# Patient Record
Sex: Female | Born: 1960 | Race: Black or African American | Hispanic: No | Marital: Single | Smoking: Former smoker
Health system: Southern US, Community
[De-identification: ages and names within clinical notes are randomized; demographics above are authoritative.]

## PROBLEM LIST (undated history)

## (undated) DIAGNOSIS — J449 Chronic obstructive pulmonary disease, unspecified: Secondary | ICD-10-CM

## (undated) DIAGNOSIS — E119 Type 2 diabetes mellitus without complications: Secondary | ICD-10-CM

## (undated) DIAGNOSIS — K2100 Gastro-esophageal reflux disease with esophagitis, without bleeding: Secondary | ICD-10-CM

## (undated) DIAGNOSIS — E7800 Pure hypercholesterolemia, unspecified: Secondary | ICD-10-CM

## (undated) DIAGNOSIS — C3490 Malignant neoplasm of unspecified part of unspecified bronchus or lung: Secondary | ICD-10-CM

## (undated) DIAGNOSIS — I10 Essential (primary) hypertension: Secondary | ICD-10-CM

## (undated) DIAGNOSIS — I509 Heart failure, unspecified: Secondary | ICD-10-CM

## (undated) DIAGNOSIS — J45909 Unspecified asthma, uncomplicated: Secondary | ICD-10-CM

---

## 2003-04-02 ENCOUNTER — Emergency Department (HOSPITAL_COMMUNITY): Admission: EM | Admit: 2003-04-02 | Discharge: 2003-04-02 | Payer: Self-pay | Admitting: Emergency Medicine

## 2004-12-19 ENCOUNTER — Ambulatory Visit: Payer: Self-pay | Admitting: Obstetrics and Gynecology

## 2006-08-28 ENCOUNTER — Ambulatory Visit: Payer: Self-pay | Admitting: Family Medicine

## 2007-09-10 ENCOUNTER — Ambulatory Visit: Payer: Self-pay | Admitting: Family Medicine

## 2007-09-20 ENCOUNTER — Ambulatory Visit: Payer: Self-pay | Admitting: Internal Medicine

## 2007-09-28 ENCOUNTER — Other Ambulatory Visit: Payer: Self-pay

## 2007-09-28 ENCOUNTER — Inpatient Hospital Stay: Payer: Self-pay | Admitting: Internal Medicine

## 2007-10-21 ENCOUNTER — Ambulatory Visit: Payer: Self-pay | Admitting: Internal Medicine

## 2011-04-08 ENCOUNTER — Ambulatory Visit: Payer: Self-pay | Admitting: Unknown Physician Specialty

## 2011-04-10 LAB — PATHOLOGY REPORT

## 2012-10-30 ENCOUNTER — Ambulatory Visit: Payer: Self-pay | Admitting: Family Medicine

## 2012-12-12 ENCOUNTER — Emergency Department: Payer: Self-pay | Admitting: Emergency Medicine

## 2012-12-12 LAB — BASIC METABOLIC PANEL
Anion Gap: 4 — ABNORMAL LOW (ref 7–16)
BUN: 8 mg/dL (ref 7–18)
Chloride: 103 mmol/L (ref 98–107)
Co2: 31 mmol/L (ref 21–32)
Creatinine: 0.92 mg/dL (ref 0.60–1.30)
EGFR (Non-African Amer.): >60
Glucose: 164 mg/dL — ABNORMAL HIGH (ref 65–99)
Sodium: 138 mmol/L (ref 136–145)

## 2012-12-12 LAB — CBC
HCT: 43.5 % (ref 35.0–47.0)
HGB: 14.1 g/dL (ref 12.0–16.0)
MCHC: 32.3 g/dL (ref 32.0–36.0)
Platelet: 274 10*3/uL (ref 150–440)
RBC: 5.11 10*6/uL (ref 3.80–5.20)
RDW: 15.1 % — ABNORMAL HIGH (ref 11.5–14.5)
WBC: 9.5 10*3/uL (ref 3.6–11.0)

## 2012-12-12 LAB — TROPONIN I: Troponin-I: <0.02 ng/mL

## 2013-06-10 ENCOUNTER — Inpatient Hospital Stay: Payer: Self-pay | Admitting: Internal Medicine

## 2013-06-10 LAB — COMPREHENSIVE METABOLIC PANEL
Albumin: 3.4 g/dL (ref 3.4–5.0)
Alkaline Phosphatase: 106 U/L (ref 50–136)
BUN: 16 mg/dL (ref 7–18)
Bilirubin,Total: 0.4 mg/dL (ref 0.2–1.0)
Chloride: 101 mmol/L (ref 98–107)
Co2: 32 mmol/L (ref 21–32)
Creatinine: 0.83 mg/dL (ref 0.60–1.30)
EGFR (African American): >60
Glucose: 123 mg/dL — ABNORMAL HIGH (ref 65–99)
Osmolality: 275 (ref 275–301)
Potassium: 3.9 mmol/L (ref 3.5–5.1)
SGOT(AST): 41 U/L — ABNORMAL HIGH (ref 15–37)
SGPT (ALT): 62 U/L (ref 12–78)
Total Protein: 7.8 g/dL (ref 6.4–8.2)

## 2013-06-10 LAB — PROTIME-INR
INR: 3.6
Prothrombin Time: 34.5 secs — ABNORMAL HIGH (ref 11.5–14.7)

## 2013-06-10 LAB — CBC
HCT: 46.4 % (ref 35.0–47.0)
HGB: 14.9 g/dL (ref 12.0–16.0)
MCH: 26.8 pg (ref 26.0–34.0)
Platelet: 318 10*3/uL (ref 150–440)
RBC: 5.55 10*6/uL — ABNORMAL HIGH (ref 3.80–5.20)
RDW: 15.3 % — ABNORMAL HIGH (ref 11.5–14.5)
WBC: 9.9 10*3/uL (ref 3.6–11.0)

## 2013-06-10 LAB — TROPONIN I
Troponin-I: 0.03 ng/mL
Troponin-I: <0.02 ng/mL

## 2013-06-10 LAB — CK TOTAL AND CKMB (NOT AT ARMC)
CK, Total: 168 U/L (ref 21–215)
CK, Total: 142 U/L (ref 21–215)
CK, Total: 132 U/L (ref 21–215)
CK-MB: 6.6 ng/mL — ABNORMAL HIGH (ref 0.5–3.6)

## 2013-06-11 LAB — CBC WITH DIFFERENTIAL/PLATELET
Basophil %: 0.4 %
Eosinophil #: 0 10*3/uL (ref 0.0–0.7)
Eosinophil %: 0.1 %
HCT: 44.8 % (ref 35.0–47.0)
HGB: 14.2 g/dL (ref 12.0–16.0)
Lymphocyte #: 2.4 10*3/uL (ref 1.0–3.6)
MCH: 26.5 pg (ref 26.0–34.0)
MCV: 84 fL (ref 80–100)
Monocyte #: 0.8 x10 3/mm (ref 0.2–0.9)
Monocyte %: 6.7 %
Neutrophil %: 71.5 %
Platelet: 291 10*3/uL (ref 150–440)
RBC: 5.35 10*6/uL — ABNORMAL HIGH (ref 3.80–5.20)
RDW: 15.3 % — ABNORMAL HIGH (ref 11.5–14.5)
WBC: 11.3 10*3/uL — ABNORMAL HIGH (ref 3.6–11.0)

## 2013-06-11 LAB — LIPID PANEL
Ldl Cholesterol, Calc: 99 mg/dL (ref 0–100)
Triglycerides: 93 mg/dL (ref 0–200)
VLDL Cholesterol, Calc: 19 mg/dL (ref 5–40)

## 2013-06-11 LAB — MAGNESIUM: Magnesium: 1.7 mg/dL — ABNORMAL LOW

## 2013-06-11 LAB — PROTIME-INR
INR: 3.5
Prothrombin Time: 33.6 secs — ABNORMAL HIGH (ref 11.5–14.7)

## 2013-06-11 LAB — BASIC METABOLIC PANEL
BUN: 19 mg/dL — ABNORMAL HIGH (ref 7–18)
Calcium, Total: 8.9 mg/dL (ref 8.5–10.1)
Chloride: 97 mmol/L — ABNORMAL LOW (ref 98–107)
EGFR (African American): >60
EGFR (Non-African Amer.): >60
Osmolality: 276 (ref 275–301)

## 2013-06-11 LAB — HEMOGLOBIN A1C: Hemoglobin A1C: 8 % — ABNORMAL HIGH (ref 4.2–6.3)

## 2013-06-12 LAB — PROTIME-INR
INR: 2.1
Prothrombin Time: 23.2 secs — ABNORMAL HIGH (ref 11.5–14.7)

## 2013-06-13 LAB — PROTIME-INR: Prothrombin Time: 18.6 secs — ABNORMAL HIGH (ref 11.5–14.7)

## 2014-10-23 ENCOUNTER — Emergency Department
Admit: 2014-10-23 | Disposition: A | Discharge status: Other Acute Inpatient Hospital | Payer: Self-pay | Admitting: Emergency Medicine

## 2014-10-23 LAB — BASIC METABOLIC PANEL
ANION GAP: 14 (ref 7–16)
BUN: 15 mg/dL
CALCIUM: 9.8 mg/dL
CHLORIDE: 94 mmol/L — AB
CO2: 26 mmol/L
Creatinine: 0.84 mg/dL
EGFR (African American): >60
GLUCOSE: 232 mg/dL — AB
Potassium: 4.9 mmol/L
Sodium: 134 mmol/L — ABNORMAL LOW

## 2014-10-23 LAB — APTT: Activated PTT: 58.7 secs — ABNORMAL HIGH (ref 23.6–35.9)

## 2014-10-23 LAB — CBC
HCT: 45.9 % (ref 35.0–47.0)
HGB: 14.4 g/dL (ref 12.0–16.0)
MCH: 28 pg (ref 26.0–34.0)
MCHC: 31.3 g/dL — ABNORMAL LOW (ref 32.0–36.0)
MCV: 90 fL (ref 80–100)
PLATELETS: 306 10*3/uL (ref 150–440)
RBC: 5.13 10*6/uL (ref 3.80–5.20)
RDW: 14.2 % (ref 11.5–14.5)
WBC: 15.4 10*3/uL — ABNORMAL HIGH (ref 3.6–11.0)

## 2014-10-23 LAB — URINALYSIS, COMPLETE
BACTERIA: NONE SEEN
Bilirubin,UR: NEGATIVE
Blood: NEGATIVE
Glucose,UR: 150 mg/dL (ref 0–75)
Ketone: NEGATIVE
Leukocyte Esterase: NEGATIVE
NITRITE: NEGATIVE
Ph: 5 (ref 4.5–8.0)
Specific Gravity: 1.03 (ref 1.003–1.030)

## 2014-10-23 LAB — PROTIME-INR
INR: 3
PROTHROMBIN TIME: 31 s — AB

## 2014-10-23 LAB — LIPASE, BLOOD: Lipase: 49 U/L

## 2014-10-23 LAB — TROPONIN I: Troponin-I: 0.03 ng/mL

## 2014-10-23 LAB — HEPATIC FUNCTION PANEL A (ARMC)
Albumin: 3.9 g/dL
Alkaline Phosphatase: 116 U/L
Bilirubin, Direct: 0.1 mg/dL
SGOT(AST): 42 U/L — ABNORMAL HIGH
SGPT (ALT): 35 U/L
Total Protein: 9.2 g/dL — ABNORMAL HIGH

## 2014-10-25 LAB — URINE CULTURE

## 2014-10-28 LAB — CULTURE, BLOOD (SINGLE)

## 2014-11-11 NOTE — Discharge Summary (Signed)
PATIENT NAME:  Laura Booth, Laura Booth MR#:  [id number] DATE OF BIRTH:  02-02-1961  DATE OF ADMISSION:  06/10/2013 DATE OF DISCHARGE:  06/13/2013  PRIMARY CARE PHYSICIAN: Dr. Lovie Macadamia.   DISCHARGE DIAGNOSES: 1.  Chronic obstructive pulmonary disease exacerbation.  2.  Acute on chronic congestive heart failure, systolic dysfunction with ejection fraction 30% to 35%.  3.  Accelerated hypertension.   CONDITION: Stable.   CODE STATUS: Full code.   HOME MEDICATIONS: Please refer to the [facility] discharge instruction medication reconciliation list.   DIET: Low-sodium, low-fat diet.   ACTIVITY: As tolerated.   FOLLOW-UP CARE: Follow with PCP within 1 to 2 weeks. Also, the patient needs to follow up with Dr. Nehemiah Massed within 1 to 2 weeks for congestive heart failure.   REASON FOR ADMISSION: Shortness of breath.   HOSPITAL COURSE: The patient is a 54 year old African American female with a history of chronic obstructive pulmonary disease, hypertension, congestive heart failure, who presented to the ED for shortness of breath. In addition, the patient's blood pressure was high at 220/130, oxygen saturation 91% in room air. For detailed history and physical examination, please refer to the admission note dictated by Dr. Vianne Bulls. On admission date, laboratory data as follows: WBC 9.9, hemoglobin 14.9, platelets 318. Electrolytes normal, BUN 16, creatinine 0.82. Chest x-ray showed mild acute infiltrate with no failure signs. BNP 3302.   ASSESSMENT: 1.  Chronic obstructive pulmonary disease exacerbation. The patient has been treated with Rocephin and Zithromax, Solu-Medrol, oxygen, Spiriva. The patient's symptoms have much improved. She has no complaints. Physical examination only showed bilateral mild wheezing. The patient's vital signs are stable.  2.  Mild acute systolic congestive heart failure. The patient's ejection fraction is 25% to 35%. Patient has been treated with Lasix, lisinopril and Lopressor. The  patient has no leg edema. Her symptoms have much improved.  3.  History of the left ventricular thrombosis. The patient is on Coumadin. INR is therapeutic.  4.  Accelerated hypertension secondary to respiratory distress. Patient's blood pressure has been under control after admission with lisinopril, Lasix and Lopressor.  5.  The patient has no complaints.   PHYSICAL EXAMINATION: Unremarkable.   The patient is clinically stable and will be discharged to home today. I discussed the patient's discharge plan with the patient and nurse.   TIME SPENT: About 36 minutes.    ____________________________ Demetrios Loll, MD qc:cg D: 06/13/2013 13:48:12 ET T: 06/14/2013 01:18:38 ET JOB#: [id number]  cc: Demetrios Loll, MD, <Dictator> Demetrios Loll MD ELECTRONICALLY SIGNED 06/14/2013 15:22

## 2014-11-11 NOTE — H&P (Signed)
PATIENT NAME:  Laura Booth, Laura Booth MR#:  [id number] DATE OF BIRTH:  [birth year]  DATE OF ADMISSION:  06/10/2013  PRIMARY DOCTOR: Dr. Lovie Macadamia.   EMERGENCY ROOM PHYSICIAN: Dr. Winfred Leeds.   CHIEF COMPLAINT: Shortness of breath.   HISTORY OF PRESENT ILLNESS: The patient is a 54 year old female patient who came in because of shortness of breath. The patient is treated for bronchitis with doxycycline and prednisone. The patient went to urgent care on Saturday and was given doxycycline and Medrol Dosepak. The patient taking doxycycline 100 mg p.o. b.i.d. since Saturday.   Her symptoms of shortness of breath have gotten worse this morning associated with cough, so she came to Emergency Room. In the ER, the patient's blood pressure was initially 220/130. O2 saturations were 91% on room air. The patient received a series of nebulizer treatments in the ER, and we are asked to admit the patient because of COPD exacerbation and possible CHF exacerbation.   The patient was seen at the bedside, complains of shortness of breath since Saturday, getting worse despite the antibiotics, and also has dry cough, unable to get the phlegm out and the patient denies any exertional dyspnea. Denies any chest pain. No nausea. No vomiting. No abdominal pain. The patient's O2 sats dropped to 84% on room air when she went to use the bathroom, then quickly went up to 94% on 2 liters.   The patient also received a dose of Lasix 40 mg IV and feels better now.   PAST MEDICAL HISTORY: Significant for a history of hypertension, COPD, seasonal allergies, history of left ventricular thrombus, GERD, and the patient had diagnosis of left ventricular thrombus in 2009; since then, she is on Coumadin for that. Follows up with Dr. Nehemiah Massed. No history of diabetes. No thyroid problems.   ALLERGIES: NO ALLERGIES EXCEPT SEASONAL ALLERGIES.   SOCIAL HISTORY: Smoking for over 20 years, and she is in the process of quitting, but she now smokes  about 5 cigarettes a day. Occasional alcohol, no drugs. Works at [employer]   FAMILY HISTORY: The mother had died of diabetic complications at age 44. The father died of pneumonia at age 64. He also had heart attack.   MEDICATIONS:  1.  Advair Diskus 250/50 one puff b.i.d. 2.  Albuterol inhalation 4 times daily.  3.  Allegra 180 mg p.o. daily.  4.  Deltasone taper pack.  5.  Doxycycline 100 mg p.o. b.i.d. started on Saturday, that is, November 15.  6.  Lisinopril 10 mg p.o. b.i.d. 7.  Flonase 50 mcg 2 sprays once a day.  8.  Omeprazole 20 mg p.o. daily.  9.  Singulair 10 mg p.o. daily.  10.  Spiriva 18 mcg inhalation daily.  11.  Coumadin 5 mg on Wednesday and Sunday and 7.5 mg the rest of the days.   PAST SURGICAL HISTORY: None.   REVIEW OF SYSTEMS: The patient says she gained some weight recently after starting steroids. No fever. No chills. No night sweats.  EYES: Wears glasses. No eye pain. No eye strain.  ENT: No hearing loss. No epistaxis. No difficulty swallowing. Does have some hoarse voice.  CARDIOVASCULAR: No chest pain. No palpitations.  RESPIRATORY:  Does have some cough and shortness of breath. Denies any hemoptysis.  GENITOURINARY: No dysuria.  GASTROINTESTINAL: No nausea. No vomiting. No abdominal pain. No GERD. No constipation. No diarrhea.  ENDOCRINE: No polyuria or nocturia.  HEMATOLOGIC: No anemia.  INTEGUMENTARY: No skin rashes.  MUSCULOSKELETAL: No joint pains.  NEUROLOGIC: No numbness  or weakness.  PSYCHIATRIC: No anxiety or insomnia.   PHYSICAL EXAMINATION:  VITAL SIGNS: Temperature 98.6, heart rate 113, blood pressure 220/130 initially and repeat blood pressure during my visit 166/86 and heart rate 129. Sats 94% on 2 liters, dropped to 84 on exertion on room air.  GENERAL: An obese female not in distress, answering questions appropriately.  HEENT: Head normocephalic, atraumatic.  EYES: Pupils equally reacting to light. No conjunctival pallor. No scleral  icterus. Extraocular movements intact.  NOSE: No turbinate hypertrophy. No lesions.  EARS: No tympanic membrane, congestion. No drainage.  MOUTH: No lesions. No exudates.  NECK: Supple. No JVD. No carotid bruit. Normal range of motion.  RESPIRATORY: Good respiratory effort. The patient has bilateral expiratory wheezing present in all lung fields. Not using accessory muscles for respiration.  CARDIOVASCULAR: S1, S2 regular, tachycardic. The patient has no murmurs, and carotid pulse, femoral pulse, pedal pulse intact. No peripheral edema.  GASTROINTESTINAL: Abdomen is soft, obese.  ABDOMEN: Bowel sounds present, nontender, nondistended. No organomegaly.  MUSCULOSKELETAL: Able to move all extremities equally.  SKIN: Warm and moist.  NEUROLOGIC: Cranial nerves II through XII intact. Power 5/5 in upper and lower extremities. Sensation intact. DTRs 2+ bilaterally.  PSYCHIATRIC: Mood and affect are within normal limits.   LABORATORY DATA: WBC 9.9, hemoglobin 14.9, hematocrit 46.4, platelets 318. Electrolytes: Sodium 136, potassium 3.9, chloride 101, bicarb 32, BUN 16, creatinine 0.82, glucose 123. LFTs within normal limits.   Chest x-ray shows mild acute infiltrate and no failure signs.   The patient's troponin is slightly up at 0.03. BNP 3302.   EKG shows multiple PVCs with sinus rhythm, 98 beats per minute. The patient has sinus tachycardia initially and has some PVCs. Rate 98 beats per minute. No ST-T changes.   ASSESSMENT AND PLAN:  1.  The patient is a 54 year old female patient with shortness of breath and hypoxia on room air, symptoms of chronic obstructive pulmonary disease exacerbation admitted to hospitalist service. Continue Rocephin and Zithromax. The patient failed outpatient treatment. Continue Rocephin and Zithromax, IV Solu-Medrol, continue oxygen. As she is already on Spiriva, continue that and stressed about quitting smoking. She said she is in the process. Counseled about 5  minutes, offered assistance.   2.  Mild congestive heart failure exacerbation. The patient had history of left ventricular thrombus before and she is on Coumadin for that. Obtain an echocardiogram. At this time, continue aspirin and also ACE inhibitors along with Coumadin and I have started on small dose Lasix and she is tachycardic and hypertensive on admission. Symptoms are likely related to respiratory distress. We are going to continue small-dose beta blockers and replace magnesium.   3.  Premature ventricular contractions due respiratory distress. Continue to monitor on telemetry.   4.  Accelerated hypertension, likely secondary to respiratory distress from chronic obstructive pulmonary disease flare. The patient will have lisinopril along with Lasix and metoprolol that I have added and will monitor the blood pressure response on telemetry.   5.  Seasonal allergies. Continue Singulair and Flonase and Allegra.   6.  Gastrointestinal prophylaxis and the patient is already on Coumadin for deep vein thrombosis prophylaxis. Will consult cardiology for mildly elevated troponins and also possible congestive heart failure exacerbation.  ____________________________ Epifanio Lesches, MD sk:np D: 06/10/2013 13:35:00 ET T: 06/10/2013 15:00:21 ET JOB#: [id number]  cc: Epifanio Lesches, MD, <Dictator> Epifanio Lesches MD ELECTRONICALLY SIGNED 06/30/2013 22:36

## 2015-11-02 ENCOUNTER — Other Ambulatory Visit: Payer: Self-pay | Admitting: Family Medicine

## 2015-11-02 DIAGNOSIS — Z1231 Encounter for screening mammogram for malignant neoplasm of breast: Secondary | ICD-10-CM

## 2015-11-15 ENCOUNTER — Ambulatory Visit: Payer: Self-pay

## 2016-01-25 ENCOUNTER — Ambulatory Visit
Admission: RE | Admit: 2016-01-25 | Discharge: 2016-01-25 | Disposition: A | Discharge status: Home / Self Care | Payer: Managed Care, Other (non HMO) | Source: Ambulatory Visit | Attending: Family Medicine | Admitting: Family Medicine

## 2016-01-25 DIAGNOSIS — Z1231 Encounter for screening mammogram for malignant neoplasm of breast: Secondary | ICD-10-CM | POA: Diagnosis present

## 2017-09-19 ENCOUNTER — Other Ambulatory Visit: Payer: Self-pay

## 2017-09-19 ENCOUNTER — Inpatient Hospital Stay
Admission: EM | Admit: 2017-09-19 | Discharge: 2017-09-22 | DRG: 871 | Disposition: A | Discharge status: Home / Self Care | Payer: Commercial Managed Care - PPO | Attending: Internal Medicine | Admitting: Internal Medicine

## 2017-09-19 ENCOUNTER — Encounter: Payer: Self-pay | Admitting: Emergency Medicine

## 2017-09-19 ENCOUNTER — Emergency Department: Payer: Commercial Managed Care - PPO

## 2017-09-19 DIAGNOSIS — Z7901 Long term (current) use of anticoagulants: Secondary | ICD-10-CM | POA: Diagnosis not present

## 2017-09-19 DIAGNOSIS — R0602 Shortness of breath: Secondary | ICD-10-CM

## 2017-09-19 DIAGNOSIS — J181 Lobar pneumonia, unspecified organism: Secondary | ICD-10-CM | POA: Diagnosis present

## 2017-09-19 DIAGNOSIS — F17200 Nicotine dependence, unspecified, uncomplicated: Secondary | ICD-10-CM | POA: Diagnosis present

## 2017-09-19 DIAGNOSIS — E119 Type 2 diabetes mellitus without complications: Secondary | ICD-10-CM | POA: Diagnosis present

## 2017-09-19 DIAGNOSIS — Z7984 Long term (current) use of oral hypoglycemic drugs: Secondary | ICD-10-CM | POA: Diagnosis not present

## 2017-09-19 DIAGNOSIS — J9601 Acute respiratory failure with hypoxia: Secondary | ICD-10-CM | POA: Diagnosis present

## 2017-09-19 DIAGNOSIS — A419 Sepsis, unspecified organism: Secondary | ICD-10-CM | POA: Diagnosis not present

## 2017-09-19 DIAGNOSIS — Z79899 Other long term (current) drug therapy: Secondary | ICD-10-CM

## 2017-09-19 DIAGNOSIS — I509 Heart failure, unspecified: Secondary | ICD-10-CM | POA: Diagnosis present

## 2017-09-19 DIAGNOSIS — J189 Pneumonia, unspecified organism: Secondary | ICD-10-CM

## 2017-09-19 DIAGNOSIS — E7800 Pure hypercholesterolemia, unspecified: Secondary | ICD-10-CM | POA: Diagnosis present

## 2017-09-19 DIAGNOSIS — I110 Hypertensive heart disease with heart failure: Secondary | ICD-10-CM | POA: Diagnosis present

## 2017-09-19 DIAGNOSIS — J441 Chronic obstructive pulmonary disease with (acute) exacerbation: Secondary | ICD-10-CM | POA: Diagnosis present

## 2017-09-19 DIAGNOSIS — Z888 Allergy status to other drugs, medicaments and biological substances status: Secondary | ICD-10-CM | POA: Diagnosis not present

## 2017-09-19 DIAGNOSIS — J440 Chronic obstructive pulmonary disease with acute lower respiratory infection: Secondary | ICD-10-CM | POA: Diagnosis present

## 2017-09-19 DIAGNOSIS — R0902 Hypoxemia: Secondary | ICD-10-CM

## 2017-09-19 DIAGNOSIS — K210 Gastro-esophageal reflux disease with esophagitis: Secondary | ICD-10-CM | POA: Diagnosis present

## 2017-09-19 HISTORY — DX: Gastro-esophageal reflux disease with esophagitis: K21.0

## 2017-09-19 HISTORY — DX: Chronic obstructive pulmonary disease, unspecified: J44.9

## 2017-09-19 HISTORY — DX: Heart failure, unspecified: I50.9

## 2017-09-19 HISTORY — DX: Pure hypercholesterolemia, unspecified: E78.00

## 2017-09-19 HISTORY — DX: Gastro-esophageal reflux disease with esophagitis, without bleeding: K21.00

## 2017-09-19 HISTORY — DX: Essential (primary) hypertension: I10

## 2017-09-19 HISTORY — DX: Type 2 diabetes mellitus without complications: E11.9

## 2017-09-19 LAB — BASIC METABOLIC PANEL
Anion gap: 13 (ref 5–15)
BUN: 23 mg/dL — ABNORMAL HIGH (ref 6–20)
CO2: 23 mmol/L (ref 22–32)
Calcium: 9.3 mg/dL (ref 8.9–10.3)
Chloride: 96 mmol/L — ABNORMAL LOW (ref 101–111)
Creatinine, Ser: 1.11 mg/dL — ABNORMAL HIGH (ref 0.44–1.00)
GFR calc Af Amer: >60 mL/min (ref >60)
GFR calc non Af Amer: 54 mL/min — ABNORMAL LOW (ref >60)
GLUCOSE: 124 mg/dL — AB (ref 65–99)
POTASSIUM: 4.7 mmol/L (ref 3.5–5.1)
Sodium: 132 mmol/L — ABNORMAL LOW (ref 135–145)

## 2017-09-19 LAB — CBC
HCT: 38.4 % (ref 35.0–47.0)
HEMOGLOBIN: 12.4 g/dL (ref 12.0–16.0)
MCH: 28.1 pg (ref 26.0–34.0)
MCHC: 32.2 g/dL (ref 32.0–36.0)
MCV: 87.2 fL (ref 80.0–100.0)
Platelets: 379 10*3/uL (ref 150–440)
RBC: 4.41 MIL/uL (ref 3.80–5.20)
RDW: 14.2 % (ref 11.5–14.5)
WBC: 12.8 10*3/uL — ABNORMAL HIGH (ref 3.6–11.0)

## 2017-09-19 LAB — TROPONIN I: Troponin I: 0.04 ng/mL (ref <0.03)

## 2017-09-19 LAB — PROTIME-INR
INR: 6.35
PROTHROMBIN TIME: 55.5 s — AB (ref 11.4–15.2)

## 2017-09-19 LAB — INFLUENZA PANEL BY PCR (TYPE A & B)
Influenza A By PCR: NEGATIVE
Influenza B By PCR: NEGATIVE

## 2017-09-19 LAB — GLUCOSE, CAPILLARY
Glucose-Capillary: 211 mg/dL — ABNORMAL HIGH (ref 65–99)
Glucose-Capillary: 145 mg/dL — ABNORMAL HIGH (ref 65–99)

## 2017-09-19 LAB — LACTIC ACID, PLASMA: LACTIC ACID, VENOUS: 1 mmol/L (ref 0.5–1.9)

## 2017-09-19 MED ORDER — SODIUM CHLORIDE 0.9 % IV SOLN
1.0000 g | INTRAVENOUS | Status: DC
  Administered 2017-09-20 – 2017-09-21 (×2): 1 g via INTRAVENOUS
  Filled 2017-09-19 (×4): qty 10

## 2017-09-19 MED ORDER — METHYLPREDNISOLONE SODIUM SUCC 125 MG IJ SOLR: 60.0000 mg | Freq: Four times a day (QID) | INTRAMUSCULAR | Status: DC

## 2017-09-19 MED ORDER — SODIUM CHLORIDE 0.9% FLUSH: 3.0000 mL | INTRAVENOUS | Status: DC | PRN

## 2017-09-19 MED ORDER — DILTIAZEM HCL 60 MG PO TABS
60.0000 mg | ORAL_TABLET | Freq: Three times a day (TID) | ORAL | Status: DC
  Administered 2017-09-19: 17:00:00 60 mg via ORAL
  Filled 2017-09-19: qty 2
  Filled 2017-09-19: qty 1
  Filled 2017-09-19: qty 2
  Filled 2017-09-19 (×3): qty 1

## 2017-09-19 MED ORDER — LISINOPRIL 20 MG PO TABS: 40.0000 mg | ORAL_TABLET | Freq: Every day | ORAL | Status: DC

## 2017-09-19 MED ORDER — IPRATROPIUM-ALBUTEROL 0.5-2.5 (3) MG/3ML IN SOLN
3.0000 mL | RESPIRATORY_TRACT | Status: DC
  Administered 2017-09-19 – 2017-09-22 (×18): 3 mL via RESPIRATORY_TRACT
  Filled 2017-09-19 (×10): qty 3
  Filled 2017-09-19: qty 30
  Filled 2017-09-19 (×5): qty 3

## 2017-09-19 MED ORDER — SODIUM CHLORIDE 0.9 % IV SOLN
1.0000 g | Freq: Once | INTRAVENOUS | Status: AC
  Administered 2017-09-19: 1 g via INTRAVENOUS
  Filled 2017-09-19: qty 10

## 2017-09-19 MED ORDER — GUAIFENESIN-DM 100-10 MG/5ML PO SYRP
15.0000 mL | ORAL_SOLUTION | ORAL | Status: DC | PRN
  Filled 2017-09-19: qty 15

## 2017-09-19 MED ORDER — IPRATROPIUM-ALBUTEROL 0.5-2.5 (3) MG/3ML IN SOLN
3.0000 mL | Freq: Once | RESPIRATORY_TRACT | Status: AC
  Administered 2017-09-19: 3 mL via RESPIRATORY_TRACT
  Filled 2017-09-19: qty 3

## 2017-09-19 MED ORDER — METFORMIN HCL ER 750 MG PO TB24
750.0000 mg | ORAL_TABLET | Freq: Two times a day (BID) | ORAL | Status: DC
  Administered 2017-09-19 – 2017-09-22 (×6): 750 mg via ORAL
  Filled 2017-09-19 (×7): qty 1

## 2017-09-19 MED ORDER — BUDESONIDE 0.25 MG/2ML IN SUSP
0.2500 mg | Freq: Two times a day (BID) | RESPIRATORY_TRACT | Status: DC
  Administered 2017-09-19 – 2017-09-21 (×4): 0.25 mg via RESPIRATORY_TRACT
  Filled 2017-09-19 (×4): qty 2

## 2017-09-19 MED ORDER — INSULIN ASPART 100 UNIT/ML ~~LOC~~ SOLN
0.0000 [IU] | Freq: Three times a day (TID) | SUBCUTANEOUS | Status: DC
  Administered 2017-09-19 – 2017-09-20 (×2): 3 [IU] via SUBCUTANEOUS
  Administered 2017-09-20 (×2): 1 [IU] via SUBCUTANEOUS
  Administered 2017-09-21: 20:00:00 3 [IU] via SUBCUTANEOUS
  Administered 2017-09-21: 5 [IU] via SUBCUTANEOUS
  Administered 2017-09-21: 10:00:00 1 [IU] via SUBCUTANEOUS
  Administered 2017-09-21: 18:00:00 2 [IU] via SUBCUTANEOUS
  Administered 2017-09-22: 13:00:00 3 [IU] via SUBCUTANEOUS
  Administered 2017-09-22: 08:00:00 1 [IU] via SUBCUTANEOUS
  Filled 2017-09-19 (×11): qty 1

## 2017-09-19 MED ORDER — ORAL CARE MOUTH RINSE
15.0000 mL | Freq: Two times a day (BID) | OROMUCOSAL | Status: DC
  Administered 2017-09-19 – 2017-09-22 (×3): 15 mL via OROMUCOSAL

## 2017-09-19 MED ORDER — TIOTROPIUM BROMIDE MONOHYDRATE 18 MCG IN CAPS
18.0000 ug | ORAL_CAPSULE | Freq: Every day | RESPIRATORY_TRACT | Status: DC
  Administered 2017-09-20 – 2017-09-22 (×3): 18 ug via RESPIRATORY_TRACT
  Filled 2017-09-19: qty 5

## 2017-09-19 MED ORDER — MONTELUKAST SODIUM 10 MG PO TABS
10.0000 mg | ORAL_TABLET | Freq: Every day | ORAL | Status: DC
  Administered 2017-09-19 – 2017-09-22 (×4): 10 mg via ORAL
  Filled 2017-09-19 (×4): qty 1

## 2017-09-19 MED ORDER — NICOTINE 21 MG/24HR TD PT24
21.0000 mg | MEDICATED_PATCH | Freq: Every day | TRANSDERMAL | Status: DC
  Administered 2017-09-19 – 2017-09-22 (×4): 21 mg via TRANSDERMAL
  Filled 2017-09-19 (×4): qty 1

## 2017-09-19 MED ORDER — MAGNESIUM OXIDE 400 (241.3 MG) MG PO TABS
400.0000 mg | ORAL_TABLET | Freq: Two times a day (BID) | ORAL | Status: DC
  Administered 2017-09-20 – 2017-09-22 (×5): 400 mg via ORAL
  Filled 2017-09-19 (×5): qty 1

## 2017-09-19 MED ORDER — SODIUM CHLORIDE 0.9 % IV SOLN
500.0000 mg | Freq: Once | INTRAVENOUS | Status: AC
  Administered 2017-09-19: 500 mg via INTRAVENOUS
  Filled 2017-09-19: qty 500

## 2017-09-19 MED ORDER — ATORVASTATIN CALCIUM 20 MG PO TABS
40.0000 mg | ORAL_TABLET | Freq: Every day | ORAL | Status: DC
  Administered 2017-09-19 – 2017-09-22 (×4): 40 mg via ORAL
  Filled 2017-09-19 (×4): qty 2

## 2017-09-19 MED ORDER — SODIUM CHLORIDE 0.9 % IV SOLN: 250.0000 mL | INTRAVENOUS | Status: DC | PRN

## 2017-09-19 MED ORDER — LORATADINE 10 MG PO TABS
10.0000 mg | ORAL_TABLET | Freq: Every day | ORAL | Status: DC
  Administered 2017-09-20 – 2017-09-22 (×3): 10 mg via ORAL
  Filled 2017-09-19 (×4): qty 1

## 2017-09-19 MED ORDER — ACETAMINOPHEN 500 MG PO TABS
1000.0000 mg | ORAL_TABLET | Freq: Once | ORAL | Status: AC
  Administered 2017-09-19: 1000 mg via ORAL
  Filled 2017-09-19: qty 2

## 2017-09-19 MED ORDER — ACETAMINOPHEN 325 MG PO TABS
650.0000 mg | ORAL_TABLET | Freq: Four times a day (QID) | ORAL | Status: DC | PRN
  Administered 2017-09-21 – 2017-09-22 (×2): 650 mg via ORAL
  Filled 2017-09-19 (×2): qty 2

## 2017-09-19 MED ORDER — ONDANSETRON HCL 4 MG PO TABS: 4.0000 mg | ORAL_TABLET | Freq: Four times a day (QID) | ORAL | Status: DC | PRN

## 2017-09-19 MED ORDER — GUAIFENESIN ER 600 MG PO TB12: 600.0000 mg | ORAL_TABLET | Freq: Two times a day (BID) | ORAL | Status: DC

## 2017-09-19 MED ORDER — AZITHROMYCIN 500 MG IV SOLR
500.0000 mg | INTRAVENOUS | Status: DC
  Administered 2017-09-20 – 2017-09-21 (×2): 500 mg via INTRAVENOUS
  Filled 2017-09-19 (×4): qty 500

## 2017-09-19 MED ORDER — PANTOPRAZOLE SODIUM 40 MG PO TBEC
40.0000 mg | DELAYED_RELEASE_TABLET | Freq: Every day | ORAL | Status: DC
  Administered 2017-09-20 – 2017-09-22 (×3): 40 mg via ORAL
  Filled 2017-09-19 (×3): qty 1

## 2017-09-19 MED ORDER — SODIUM CHLORIDE 0.9 % IV BOLUS (SEPSIS)
1000.0000 mL | Freq: Once | INTRAVENOUS | Status: AC
  Administered 2017-09-19: 1000 mL via INTRAVENOUS

## 2017-09-19 MED ORDER — SODIUM CHLORIDE 0.9% FLUSH
3.0000 mL | Freq: Two times a day (BID) | INTRAVENOUS | Status: DC
  Administered 2017-09-19 – 2017-09-22 (×6): 3 mL via INTRAVENOUS

## 2017-09-19 MED ORDER — ONDANSETRON HCL 4 MG/2ML IJ SOLN: 4.0000 mg | Freq: Four times a day (QID) | INTRAMUSCULAR | Status: DC | PRN

## 2017-09-19 MED ORDER — ACETAMINOPHEN 650 MG RE SUPP: 650.0000 mg | Freq: Four times a day (QID) | RECTAL | Status: DC | PRN

## 2017-09-19 MED ORDER — GUAIFENESIN ER 600 MG PO TB12
600.0000 mg | ORAL_TABLET | Freq: Two times a day (BID) | ORAL | Status: DC
  Administered 2017-09-19 – 2017-09-22 (×6): 600 mg via ORAL
  Filled 2017-09-19 (×7): qty 1

## 2017-09-19 MED ORDER — METHYLPREDNISOLONE SODIUM SUCC 125 MG IJ SOLR
60.0000 mg | Freq: Four times a day (QID) | INTRAMUSCULAR | Status: DC
  Administered 2017-09-19 – 2017-09-22 (×11): 60 mg via INTRAVENOUS
  Filled 2017-09-19 (×11): qty 2

## 2017-09-19 MED ORDER — METHYLPREDNISOLONE SODIUM SUCC 125 MG IJ SOLR
125.0000 mg | Freq: Once | INTRAMUSCULAR | Status: AC
  Administered 2017-09-19: 125 mg via INTRAVENOUS
  Filled 2017-09-19: qty 2

## 2017-09-19 NOTE — ED Provider Notes (Addendum)
[facility] Emergency Department Provider Note  Time seen: 10:03 AM  I have reviewed the triage vital signs and the nursing notes.   HISTORY  Chief Complaint Chest Pain    HPI Laura Booth is a 57 y.o. female with a past medical history of CHF, COPD, diabetes, hypertension, hyperlipidemia presents to the emergency department for shortness of breath cough and congestion.  According to the patient for the past 1 month she has had a cough congestion and subjective fevers.  Saw her doctor went on a 2-week course of prednisone.  She states after she completed the prednisone she once again became very congested and short of breath.  Patient states her shortness of breath has progressively worsened over the past 1 week so she came to the emergency department for evaluation.  Upon arrival the patient has a room air saturation of 87%.  No home O2 requirement or prescription.  Patient states very mild chest tightness, denies any "pain."  Largely negative review of systems otherwise.   Past Medical History:  Diagnosis Date  . CHF (congestive heart failure) ([place])   . COPD (chronic obstructive pulmonary disease) ([place])   . Diabetes mellitus without complication ([place])   . Hypercholesteremia   . Hypertension   . Reflux esophagitis     There are no active problems to display for this patient.   History reviewed. No pertinent surgical history.  Prior to Admission medications   Not on File    Allergies  Allergen Reactions  . Other     Beta blocker     History reviewed. No pertinent family history.  Social History Social History   Tobacco Use  . Smoking status: Current Every Day Smoker  . Smokeless tobacco: Never Used  Substance Use Topics  . Alcohol use: No    Frequency: Never  . Drug use: No    Review of Systems Constitutional: Subjective fever/chills Eyes: Negative for visual complaints ENT: Congestion times 1 month Cardiovascular: Negative for  chest pain.  Occasional chest tightness Respiratory: Moderate shortness of breath, worse of the past 1 week Gastrointestinal: Negative for abdominal pain, vomiting Genitourinary: Negative for urinary compaints Musculoskeletal: Negative for leg pain or swelling Skin: Negative for skin complaints  Neurological: Negative for headache All other ROS negative  ____________________________________________   PHYSICAL EXAM:  VITAL SIGNS: ED Triage Vitals  Enc Vitals Group     BP 09/19/17 0952 128/85     Pulse Rate 09/19/17 0947 (!) 120     Resp 09/19/17 0952 (!) 24     Temp 09/19/17 0952 98.2 F (36.8 C)     Temp Source 09/19/17 0952 Oral     SpO2 09/19/17 0947 (!) 87 %     Weight 09/19/17 0948 185 lb (83.9 kg)     Height 09/19/17 0948 5\' 4"  (1.626 m)     Head Circumference --      Peak Flow --      Pain Score 09/19/17 0947 4     Pain Loc --      Pain Edu? --      Excl. in [place]? --    Constitutional: Alert and oriented. Well appearing and in no distress. Eyes: Normal exam ENT   Head: Normocephalic and atraumatic.   Mouth/Throat: Mucous membranes are moist. Cardiovascular: Regular rhythm, rate around 120 bpm.  No obvious murmur. Respiratory: Mild tachypnea, mild to moderate expiratory wheeze auscultated bilaterally.  No rales or rhonchi. Gastrointestinal: Soft and nontender. No distention.  Musculoskeletal: Nontender with normal range of motion in all extremities. No lower extremity tenderness or edema. Neurologic:  Normal speech and language. No gross focal neurologic deficits  Skin:  Skin is warm, dry and intact.  Psychiatric: Mood and affect are normal.  ____________________________________________    EKG  EKG reviewed and interpreted by myself shows sinus tachycardia 120 bpm with a narrow QRS, left axis deviation, largely normal intervals, nonspecific ST changes.  No ST elevation.  ____________________________________________    RADIOLOGY  X-ray consistent  with left upper lobe pneumonia.  ____________________________________________   INITIAL IMPRESSION / ASSESSMENT AND PLAN / ED COURSE  Pertinent labs & imaging results that were available during my care of the patient were reviewed by me and considered in my medical decision making (see chart for details).  Patient presents to the emergency department for worsening shortness of breath cough and congestion.  Differential would include URI, bronchitis, pneumonia, COPD exacerbation, CHF exacerbation.  We will check labs, chest x-ray, cardiac panel.  We will treat with DuoNeb's, Solu-Medrol and continue to closely monitor in the emergency department while awaiting results.  Patient currently satting 92-94% on 2 L but no home O2 requirement.  We will reassess after breathing treatments.  X-ray consistent with left upper lobe pneumonia.  Patient remains tachycardic around 120 bpm.  Remains hypoxic in the upper 80s on room air, 92% currently on 2 L.  White blood cell count has resulted at 12,800.  Given the elevated white blood cell count, tachycardia, hypoxia and left upper lobe pneumonia I have ordered sepsis protocols, started community-acquired pneumonia antibiotics as well as IV hydration.  Patient will require admission to the hospital once the remainder of her lab work has resulted.  I discussed plan of care with the patient who is agreeable.  Troponin 0.04, could be demand ischemia related.  Influenza negative.  Patient admitted to the hospitalist service.  CRITICAL CARE Performed by: Harvest Dark   Total critical care time: 30 minutes  Critical care time was exclusive of separately billable procedures and treating other patients.  Critical care was necessary to treat or prevent imminent or life-threatening deterioration.  Critical care was time spent personally by me on the following activities: development of treatment plan with patient and/or surrogate as well as nursing,  discussions with consultants, evaluation of patient's response to treatment, examination of patient, obtaining history from patient or surrogate, ordering and performing treatments and interventions, ordering and review of laboratory studies, ordering and review of radiographic studies, pulse oximetry and re-evaluation of patient's condition.   ____________________________________________   FINAL CLINICAL IMPRESSION(S) / ED DIAGNOSES  Hypoxia Dyspnea Sepsis Pneumonia   Harvest Dark, MD 09/19/17 1237    Harvest Dark, MD 09/19/17 1237

## 2017-09-19 NOTE — ED Notes (Signed)
First nurse note  Presents from [facility] with c/p and SOB

## 2017-09-19 NOTE — H&P (Addendum)
[facility] at [facility] NAME: Laura Booth    MR#:  [id number]  DATE OF BIRTH:  1960-08-08  DATE OF ADMISSION:  09/19/2017  PRIMARY CARE PHYSICIAN: Patient, No Pcp Per   REQUESTING/REFERRING PHYSICIAN: Wyman Songster MD  CHIEF COMPLAINT:   Chief Complaint  Patient presents with  . Chest Pain    HISTORY OF PRESENT ILLNESS: Laura Booth  is a 57 y.o. female with a known history of congestive heart failure, COPD, diabetes type 2, hypertension, hyper lipidemia and GERD who also states that she has a history of cardiac thrombus presenting with shortness of breath ongoing for the past 1 month.  Patient states that she was treated with some prednisone in February but her symptoms continue to get worse.  She has been having progressive shortness of breath wheezing and coughing.  Has not had any fevers or chills.  In the ER she is noted to have left upper lobe pneumonia and new oxygen requirements. PAST MEDICAL HISTORY:   Past Medical History:  Diagnosis Date  . CHF (congestive heart failure) ([place])   . COPD (chronic obstructive pulmonary disease) ([place])   . Diabetes mellitus without complication ([place])   . Hypercholesteremia   . Hypertension   . Reflux esophagitis     PAST SURGICAL HISTORY: History reviewed. No pertinent surgical history.  SOCIAL HISTORY:  Social History   Tobacco Use  . Smoking status: Current Every Day Smoker  . Smokeless tobacco: Never Used  Substance Use Topics  . Alcohol use: No    Frequency: Never    FAMILY HISTORY: History reviewed. No pertinent family history.  DRUG ALLERGIES:  Allergies  Allergen Reactions  . Other     Beta blocker     REVIEW OF SYSTEMS:   CONSTITUTIONAL: No fever, fatigue or weakness.  EYES: No blurred or double vision.  EARS, NOSE, AND THROAT: No tinnitus or ear pain.  RESPIRATORY: Positive cough, positive shortness of breath, wheezing or hemoptysis.  CARDIOVASCULAR: No chest pain,  orthopnea, edema.  GASTROINTESTINAL: No nausea, vomiting, diarrhea or abdominal pain.  GENITOURINARY: No dysuria, hematuria.  ENDOCRINE: No polyuria, nocturia,  HEMATOLOGY: No anemia, easy bruising or bleeding SKIN: No rash or lesion. MUSCULOSKELETAL: No joint pain or arthritis.   NEUROLOGIC: No tingling, numbness, weakness.  PSYCHIATRY: No anxiety or depression.   MEDICATIONS AT HOME:  Prior to Admission medications   Medication Sig Start Date End Date Taking? Authorizing Provider  ADVAIR DISKUS 500-50 MCG/DOSE AEPB Inhale 1 puff into the lungs every 12 (twelve) hours. 08/25/17  Yes [provider]  atorvastatin (LIPITOR) 40 MG tablet Take 40 mg by mouth daily.    Yes [provider]  fexofenadine (ALLEGRA) 180 MG tablet Take 180 mg by mouth daily.   Yes [provider]  lisinopril (PRINIVIL,ZESTRIL) 40 MG tablet Take 40 mg by mouth daily. 07/23/17  Yes [provider]  magnesium oxide (MAG-OX) 400 MG tablet Take 1 tablet by mouth 2 (two) times daily. 09/04/17  Yes [provider]  metFORMIN (GLUCOPHAGE-XR) 750 MG 24 hr tablet Take 1 tablet by mouth 2 (two) times daily. 09/06/17  Yes [provider]  montelukast (SINGULAIR) 10 MG tablet Take 10 mg by mouth daily. 09/08/17  Yes [provider]  omeprazole (PRILOSEC) 20 MG capsule Take 20 mg by mouth daily.   Yes [provider]  PROAIR HFA 108 (90 Base) MCG/ACT inhaler Inhale 2 puffs into the lungs 4 (four) times daily as needed.  07/28/17  Yes [provider]  SPIRIVA HANDIHALER 18 MCG inhalation capsule Place 1 puff into inhaler and inhale daily. 08/25/17  Yes [provider]  WARFARIN SODIUM PO TAKE 10 MG BY MOUTH ON Monday AND 7.5MG  DAILY ALL OTHER DAYS   Yes [provider]      PHYSICAL EXAMINATION:   VITAL SIGNS: Blood pressure 124/77, pulse (!) 111, temperature 98.2 F (36.8 C), temperature source Oral, resp. rate 20, height 5\' 4"  (1.626 m),  weight 185 lb (83.9 kg), SpO2 93 %.  GENERAL:  57 y.o.-year-old patient lying in the bed with no acute distress.  EYES: Pupils equal, round, reactive to light and accommodation. No scleral icterus. Extraocular muscles intact.  HEENT: Head atraumatic, normocephalic. Oropharynx and nasopharynx clear.  NECK:  Supple, no jugular venous distention. No thyroid enlargement, no tenderness.  LUNGS: Bilateral wheezing throughout both lungs and accessory muscle usage CARDIOVASCULAR: S1, S2 normal. No murmurs, rubs, or gallops.  ABDOMEN: Soft, nontender, nondistended. Bowel sounds present. No organomegaly or mass.  EXTREMITIES: No pedal edema, cyanosis, or clubbing.  NEUROLOGIC: Cranial nerves II through XII are intact. Muscle strength 5/5 in all extremities. Sensation intact. Gait not checked.  PSYCHIATRIC: The patient is alert and oriented x 3.  SKIN: No obvious rash, lesion, or ulcer.   LABORATORY PANEL:   CBC Recent Labs  Lab 09/19/17 0959  WBC 12.8*  HGB 12.4  HCT 38.4  PLT 379  MCV 87.2  MCH 28.1  MCHC 32.2  RDW 14.2   ------------------------------------------------------------------------------------------------------------------  Chemistries  Recent Labs  Lab 09/19/17 0959  NA 132*  K 4.7  CL 96*  CO2 23  GLUCOSE 124*  BUN 23*  CREATININE 1.11*  CALCIUM 9.3   ------------------------------------------------------------------------------------------------------------------ estimated creatinine clearance is 58.6 mL/min (A) (by C-G formula based on SCr of 1.11 mg/dL (H)). ------------------------------------------------------------------------------------------------------------------ No results for input(s): TSH, T4TOTAL, T3FREE, THYROIDAB in the last 72 hours.  Invalid input(s): FREET3   Coagulation profile No results for input(s): INR, PROTIME in the last 168  hours. ------------------------------------------------------------------------------------------------------------------- No results for input(s): DDIMER in the last 72 hours. -------------------------------------------------------------------------------------------------------------------  Cardiac Enzymes Recent Labs  Lab 09/19/17 0959  TROPONINI 0.04*   ------------------------------------------------------------------------------------------------------------------ Invalid input(s): POCBNP  ---------------------------------------------------------------------------------------------------------------  Urinalysis    Component Value Date/Time   COLORURINE Amber 10/23/2014 1544   APPEARANCEUR Cloudy 10/23/2014 1544   LABSPEC 1.030 10/23/2014 1544   PHURINE 5.0 10/23/2014 1544   GLUCOSEU 150 mg/dL 10/23/2014 1544   HGBUR Negative 10/23/2014 1544   BILIRUBINUR Negative 10/23/2014 1544   KETONESUR Negative 10/23/2014 1544   PROTEINUR >=500 10/23/2014 1544   NITRITE Negative 10/23/2014 1544   LEUKOCYTESUR Negative 10/23/2014 1544     RADIOLOGY: Dg Chest 2 View  Result Date: 09/19/2017 CLINICAL DATA:  Shortness of breath. EXAM: CHEST  2 VIEW COMPARISON:  10/23/2014 FINDINGS: Airspace disease asymmetrically in the left upper lobe. Similar appearance of the left hilum when compared to prior and allowing for obscuration. No visible cavitation or effusion. Normal heart size. IMPRESSION: Left upper lobe airspace disease consistent with pneumonia in the appropriate clinical setting. Followup PA and lateral chest X-ray is recommended in 3-4 weeks following trial of antibiotic therapy to ensure resolution and exclude underlying malignancy. Electronically Signed   By: Monte Fantasia M.D.   On: 09/19/2017 10:24    EKG: Orders placed or performed during the hospital encounter of 09/19/17  . ED EKG within 10 minutes  . ED EKG within 10 minutes    IMPRESSION AND PLAN: Patient  is a  57 year old with history of COPD presenting with shortness of breath  1.  Acute respiratory failure due to pneumonia and COPD exasperation We will treat pneumonia with ceftriaxone and azithromycin Obtain sputum cultures  2.  Acute on chronic COPD exasperation we will treat patient with nebulizer therapy IV Solu-Medrol Place her on Pulmicort instead of Advair Place patient on mucolytic's  3.  Diabetes type 2 Continue Metformin Pl. sliding scale insulin  4 history of CHF type unknown.  Currently compensated  5.  Hyperlipidemia continue Lipitor  6.  Essential hypertension continue lisinopril  7. H/o cardiac thrombus- continue coumadin  8.  Nicotine abuse smoking cessation provided 4 min spent, recommended she stop smoking, nicotine patch  All the records are reviewed and case discussed with ED provider. Management plans discussed with the patient, family and they are in agreement.  CODE STATUS: Code Status History    This patient does not have a recorded code status. Please follow your organizational policy for patients in this situation.       TOTAL TIME TAKING CARE OF THIS PATIENT: 55 minutes.    Dustin Flock M.D on 09/19/2017 at 1:16 PM  Between 7am to 6pm - Pager - [phone]  After 6pm go to [email or web address] - password [email or web address] [facility]  Office  [phone]  CC: Primary care physician; Patient, No Pcp Per

## 2017-09-19 NOTE — Progress Notes (Addendum)
[facility] for Warfarin Indication: h/o cardiac thrombus  Allergies  Allergen Reactions  . Other Shortness Of Breath    Beta blocker     Patient Measurements: Height: 5\' 4"  (162.6 cm) Weight: 185 lb (83.9 kg) IBW/kg (Calculated) : 54.7  Vital Signs: Temp: 97.8 F (36.6 C) (03/01 1429) Temp Source: Oral (03/01 1429) BP: 133/82 (03/01 1429) Pulse Rate: 121 (03/01 1429)  Labs: Recent Labs    09/19/17 0959 09/19/17 1316  HGB 12.4  --   HCT 38.4  --   PLT 379  --   LABPROT  --  55.5*  INR  --  6.35*  CREATININE 1.11*  --   TROPONINI 0.04*  --     Estimated Creatinine Clearance: 58.6 mL/min (A) (by C-G formula based on SCr of 1.11 mg/dL (H)).   Medical History: Past Medical History:  Diagnosis Date  . CHF (congestive heart failure) ([place])   . COPD (chronic obstructive pulmonary disease) ([place])   . Diabetes mellitus without complication ([place])   . Hypercholesteremia   . Hypertension   . Reflux esophagitis    Assessment: 57 y/o F with a h/o cardiac thrombus on warfarin admitted with AECOPD. Patient taking warfarin 7.5 mg daily except 10 mg Mondays PTA.   Goal of Therapy:  INR 2-3   Assesment/Plan:  INR is elevated with no reported bleeding. Will hold warfarin and follow daily INR/CBC for now.   Ulice Dash D 09/19/2017,3:54 PM

## 2017-09-19 NOTE — Progress Notes (Signed)
   09/19/17 1815  Clinical Encounter Type  Visited With Patient and family together  Visit Type Initial  Referral From Nurse  Consult/Referral To Chaplain   Chaplain provided information regarding advanced directives.  [place]

## 2017-09-19 NOTE — ED Notes (Signed)
Dr. Kerman Passey aware of Troponin of 0.04

## 2017-09-19 NOTE — ED Notes (Signed)
Patient transported to X-ray 

## 2017-09-19 NOTE — Progress Notes (Signed)
CODE SEPSIS - PHARMACY COMMUNICATION  **Broad Spectrum Antibiotics should be administered within 1 hour of Sepsis diagnosis**  Time Code Sepsis Called/Page Received: 3/1 @ 1107  Antibiotics Ordered: Ceftriaxone 1g IV                                       Azithromycin 500mg  IV   Time of 1st antibiotic administration: 3/1 @1135   Additional action taken by pharmacy: N/A  If necessary, Name of Provider/Nurse Contacted: N/A   Pernell Dupre, PharmD, BCPS Clinical Pharmacist 09/19/2017 11:47 AM

## 2017-09-19 NOTE — ED Triage Notes (Signed)
Hx COPD without oxygen use at home and baseline sat 92 RA. sats 87 today. Mildly labored with left sided chest pain intermittent for week worse last night.

## 2017-09-19 NOTE — Progress Notes (Signed)
Ch responded to an order request. Nurses were caring for the Pt. Initital request was for CPR. Wanted AD.   09/19/17 1600  Clinical Encounter Type  Visited With Patient  Visit Type Initial  Referral From Nurse  Spiritual Encounters  Spiritual Needs Brochure

## 2017-09-20 LAB — BASIC METABOLIC PANEL
ANION GAP: 9 (ref 5–15)
BUN: 20 mg/dL (ref 6–20)
CHLORIDE: 101 mmol/L (ref 101–111)
CO2: 25 mmol/L (ref 22–32)
Calcium: 9.4 mg/dL (ref 8.9–10.3)
Creatinine, Ser: 0.69 mg/dL (ref 0.44–1.00)
GFR calc Af Amer: >60 mL/min (ref >60)
GLUCOSE: 197 mg/dL — AB (ref 65–99)
POTASSIUM: 5.5 mmol/L — AB (ref 3.5–5.1)
Sodium: 135 mmol/L (ref 135–145)

## 2017-09-20 LAB — PROTIME-INR
INR: 8.7
PROTHROMBIN TIME: 71 s — AB (ref 11.4–15.2)

## 2017-09-20 LAB — GLUCOSE, CAPILLARY
GLUCOSE-CAPILLARY: 122 mg/dL — AB (ref 65–99)
GLUCOSE-CAPILLARY: 227 mg/dL — AB (ref 65–99)
GLUCOSE-CAPILLARY: 147 mg/dL — AB (ref 65–99)
Glucose-Capillary: 178 mg/dL — ABNORMAL HIGH (ref 65–99)

## 2017-09-20 LAB — CBC
HCT: 37.1 % (ref 35.0–47.0)
HEMOGLOBIN: 11.9 g/dL — AB (ref 12.0–16.0)
MCH: 28 pg (ref 26.0–34.0)
MCHC: 32.1 g/dL (ref 32.0–36.0)
MCV: 87.2 fL (ref 80.0–100.0)
PLATELETS: 344 10*3/uL (ref 150–440)
RBC: 4.25 MIL/uL (ref 3.80–5.20)
RDW: 14.3 % (ref 11.5–14.5)
WBC: 14.4 10*3/uL — AB (ref 3.6–11.0)

## 2017-09-20 MED ORDER — AMLODIPINE BESYLATE 5 MG PO TABS
2.5000 mg | ORAL_TABLET | Freq: Every day | ORAL | Status: DC
  Administered 2017-09-20 – 2017-09-22 (×3): 2.5 mg via ORAL
  Filled 2017-09-20 (×3): qty 1

## 2017-09-20 NOTE — Progress Notes (Signed)
   09/20/17 1110  Clinical Encounter Type  Visited With Patient and family together  Visit Type Follow-up  Referral From Nurse  Consult/Referral To Chaplain  Spiritual Encounters  Spiritual Needs Other (Comment)   [place] followed up on a OR to complete a AD. [place] secured notary and witnesses and AD was completed and copy put in PT file and original given to PT.

## 2017-09-20 NOTE — Progress Notes (Signed)
CRITICAL VALUE ALERT  Critical Value:  INR 8.70  Date & Time Notied:  09/20/2017 0514  Provider Notified: Saundra Shelling, MD  Orders Received/Actions taken: No new orders at this time. Will continue to monitor.

## 2017-09-20 NOTE — Progress Notes (Signed)
[facility] for Warfarin Indication: h/o cardiac thrombus  Allergies  Allergen Reactions  . Other Shortness Of Breath    Beta blocker     Patient Measurements: Height: 5\' 4"  (162.6 cm) Weight: 185 lb (83.9 kg) IBW/kg (Calculated) : 54.7  Vital Signs: Temp: 98.2 F (36.8 C) (03/02 0425) Temp Source: Oral (03/02 0425) BP: 113/71 (03/02 0425) Pulse Rate: 101 (03/02 0425)  Labs: Recent Labs    09/19/17 0959 09/19/17 1316 09/20/17 0339  HGB 12.4  --  11.9*  HCT 38.4  --  37.1  PLT 379  --  344  LABPROT  --  55.5* 71.0*  INR  --  6.35* 8.70*  CREATININE 1.11*  --  0.69  TROPONINI 0.04*  --   --     Estimated Creatinine Clearance: 81.3 mL/min (by C-G formula based on SCr of 0.69 mg/dL).   Medical History: Past Medical History:  Diagnosis Date  . CHF (congestive heart failure) ([facility])   . COPD (chronic obstructive pulmonary disease) ([facility])   . Diabetes mellitus without complication ([facility])   . Hypercholesteremia   . Hypertension   . Reflux esophagitis    Assessment: 57 y/o F with a h/o cardiac thrombus on warfarin admitted with AECOPD. Patient taking warfarin 7.5 mg daily except 10 mg Mondays PTA.                           INR      Warfarin 3/1     6.35         --- 3/2     8.70         ---  Goal of Therapy:  INR 2-3   Assesment/Plan:  INR is elevated with no reported bleeding. Will hold warfarin and follow daily INR/CBC for now.   Olivia Canter, [facility] 09/20/2017,8:08 AM

## 2017-09-21 LAB — PROTIME-INR
INR: 3.86
Prothrombin Time: 37.6 seconds — ABNORMAL HIGH (ref 11.4–15.2)

## 2017-09-21 LAB — GLUCOSE, CAPILLARY
Glucose-Capillary: 150 mg/dL — ABNORMAL HIGH (ref 65–99)
Glucose-Capillary: 279 mg/dL — ABNORMAL HIGH (ref 65–99)
Glucose-Capillary: 165 mg/dL — ABNORMAL HIGH (ref 65–99)
Glucose-Capillary: 212 mg/dL — ABNORMAL HIGH (ref 65–99)

## 2017-09-21 MED ORDER — PREMIER PROTEIN SHAKE
11.0000 [oz_av] | Freq: Two times a day (BID) | ORAL | Status: DC
  Administered 2017-09-21 – 2017-09-22 (×2): 11 [oz_av] via ORAL

## 2017-09-21 MED ORDER — BUDESONIDE 0.25 MG/2ML IN SUSP
0.2500 mg | Freq: Two times a day (BID) | RESPIRATORY_TRACT | Status: DC
  Administered 2017-09-21 – 2017-09-22 (×2): 0.25 mg via RESPIRATORY_TRACT
  Filled 2017-09-21 (×2): qty 2

## 2017-09-21 NOTE — Progress Notes (Signed)
Initial Nutrition Assessment  DOCUMENTATION CODES:   Obesity unspecified  INTERVENTION:  Provide Premier Protein po BID, each supplement provides 160 kcal and 30 grams of protein.  NUTRITION DIAGNOSIS:   Inadequate oral intake related to decreased appetite as evidenced by per patient/family report.  GOAL:   Patient will meet greater than or equal to 90% of their needs  MONITOR:   PO intake, Supplement acceptance, Labs, Weight trends, I & O's  REASON FOR ASSESSMENT:   Malnutrition Screening Tool    ASSESSMENT:   57 year old female with PMHx of CHF, COPD, DM type 2, HTN, hypercholesteremia, reflux esophagitis, hx of cardiac thrombus who is admitted with acute respiratory failure due to PNA and acute exacerbation of COPD.   Met with patient at bedside. She reports she has had a decreased appetite for about one month now. She did not realize the symptoms she had been experiencing at home were from PNA. She had been feeling very weak, had some back pain, and just had not been eating well at home. She was only eating 1-2 small meals per day. When she is feeling well she eats 3 good meals daily. She does report her appetite is starting to pick back up now.  UBW was 200 lbs several years ago. She reports she has slowly lost weight to current weight of 185 lbs over several years. Unable to verify weight as patient was sitting in chair eating lunch and was not on bed.  Meal completion: 30% of dinner 3/1; on 3/2 80% of breakfast and 100% of lunch and dinner  Medications reviewed and include: Novolog 0-9 units TID, magnesium oxide 400 mg BID, metformin 750 mg BID, Solu-Medrol 60 mg Q6hrs IV, pantoprazole, azithromycin, ceftriaxone.  Labs reviewed: CBG 147-279, Potassium 5.5.  Patient does not meet criteria for malnutrition at this time.  NUTRITION - FOCUSED PHYSICAL EXAM:    Most Recent Value  Orbital Region  No depletion  Upper Arm Region  No depletion  Thoracic and Lumbar Region   No depletion  Buccal Region  No depletion  Temple Region  No depletion  Clavicle Bone Region  No depletion  Clavicle and Acromion Bone Region  No depletion  Scapular Bone Region  No depletion  Dorsal Hand  No depletion  Patellar Region  No depletion  Anterior Thigh Region  No depletion  Posterior Calf Region  No depletion  Edema (RD Assessment)  None  Hair  Reviewed  Eyes  Reviewed  Mouth  Reviewed  Skin  Reviewed  Nails  Reviewed     Diet Order:  Diet heart healthy/carb modified Room service appropriate? Yes; Fluid consistency: Thin  EDUCATION NEEDS:   No education needs have been identified at this time  Skin:  Skin Assessment: Reviewed RN Assessment  Last BM:  09/19/2017  Height:   Ht Readings from Last 1 Encounters:  09/19/17 5' 4"  (1.626 m)    Weight:   Wt Readings from Last 1 Encounters:  09/19/17 185 lb (83.9 kg)    Ideal Body Weight:  54.5 kg  BMI:  Body mass index is 31.76 kg/m.  Estimated Nutritional Needs:   Kcal:  1695-1840 (MSJ x 1.2-1.3)  Protein:  84-92 grams (1-1.1 grams/kg)  Fluid:  1.7-1.8 L/day (1 mL/kcal)  Willey Blade, MS, RD, LDN Office: [phone] Pager: [phone] After Hours/Weekend Pager: [phone]

## 2017-09-21 NOTE — Progress Notes (Signed)
[facility] for Warfarin Indication: h/o cardiac thrombus  Allergies  Allergen Reactions  . Other Shortness Of Breath    Beta blocker     Patient Measurements: Height: 5\' 4"  (162.6 cm) Weight: 185 lb (83.9 kg) IBW/kg (Calculated) : 54.7  Vital Signs: Temp: 98 F (36.7 C) (03/03 0445) Temp Source: Oral (03/03 0445) BP: 124/83 (03/03 0445) Pulse Rate: 97 (03/03 0445)  Labs: Recent Labs    09/19/17 0959 09/19/17 1316 09/20/17 0339 09/21/17 0435  HGB 12.4  --  11.9*  --   HCT 38.4  --  37.1  --   PLT 379  --  344  --   LABPROT  --  55.5* 71.0* 37.6*  INR  --  6.35* 8.70* 3.86  CREATININE 1.11*  --  0.69  --   TROPONINI 0.04*  --   --   --     Estimated Creatinine Clearance: 81.3 mL/min (by C-G formula based on SCr of 0.69 mg/dL).   Medical History: Past Medical History:  Diagnosis Date  . CHF (congestive heart failure) ([facility])   . COPD (chronic obstructive pulmonary disease) ([facility])   . Diabetes mellitus without complication ([place])   . Hypercholesteremia   . Hypertension   . Reflux esophagitis    Assessment: 57 y/o F with a h/o cardiac thrombus on warfarin admitted with AECOPD. Patient taking warfarin 7.5 mg daily except 10 mg Mondays PTA.                           INR      Warfarin 3/1     6.35         --- 3/2     8.70         --- 3/3     3.86         ---  Goal of Therapy:  INR 2-3   Assesment/Plan:  INR is elevated. Will hold warfarin and follow daily INR/CBC for now.   Zara Wendt K, [place] 09/21/2017,7:35 AM

## 2017-09-21 NOTE — Progress Notes (Signed)
[name]    MR#:  [id number]  DATE OF BIRTH:  1960-10-25  SUBJECTIVE:  CHIEF COMPLAINT:   Chief Complaint  Patient presents with  . Chest Pain     Came with complaint of chest pain, cough, shortness of breath and requiring supplemental oxygen. Feels little bit better today. Still on oxygen. Had spells of excessive coughing and hypoxia still a few times this morning.  REVIEW OF SYSTEMS:  CONSTITUTIONAL: No fever, fatigue or weakness.  EYES: No blurred or double vision.  EARS, NOSE, AND THROAT: No tinnitus or ear pain.  RESPIRATORY: Positive for cough, shortness of breath, wheezing , no hemoptysis.  CARDIOVASCULAR: No chest pain, orthopnea, edema.  GASTROINTESTINAL: No nausea, vomiting, diarrhea or abdominal pain.  GENITOURINARY: No dysuria, hematuria.  ENDOCRINE: No polyuria, nocturia,  HEMATOLOGY: No anemia, easy bruising or bleeding SKIN: No rash or lesion. MUSCULOSKELETAL: No joint pain or arthritis.   NEUROLOGIC: No tingling, numbness, weakness.  PSYCHIATRY: No anxiety or depression.   ROS  DRUG ALLERGIES:   Allergies  Allergen Reactions  . Other Shortness Of Breath    Beta blocker     VITALS:  Blood pressure 131/83, pulse (!) 102, temperature 97.7 F (36.5 C), temperature source Oral, resp. rate 16, height 5\' 4"  (1.626 m), weight 83.9 kg (185 lb), SpO2 98 %.  PHYSICAL EXAMINATION:  GENERAL:  57 y.o.-year-old patient lying in the bed with no acute distress.  EYES: Pupils equal, round, reactive to light and accommodation. No scleral icterus. Extraocular muscles intact.  HEENT: Head atraumatic, normocephalic. Oropharynx and nasopharynx clear.  NECK:  Supple, no jugular venous distention. No thyroid enlargement, no tenderness.  LUNGS: Normal breath sounds bilaterally, some wheezing, no crepitation. No use of accessory muscles of respiration. On supplemental oxygen via nasal  cannula. CARDIOVASCULAR: S1, S2 normal. No murmurs, rubs, or gallops.  ABDOMEN: Soft, nontender, nondistended. Bowel sounds present. No organomegaly or mass.  EXTREMITIES: No pedal edema, cyanosis, or clubbing.  NEUROLOGIC: Cranial nerves II through XII are intact. Muscle strength 5/5 in all extremities. Sensation intact. Gait not checked.  PSYCHIATRIC: The patient is alert and oriented x 3.  SKIN: No obvious rash, lesion, or ulcer.   Physical Exam LABORATORY PANEL:   CBC Recent Labs  Lab 09/20/17 0339  WBC 14.4*  HGB 11.9*  HCT 37.1  PLT 344   ------------------------------------------------------------------------------------------------------------------  Chemistries  Recent Labs  Lab 09/20/17 0339  NA 135  K 5.5*  CL 101  CO2 25  GLUCOSE 197*  BUN 20  CREATININE 0.69  CALCIUM 9.4   ------------------------------------------------------------------------------------------------------------------  Cardiac Enzymes Recent Labs  Lab 09/19/17 0959  TROPONINI 0.04*   ------------------------------------------------------------------------------------------------------------------  RADIOLOGY:  No results found.  ASSESSMENT AND PLAN:   Active Problems:   PNA (pneumonia)  Patient is a 57 year old with history of COPD presenting with shortness of breath  1.  Acute respiratory failure due to pneumonia and COPD exasperation  treat pneumonia with ceftriaxone and azithromycin Obtain sputum cultures   Influenza is negative, continue current therapy.  2.  Acute on chronic COPD exasperation    nebulizer therapy IV Solu-Medrol Place her on Pulmicort instead of Advair on mucolytic's  3.  Diabetes type 2 Continue Metformin Pl. sliding scale insulin  4 history of CHF type unknown.  Currently compensated  5.  Hyperlipidemia continue Lipitor  6.  Essential hypertension continue lisinopril  7. H/o cardiac thrombus- continue coumadin- INR was supratherapeutic  on admission,  pharmacy to manage the dosing.  8.  Nicotine abuse smoking cessation provided 4 min spent, recommended she stop smoking, nicotine patch   All the records are reviewed and case discussed with Care Management/Social Workerr. Management plans discussed with the patient, family and they are in agreement.  CODE STATUS: full.  TOTAL TIME TAKING CARE OF THIS PATIENT: 35 minutes.    POSSIBLE D/C IN 1-2 DAYS, DEPENDING ON CLINICAL CONDITION.   [name] M.D on 09/21/2017   Between 7am to 6pm - Pager - [phone]  After 6pm go to [email or web address] - password [email or web address] [facility]  Office  [phone]  CC: Primary care physician; Patient, No Pcp Per  Note: This dictation was prepared with Dragon dictation along with smaller phrase technology. Any transcriptional errors that result from this process are unintentional.

## 2017-09-21 NOTE — Progress Notes (Signed)
[facility] [place] NAME: Laura Booth    MR#:  [id number]  DATE OF BIRTH:  09/02/1960  SUBJECTIVE:  CHIEF COMPLAINT:   Chief Complaint  Patient presents with  . Chest Pain     Came with complaint of chest pain, cough, shortness of breath and requiring supplemental oxygen. Feels little bit better today. Still on oxygen.  REVIEW OF SYSTEMS:  CONSTITUTIONAL: No fever, fatigue or weakness.  EYES: No blurred or double vision.  EARS, NOSE, AND THROAT: No tinnitus or ear pain.  RESPIRATORY: Positive for cough, shortness of breath, wheezing , no hemoptysis.  CARDIOVASCULAR: No chest pain, orthopnea, edema.  GASTROINTESTINAL: No nausea, vomiting, diarrhea or abdominal pain.  GENITOURINARY: No dysuria, hematuria.  ENDOCRINE: No polyuria, nocturia,  HEMATOLOGY: No anemia, easy bruising or bleeding SKIN: No rash or lesion. MUSCULOSKELETAL: No joint pain or arthritis.   NEUROLOGIC: No tingling, numbness, weakness.  PSYCHIATRY: No anxiety or depression.   ROS  DRUG ALLERGIES:   Allergies  Allergen Reactions  . Other Shortness Of Breath    Beta blocker     VITALS:  Blood pressure 131/83, pulse (!) 102, temperature 97.7 F (36.5 C), temperature source Oral, resp. rate 16, height 5\' 4"  (1.626 m), weight 83.9 kg (185 lb), SpO2 98 %.  PHYSICAL EXAMINATION:  GENERAL:  57 y.o.-year-old patient lying in the bed with no acute distress.  EYES: Pupils equal, round, reactive to light and accommodation. No scleral icterus. Extraocular muscles intact.  HEENT: Head atraumatic, normocephalic. Oropharynx and nasopharynx clear.  NECK:  Supple, no jugular venous distention. No thyroid enlargement, no tenderness.  LUNGS: Normal breath sounds bilaterally, some wheezing, no crepitation. No use of accessory muscles of respiration. On supplemental oxygen via nasal cannula. CARDIOVASCULAR: S1, S2 normal. No murmurs, rubs, or gallops.  ABDOMEN: Soft, nontender,  nondistended. Bowel sounds present. No organomegaly or mass.  EXTREMITIES: No pedal edema, cyanosis, or clubbing.  NEUROLOGIC: Cranial nerves II through XII are intact. Muscle strength 5/5 in all extremities. Sensation intact. Gait not checked.  PSYCHIATRIC: The patient is alert and oriented x 3.  SKIN: No obvious rash, lesion, or ulcer.   Physical Exam LABORATORY PANEL:   CBC Recent Labs  Lab 09/20/17 0339  WBC 14.4*  HGB 11.9*  HCT 37.1  PLT 344   ------------------------------------------------------------------------------------------------------------------  Chemistries  Recent Labs  Lab 09/20/17 0339  NA 135  K 5.5*  CL 101  CO2 25  GLUCOSE 197*  BUN 20  CREATININE 0.69  CALCIUM 9.4   ------------------------------------------------------------------------------------------------------------------  Cardiac Enzymes Recent Labs  Lab 09/19/17 0959  TROPONINI 0.04*   ------------------------------------------------------------------------------------------------------------------  RADIOLOGY:  No results found.  ASSESSMENT AND PLAN:   Active Problems:   PNA (pneumonia)  Patient is a 57 year old with history of COPD presenting with shortness of breath  1.  Acute respiratory failure due to pneumonia and COPD exasperation  treat pneumonia with ceftriaxone and azithromycin Obtain sputum cultures  2.  Acute on chronic COPD exasperation    nebulizer therapy IV Solu-Medrol Place her on Pulmicort instead of Advair on mucolytic's  3.  Diabetes type 2 Continue Metformin Pl. sliding scale insulin  4 history of CHF type unknown.  Currently compensated  5.  Hyperlipidemia continue Lipitor  6.  Essential hypertension continue lisinopril  7. H/o cardiac thrombus- continue coumadin  8.  Nicotine abuse smoking cessation provided 4 min spent, recommended she stop smoking, nicotine patch   All the records are reviewed and case discussed  with Care  Management/Social Workerr. Management plans discussed with the patient, family and they are in agreement.  CODE STATUS: full.  TOTAL TIME TAKING CARE OF THIS PATIENT: 35 minutes.    POSSIBLE D/C IN 1-2 DAYS, DEPENDING ON CLINICAL CONDITION.   [name] M.D on 09/21/2017   Between 7am to 6pm - Pager - [phone]  After 6pm go to [email or web address] - password [email or web address] [facility]  Office  [phone]  CC: Primary care physician; Patient, No Pcp Per  Note: This dictation was prepared with Dragon dictation along with smaller phrase technology. Any transcriptional errors that result from this process are unintentional.

## 2017-09-22 ENCOUNTER — Inpatient Hospital Stay: Payer: Commercial Managed Care - PPO

## 2017-09-22 LAB — CBC
HCT: 38.2 % (ref 35.0–47.0)
Hemoglobin: 12.1 g/dL (ref 12.0–16.0)
MCH: 27.9 pg (ref 26.0–34.0)
MCHC: 31.7 g/dL — AB (ref 32.0–36.0)
MCV: 87.8 fL (ref 80.0–100.0)
PLATELETS: 423 10*3/uL (ref 150–440)
RBC: 4.35 MIL/uL (ref 3.80–5.20)
RDW: 14.4 % (ref 11.5–14.5)
WBC: 20.2 10*3/uL — ABNORMAL HIGH (ref 3.6–11.0)

## 2017-09-22 LAB — BASIC METABOLIC PANEL
Anion gap: 12 (ref 5–15)
BUN: 25 mg/dL — AB (ref 6–20)
CHLORIDE: 97 mmol/L — AB (ref 101–111)
CO2: 28 mmol/L (ref 22–32)
CREATININE: 0.86 mg/dL (ref 0.44–1.00)
Calcium: 9.8 mg/dL (ref 8.9–10.3)
GFR calc Af Amer: >60 mL/min (ref >60)
Glucose, Bld: 253 mg/dL — ABNORMAL HIGH (ref 65–99)
POTASSIUM: 5 mmol/L (ref 3.5–5.1)
Sodium: 137 mmol/L (ref 135–145)

## 2017-09-22 LAB — GLUCOSE, CAPILLARY
Glucose-Capillary: 129 mg/dL — ABNORMAL HIGH (ref 65–99)
Glucose-Capillary: 208 mg/dL — ABNORMAL HIGH (ref 65–99)

## 2017-09-22 LAB — PROTIME-INR
INR: 2.58
PROTHROMBIN TIME: 27.5 s — AB (ref 11.4–15.2)

## 2017-09-22 MED ORDER — WARFARIN SODIUM 6 MG PO TABS: 6.0000 mg | ORAL_TABLET | Freq: Every day | ORAL | 0 refills | Status: AC

## 2017-09-22 MED ORDER — IPRATROPIUM-ALBUTEROL 0.5-2.5 (3) MG/3ML IN SOLN
3.0000 mL | RESPIRATORY_TRACT | 0 refills | Status: AC
Start: 2017-09-22 — End: ?

## 2017-09-22 MED ORDER — NICOTINE 21 MG/24HR TD PT24: 21.0000 mg | MEDICATED_PATCH | Freq: Every day | TRANSDERMAL | 0 refills | Status: DC

## 2017-09-22 MED ORDER — AZITHROMYCIN 250 MG PO TABS: 250.0000 mg | ORAL_TABLET | Freq: Every day | ORAL | 0 refills | Status: AC

## 2017-09-22 MED ORDER — GUAIFENESIN-DM 100-10 MG/5ML PO SYRP: 15.0000 mL | ORAL_SOLUTION | ORAL | 0 refills | Status: DC | PRN

## 2017-09-22 MED ORDER — PREDNISONE 10 MG (21) PO TBPK: ORAL_TABLET | ORAL | 0 refills | Status: DC

## 2017-09-22 MED ORDER — ALBUTEROL SULFATE HFA 108 (90 BASE) MCG/ACT IN AERS
2.0000 | INHALATION_SPRAY | RESPIRATORY_TRACT | Status: DC | PRN
  Filled 2017-09-22: qty 6.7

## 2017-09-22 MED ORDER — CEPHALEXIN 250 MG PO CAPS: 250.0000 mg | ORAL_CAPSULE | Freq: Two times a day (BID) | ORAL | 0 refills | Status: AC

## 2017-09-22 MED ORDER — PROAIR HFA 108 (90 BASE) MCG/ACT IN AERS: 2.0000 | INHALATION_SPRAY | Freq: Four times a day (QID) | RESPIRATORY_TRACT | 1 refills | Status: AC | PRN

## 2017-09-22 MED ORDER — METHYLPREDNISOLONE SODIUM SUCC 125 MG IJ SOLR: 60.0000 mg | INTRAMUSCULAR | Status: DC

## 2017-09-22 NOTE — Progress Notes (Signed)
  BS considerably improved. Still some barely audible wheezing.

## 2017-09-22 NOTE — Progress Notes (Signed)
Pt discharged at this time.  Inhaler provided.  instrustions discussed with pt  Meds/ diet activity and f/u verbalizes understanding.  No c/o/

## 2017-09-22 NOTE — Discharge Summary (Signed)
[facility] [place] NAME: Laura Booth    MR#:  [id number]  DATE OF BIRTH:  1961/07/20  DATE OF ADMISSION:  09/19/2017 ADMITTING PHYSICIAN: Dustin Flock, MD  DATE OF DISCHARGE: 09/22/2017   PRIMARY CARE PHYSICIAN: Patient, No Pcp Per    ADMISSION DIAGNOSIS:  SOB (shortness of breath) [R06.02] Hypoxia [R09.02] Sepsis, due to unspecified organism ([place]) [A41.9]  DISCHARGE DIAGNOSIS:  Active Problems:   PNA (pneumonia)   SECONDARY DIAGNOSIS:   Past Medical History:  Diagnosis Date  . CHF (congestive heart failure) ([place])   . COPD (chronic obstructive pulmonary disease) ([place])   . Diabetes mellitus without complication ([place])   . Hypercholesteremia   . Hypertension   . Reflux esophagitis     HOSPITAL COURSE:    Patient is a 57 year old with history of COPD presenting with shortness of breath  1.Acute respiratory failure due to pneumonia and COPD exasperation  treat pneumonia with ceftriaxone and azithromycin Obtain sputum cultures   Influenza is negative, continue current therapy.   Much improved now.  2.Acute on chronic COPD exasperation    nebulizer therapy IV Solu-Medrol Place her on Pulmicort instead of Advair on mucolytics.  3.Diabetes type 2 Continue Metformin Pl. sliding scale insulin  4history of CHF type unknown.Currently compensated  5.Hyperlipidemia continue Lipitor  6.Essential hypertension continue lisinopril  7. H/o cardiac thrombus- continue coumadin- INR was supratherapeutic on admission, pharmacy to manage the dosing.  decreasing to 6 mg daily on d/c ., advised to follow INR with PMD in 3-4 days.  8.Nicotine abuse smoking cessation provided 4 minspent,recommended she stop smoking,nicotine patch    DISCHARGE CONDITIONS:   Stable.  CONSULTS OBTAINED:    DRUG ALLERGIES:   Allergies  Allergen Reactions  . Other Shortness Of Breath    Beta blocker      DISCHARGE MEDICATIONS:   Allergies as of 09/22/2017      Reactions   Other Shortness Of Breath   Beta blocker       Medication List    STOP taking these medications   lisinopril 40 MG tablet Commonly known as:  PRINIVIL,ZESTRIL     TAKE these medications   ADVAIR DISKUS 500-50 MCG/DOSE Aepb Generic drug:  Fluticasone-Salmeterol Inhale 1 puff into the lungs every 12 (twelve) hours.   atorvastatin 40 MG tablet Commonly known as:  LIPITOR Take 40 mg by mouth daily.   azithromycin 250 MG tablet Commonly known as:  ZITHROMAX Z-PAK Take 1 tablet (250 mg total) by mouth daily for 3 days.   cephALEXin 250 MG capsule Commonly known as:  KEFLEX Take 1 capsule (250 mg total) by mouth 2 (two) times daily for 4 days.   fexofenadine 180 MG tablet Commonly known as:  ALLEGRA Take 180 mg by mouth daily.   guaiFENesin-dextromethorphan 100-10 MG/5ML syrup Commonly known as:  ROBITUSSIN DM Take 15 mLs by mouth every 4 (four) hours as needed for cough.   ipratropium-albuterol 0.5-2.5 (3) MG/3ML Soln Commonly known as:  DUONEB Take 3 mLs by nebulization every 4 (four) hours.   magnesium oxide 400 MG tablet Commonly known as:  MAG-OX Take 1 tablet by mouth 2 (two) times daily.   metFORMIN 750 MG 24 hr tablet Commonly known as:  GLUCOPHAGE-XR Take 1 tablet by mouth 2 (two) times daily.   montelukast 10 MG tablet Commonly known as:  SINGULAIR Take 10 mg by mouth daily.   nicotine 21 mg/24hr patch Commonly known as:  NICODERM CQ -  dosed in mg/24 hours Place 1 patch (21 mg total) onto the skin daily. Start taking on:  09/23/2017   omeprazole 20 MG capsule Commonly known as:  PRILOSEC Take 20 mg by mouth daily.   predniSONE 10 MG (21) Tbpk tablet Commonly known as:  STERAPRED UNI-PAK 21 TAB Take 6 tabs first day, 5 tab on day 2, then 4 on day 3rd, 3 tabs on day 4th , 2 tab on day 5th, and 1 tab on 6th day.   PROAIR HFA 108 (90 Base) MCG/ACT inhaler Generic drug:   albuterol Inhale 2 puffs into the lungs 4 (four) times daily as needed.   SPIRIVA HANDIHALER 18 MCG inhalation capsule Generic drug:  tiotropium Place 1 puff into inhaler and inhale daily.   warfarin 6 MG tablet Commonly known as:  COUMADIN Take 1 tablet (6 mg total) by mouth daily at 6 PM. TAKE 10 MG BY MOUTH ON Monday AND 7.5MG  DAILY ALL OTHER DAYS What changed:    medication strength  how much to take  how to take this  when to take this        DISCHARGE INSTRUCTIONS:    Follow with PMD in 3-4 days and check INR.  If you experience worsening of your admission symptoms, develop shortness of breath, life threatening emergency, suicidal or homicidal thoughts you must seek medical attention immediately by calling 911 or calling your MD immediately  if symptoms less severe.  You Must read complete instructions/literature along with all the possible adverse reactions/side effects for all the Medicines you take and that have been prescribed to you. Take any new Medicines after you have completely understood and accept all the possible adverse reactions/side effects.   Please note  You were cared for by a hospitalist during your hospital stay. If you have any questions about your discharge medications or the care you received while you were in the hospital after you are discharged, you can call the unit and asked to speak with the hospitalist on call if the hospitalist that took care of you is not available. Once you are discharged, your primary care physician will handle any further medical issues. Please note that NO REFILLS for any discharge medications will be authorized once you are discharged, as it is imperative that you return to your primary care physician (or establish a relationship with a primary care physician if you do not have one) for your aftercare needs so that they can reassess your need for medications and monitor your lab values.    Today   CHIEF COMPLAINT:    Chief Complaint  Patient presents with  . Chest Pain    HISTORY OF PRESENT ILLNESS:  Laura Booth  is a 57 y.o. female with a known history of congestive heart failure, COPD, diabetes type 2, hypertension, hyper lipidemia and GERD who also states that she has a history of cardiac thrombus presenting with shortness of breath ongoing for the past 1 month.  Patient states that she was treated with some prednisone in February but her symptoms continue to get worse.  She has been having progressive shortness of breath wheezing and coughing.  Has not had any fevers or chills.  In the ER she is noted to have left upper lobe pneumonia and new oxygen requirements.   VITAL SIGNS:  Blood pressure 136/81, pulse (!) 113, temperature 98 F (36.7 C), temperature source Oral, resp. rate 20, height 5\' 4"  (1.626 m), weight 83.9 kg (185 lb), SpO2 91 %.  I/O:    Intake/Output Summary (Last 24 hours) at 09/22/2017 1518 Last data filed at 09/22/2017 1300 Gross per 24 hour  Intake 1070 ml  Output -  Net 1070 ml    PHYSICAL EXAMINATION:  GENERAL:  57 y.o.-year-old patient lying in the bed with no acute distress.  EYES: Pupils equal, round, reactive to light and accommodation. No scleral icterus. Extraocular muscles intact.  HEENT: Head atraumatic, normocephalic. Oropharynx and nasopharynx clear.  NECK:  Supple, no jugular venous distention. No thyroid enlargement, no tenderness.  LUNGS: Normal breath sounds bilaterally, no wheezing, rales,rhonchi or crepitation. No use of accessory muscles of respiration.  CARDIOVASCULAR: S1, S2 normal. No murmurs, rubs, or gallops.  ABDOMEN: Soft, non-tender, non-distended. Bowel sounds present. No organomegaly or mass.  EXTREMITIES: No pedal edema, cyanosis, or clubbing.  NEUROLOGIC: Cranial nerves II through XII are intact. Muscle strength 5/5 in all extremities. Sensation intact. Gait not checked.  PSYCHIATRIC: The patient is alert and oriented x 3.  SKIN: No  obvious rash, lesion, or ulcer.   DATA REVIEW:   CBC Recent Labs  Lab 09/22/17 0424  WBC 20.2*  HGB 12.1  HCT 38.2  PLT 423    Chemistries  Recent Labs  Lab 09/22/17 0424  NA 137  K 5.0  CL 97*  CO2 28  GLUCOSE 253*  BUN 25*  CREATININE 0.86  CALCIUM 9.8    Cardiac Enzymes Recent Labs  Lab 09/19/17 0959  TROPONINI 0.04*    Microbiology Results  Results for orders placed or performed during the hospital encounter of 09/19/17  Blood culture (routine x 2)     Status: None (Preliminary result)   Collection Time: 09/19/17 11:14 AM  Result Value Ref Range Status   Specimen Description BLOOD RIGHT ANTECUBITAL  Final   Special Requests   Final    BOTTLES DRAWN AEROBIC AND ANAEROBIC Blood Culture results may not be optimal due to an excessive volume of blood received in culture bottles   Culture   Final    NO GROWTH 3 DAYS Performed at [facility], [address] [place], [place] [address]    Report Status PENDING  Incomplete  Blood culture (routine x 2)     Status: None (Preliminary result)   Collection Time: 09/19/17 11:14 AM  Result Value Ref Range Status   Specimen Description BLOOD BLOOD RIGHT HAND  Final   Special Requests   Final    BOTTLES DRAWN AEROBIC AND ANAEROBIC Blood Culture results may not be optimal due to an excessive volume of blood received in culture bottles   Culture   Final    NO GROWTH 3 DAYS Performed at [facility], [address], [place], [place] [address]    Report Status PENDING  Incomplete    RADIOLOGY:  Dg Chest 2 View  Result Date: 09/22/2017 CLINICAL DATA:  Pneumonia EXAM: CHEST  2 VIEW COMPARISON:  09/19/2017 FINDINGS: Consolidation in the left upper lobe compatible with pneumonia, slightly improved since prior study. No confluent opacity on the right. Heart is normal size. No effusions. IMPRESSION: Left upper lobe patchy airspace disease compatible with pneumonia, slightly improved. Electronically Signed    By: Rolm Baptise M.D.   On: 09/22/2017 09:29    EKG:   Orders placed or performed during the hospital encounter of 09/19/17  . ED EKG within 10 minutes  . ED EKG within 10 minutes  . EKG 12-Lead  . EKG 12-Lead      Management plans discussed with  the patient, family and they are in agreement.  CODE STATUS:     Code Status Orders  (From admission, onward)        Start     Ordered   09/19/17 1442  Full code  Continuous     09/19/17 1441    Code Status History    Date Active Date Inactive Code Status Order ID Comments User Context   This patient has a current code status but no historical code status.      TOTAL TIME TAKING CARE OF THIS PATIENT: 35 minutes.    [name] M.D on 09/22/2017 at 3:18 PM  Between 7am to 6pm - Pager - [phone]  After 6pm go to [email or web address] - password [email or web address] [facility]  Office  [phone]  CC: Primary care physician; Patient, No Pcp Per   Note: This dictation was prepared with Dragon dictation along with smaller phrase technology. Any transcriptional errors that result from this process are unintentional.

## 2017-09-22 NOTE — Progress Notes (Signed)
Inpatient Diabetes Program Recommendations  AACE/ADA: New Consensus Statement on Inpatient Glycemic Control (2015)  Target Ranges:  Prepandial:   less than 140 mg/dL      Peak postprandial:   less than 180 mg/dL (1-2 hours)      Critically ill patients:  140 - 180 mg/dL  Results for Laura Booth, Laura Booth (MRN [id number]) as of 09/22/2017 08:16  Ref. Range 09/21/2017 07:45 09/21/2017 11:45 09/21/2017 16:57 09/21/2017 19:58 09/22/2017 07:56  Glucose-Capillary Latest Ref Range: 65 - 99 mg/dL 150 (H) 279 (H) 165 (H) 212 (H) 129 (H)    Review of Glycemic Control  Diabetes history: DM2 Outpatient Diabetes medications: Metformin XR 750 mg BID Current orders for Inpatient glycemic control: Novolog 0-9 units TID with meals, Metformin XR 750 mg BID; Solumedrol 60 mg Q6H  Inpatient Diabetes Program Recommendations:  Correction (SSI): Please consider adding Novolog 0-5 units QHS for bedtime corrrection. Insulin - Meal Coverage: If steroids will be continued, please consider ordering Novolog 3 units TID with meals for meal coverage if patient eats at least 50% of meals. HgbA1C: Please consider ordering an A1C to evaluate glycemic control over the past 2-3 months.  Thanks, Barnie Alderman, RN, MSN, CDE Diabetes Coordinator Inpatient Diabetes Program [phone] (Team Pager from 8am to 5pm)

## 2017-09-22 NOTE — Progress Notes (Signed)
[place], [place].   09/22/2017  Patient: Laura Booth   Date of Birth:  Jan 09, 1961  Date of admission:  09/19/2017  Date of Discharge  09/22/2017    To Whom it May Concern:   Parissa Chiao  may return to work on 09/27/17. Stay off until then to recover.  PHYSICAL ACTIVITY:  Full  If you have any questions or concerns, please don't hesitate to call.  Sincerely,   [name] M.D Pager [phone] Office : [phone]   .

## 2017-09-24 LAB — CULTURE, BLOOD (ROUTINE X 2)
CULTURE: NO GROWTH
Culture: NO GROWTH

## 2017-09-26 ENCOUNTER — Telehealth: Payer: Self-pay

## 2017-09-26 NOTE — Telephone Encounter (Signed)
EMMI Follow-up: Question on the report if patient read discharge papers.  No answer, so left a message for Laura Booth to call me back at her convenience.

## 2017-09-29 ENCOUNTER — Telehealth: Payer: Self-pay

## 2017-09-29 NOTE — Telephone Encounter (Signed)
Flagged on EMMI report for not reading discharge papers.  First attempt to reach made on 3/8 and second attempt made 3/11 at 11:48am.  Left voicemail encouraging patient to call back.

## 2017-11-28 ENCOUNTER — Ambulatory Visit
Admission: RE | Admit: 2017-11-28 | Payer: Commercial Managed Care - PPO | Source: Ambulatory Visit | Admitting: Unknown Physician Specialty

## 2017-11-28 ENCOUNTER — Encounter: Admission: RE | Payer: Self-pay | Source: Ambulatory Visit

## 2017-11-28 SURGERY — ESOPHAGOGASTRODUODENOSCOPY (EGD) WITH PROPOFOL
Anesthesia: General

## 2018-01-06 ENCOUNTER — Other Ambulatory Visit: Payer: Self-pay | Admitting: Family Medicine

## 2018-01-06 DIAGNOSIS — R1900 Intra-abdominal and pelvic swelling, mass and lump, unspecified site: Secondary | ICD-10-CM

## 2018-01-15 ENCOUNTER — Ambulatory Visit: Payer: Commercial Managed Care - PPO

## 2018-01-19 ENCOUNTER — Other Ambulatory Visit: Payer: Self-pay | Admitting: Family Medicine

## 2018-01-19 DIAGNOSIS — R918 Other nonspecific abnormal finding of lung field: Secondary | ICD-10-CM

## 2018-01-20 ENCOUNTER — Ambulatory Visit
Admission: RE | Admit: 2018-01-20 | Discharge: 2018-01-20 | Disposition: A | Discharge status: Home / Self Care | Payer: Commercial Managed Care - PPO | Source: Ambulatory Visit | Attending: Family Medicine | Admitting: Family Medicine

## 2018-01-20 DIAGNOSIS — D259 Leiomyoma of uterus, unspecified: Secondary | ICD-10-CM | POA: Insufficient documentation

## 2018-01-20 DIAGNOSIS — I700 Atherosclerosis of aorta: Secondary | ICD-10-CM | POA: Diagnosis not present

## 2018-01-20 DIAGNOSIS — R590 Localized enlarged lymph nodes: Secondary | ICD-10-CM | POA: Diagnosis not present

## 2018-01-20 DIAGNOSIS — R918 Other nonspecific abnormal finding of lung field: Secondary | ICD-10-CM | POA: Diagnosis not present

## 2018-01-20 DIAGNOSIS — J439 Emphysema, unspecified: Secondary | ICD-10-CM | POA: Insufficient documentation

## 2018-01-20 DIAGNOSIS — R1900 Intra-abdominal and pelvic swelling, mass and lump, unspecified site: Secondary | ICD-10-CM | POA: Insufficient documentation

## 2018-01-20 DIAGNOSIS — E041 Nontoxic single thyroid nodule: Secondary | ICD-10-CM | POA: Insufficient documentation

## 2018-01-20 DIAGNOSIS — K769 Liver disease, unspecified: Secondary | ICD-10-CM | POA: Diagnosis not present

## 2018-01-20 DIAGNOSIS — M899 Disorder of bone, unspecified: Secondary | ICD-10-CM | POA: Diagnosis not present

## 2018-01-20 DIAGNOSIS — I2510 Atherosclerotic heart disease of native coronary artery without angina pectoris: Secondary | ICD-10-CM | POA: Diagnosis not present

## 2018-01-20 HISTORY — DX: Unspecified asthma, uncomplicated: J45.909

## 2018-01-20 LAB — POCT I-STAT CREATININE: CREATININE: 0.5 mg/dL (ref 0.44–1.00)

## 2018-01-20 MED ORDER — IOHEXOL 300 MG/ML  SOLN
75.0000 mL | Freq: Once | INTRAMUSCULAR | Status: AC | PRN
  Administered 2018-01-20: 75 mL via INTRAVENOUS

## 2018-01-21 ENCOUNTER — Encounter: Payer: Self-pay | Admitting: *Deleted

## 2018-01-21 ENCOUNTER — Telehealth: Payer: Self-pay | Admitting: Oncology

## 2018-01-21 DIAGNOSIS — R918 Other nonspecific abnormal finding of lung field: Secondary | ICD-10-CM

## 2018-01-21 NOTE — Progress Notes (Addendum)
  Oncology Nurse Navigator Documentation  Navigator Location: [facility] (01/21/18 1700) Referral date to RadOnc/MedOnc: 01/21/18 (01/21/18 1700) )Navigator Encounter Type: Introductory phone call (01/21/18 1700)   Abnormal Finding Date: 01/20/18 (01/21/18 1700)                   Treatment Phase: Abnormal Scans (01/21/18 1700) Barriers/Navigation Needs: Coordination of Care (01/21/18 1700)   Interventions: Coordination of Care (01/21/18 1700)   Coordination of Care: Appts;Radiology (01/21/18 1700)        Acuity: Level 2 (01/21/18 1700)   Acuity Level 2: Assistance expediting appointments (01/21/18 1700)  STAT referral received from Dr. Lovie Macadamia for lung mass. Per Dr. Grayland Ormond, pt will need PET scan and consultation. Order for PET placed and authorization pending at this time. Phone call made to patient to coordinate appts. Instructed pt that will go ahead and schedule consult with Dr. Grayland Ormond while waiting for PET scan to be scheduled. Appt given for Monday 7/8 at 3pm but pt declined and requested morning appt later in the week. Pt scheduled in [facility] [place] on Friday 7/12 at 9am with Dr. Janese Banks and possibly pulmonologist depending on PET results. Informed pt that will try to schedule PET scan on Wednesday per pt request and she will be notified on Friday by Burgess Estelle, RN with appt details for PET. Contact info given and instructed to call with any further questions or needs. Pt verbalized understanding. Nothing further needed at this time.   Time Spent with Patient: 30 (01/21/18 1700)

## 2018-01-21 NOTE — Telephone Encounter (Signed)
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  She has a history of tobacco use and had chief complaint of shortness of breath and back pain.      Patient has been referred to the [facility] and will require a PET scan for further evaluation and to optimize management.  PET scan is also needed to assist in further diagnostic studies including possible biopsy.

## 2018-01-21 NOTE — Progress Notes (Signed)
Order for PET placed.

## 2018-01-26 ENCOUNTER — Ambulatory Visit: Payer: Commercial Managed Care - PPO | Admitting: Oncology

## 2018-01-26 NOTE — Addendum Note (Signed)
Addended by: Telford Nab on: 01/26/2018 08:32 AM   Modules accepted: Orders

## 2018-01-28 ENCOUNTER — Encounter
Admission: RE | Admit: 2018-01-28 | Discharge: 2018-01-28 | Disposition: A | Discharge status: Home / Self Care | Payer: Commercial Managed Care - PPO | Source: Ambulatory Visit | Attending: Oncology | Admitting: Oncology

## 2018-01-28 DIAGNOSIS — R918 Other nonspecific abnormal finding of lung field: Secondary | ICD-10-CM

## 2018-01-28 LAB — GLUCOSE, CAPILLARY: Glucose-Capillary: 126 mg/dL — ABNORMAL HIGH (ref 70–99)

## 2018-01-28 MED ORDER — FLUDEOXYGLUCOSE F - 18 (FDG) INJECTION
9.7900 | Freq: Once | INTRAVENOUS | Status: AC | PRN
  Administered 2018-01-28: 9.79 via INTRAVENOUS

## 2018-01-29 ENCOUNTER — Encounter: Payer: Self-pay | Admitting: *Deleted

## 2018-01-29 NOTE — Progress Notes (Signed)
  Oncology Nurse Navigator Documentation  Navigator Location: [facility] (01/29/18 1300)   )Navigator Encounter Type: Telephone (01/29/18 1300) Telephone: [place] Call (01/29/18 1300)                                       phone call made to patient to remind about appts scheduled tomorrow in the lung [facility]. Pt did not answer. Message left on patient's voicemail. Reviewed appts and arrival time. Instructed to call back if has any further questions.            Time Spent with Patient: 15 (01/29/18 1300)

## 2018-01-30 ENCOUNTER — Telehealth: Payer: Self-pay | Admitting: *Deleted

## 2018-01-30 ENCOUNTER — Inpatient Hospital Stay: Payer: Commercial Managed Care - PPO | Attending: Oncology | Admitting: Oncology

## 2018-01-30 ENCOUNTER — Ambulatory Visit (INDEPENDENT_AMBULATORY_CARE_PROVIDER_SITE_OTHER): Payer: Commercial Managed Care - PPO | Admitting: Internal Medicine

## 2018-01-30 ENCOUNTER — Encounter: Payer: Self-pay | Admitting: *Deleted

## 2018-01-30 ENCOUNTER — Encounter: Payer: Self-pay | Admitting: Oncology

## 2018-01-30 ENCOUNTER — Other Ambulatory Visit: Payer: Self-pay | Admitting: *Deleted

## 2018-01-30 VITALS — BP 118/81 | HR 102 | Temp 97.4°F | Resp 18 | Ht 64.0 in | Wt 170.0 lb

## 2018-01-30 VITALS — BP 118/81 | HR 102 | Temp 97.4°F | Resp 18 | Ht 64.0 in | Wt 170.2 lb

## 2018-01-30 DIAGNOSIS — R918 Other nonspecific abnormal finding of lung field: Secondary | ICD-10-CM

## 2018-01-30 DIAGNOSIS — J449 Chronic obstructive pulmonary disease, unspecified: Secondary | ICD-10-CM | POA: Diagnosis not present

## 2018-01-30 DIAGNOSIS — C7931 Secondary malignant neoplasm of brain: Secondary | ICD-10-CM | POA: Diagnosis not present

## 2018-01-30 DIAGNOSIS — C7951 Secondary malignant neoplasm of bone: Secondary | ICD-10-CM | POA: Diagnosis not present

## 2018-01-30 DIAGNOSIS — Z7189 Other specified counseling: Secondary | ICD-10-CM

## 2018-01-30 DIAGNOSIS — Z86718 Personal history of other venous thrombosis and embolism: Secondary | ICD-10-CM | POA: Diagnosis not present

## 2018-01-30 DIAGNOSIS — G893 Neoplasm related pain (acute) (chronic): Secondary | ICD-10-CM | POA: Diagnosis not present

## 2018-01-30 DIAGNOSIS — Z79899 Other long term (current) drug therapy: Secondary | ICD-10-CM | POA: Diagnosis not present

## 2018-01-30 DIAGNOSIS — Z7901 Long term (current) use of anticoagulants: Secondary | ICD-10-CM

## 2018-01-30 DIAGNOSIS — I429 Cardiomyopathy, unspecified: Secondary | ICD-10-CM | POA: Diagnosis not present

## 2018-01-30 DIAGNOSIS — C787 Secondary malignant neoplasm of liver and intrahepatic bile duct: Secondary | ICD-10-CM | POA: Insufficient documentation

## 2018-01-30 DIAGNOSIS — E119 Type 2 diabetes mellitus without complications: Secondary | ICD-10-CM | POA: Diagnosis not present

## 2018-01-30 DIAGNOSIS — I509 Heart failure, unspecified: Secondary | ICD-10-CM | POA: Insufficient documentation

## 2018-01-30 DIAGNOSIS — E7800 Pure hypercholesterolemia, unspecified: Secondary | ICD-10-CM | POA: Insufficient documentation

## 2018-01-30 DIAGNOSIS — Z7984 Long term (current) use of oral hypoglycemic drugs: Secondary | ICD-10-CM | POA: Diagnosis not present

## 2018-01-30 DIAGNOSIS — I110 Hypertensive heart disease with heart failure: Secondary | ICD-10-CM

## 2018-01-30 DIAGNOSIS — Z87891 Personal history of nicotine dependence: Secondary | ICD-10-CM | POA: Diagnosis not present

## 2018-01-30 DIAGNOSIS — C3412 Malignant neoplasm of upper lobe, left bronchus or lung: Secondary | ICD-10-CM | POA: Insufficient documentation

## 2018-01-30 DIAGNOSIS — K210 Gastro-esophageal reflux disease with esophagitis: Secondary | ICD-10-CM | POA: Diagnosis not present

## 2018-01-30 MED ORDER — OXYCODONE HCL 5 MG PO TABS: 5.0000 mg | ORAL_TABLET | Freq: Four times a day (QID) | ORAL | 0 refills | Status: DC | PRN

## 2018-01-30 NOTE — Progress Notes (Signed)
  Oncology Nurse Navigator Documentation  Navigator Location: [facility] (01/30/18 1400)   )Navigator Encounter Type: Initial MedOnc (01/30/18 1400)               Multidisiplinary Clinic Date: 01/30/18 (01/30/18 1400) Multidisiplinary Clinic Type: Thoracic (01/30/18 1400)     Treatment Phase: Abnormal Scans (01/30/18 1400) Barriers/Navigation Needs: Coordination of Care (01/30/18 1400)   Interventions: Coordination of Care (01/30/18 1400)   Coordination of Care: Appts (01/30/18 1400)         met with patient during initial consultation in the thoracic multidisciplinary clinic. All questions answered at the time of visit. Assisted pt in getting seen by cardiology to obtain cardiac clearance to proceed with biopsy. Reviewed upcoming appts with patient. Contact info given and instructed to call with any further questions. Informed that we will be in touch with her once cardiology clearance obtained to proceed with scheduling biopsy. Pt verbalized understanding. Nothing further needed at this time.         Time Spent with Patient: 60 (01/30/18 1400)

## 2018-01-30 NOTE — Progress Notes (Signed)
[facility] Consultation      Assessment and Plan:  Left upper lobe lung mass with mediastinal lymphadenopathy. - Discussed with patient, likelihood of cancer. - Discussed diagnostic options including bronchoscopy with EBUS versus needle biopsy of liver lesions.  Given her cardiomyopathy and history of LV thrombus the needle biopsy would likely be the safer option. - We will obtain cardiac clearance, and hopefully an updated echocardiogram to look at a cardiac function now and to see if the patient still has a cardiac thrombus. - We will discuss with radiology to see if the liver lesions are amenable to needle biopsy.  Cardiomyopathy with left ventricular thrombus. - Patient does not appear to be symptomatic at this time, history of ejection fraction of 30%, with LV thrombus. - Refer to cardiology for cardiac clearance and updated echocardiogram.   Date: 01/30/2018  MRN# [id number] Laura Booth 10-Nov-1960  Referring Physician: Dr. Janese Banks.   Laura Booth is a 57 y.o. old female seen in consultation for chief complaint of: lung mass.     HPI:   The patient is a 57 year old female she has a history of cardiomyopathy with EF of 30% on echocardiogram seen approximately 2 years ago.  He was also noted that she had an apical LV thrombus,  she was subsequently started on Coumadin, which she has been on since that time, she also has a history of TIA.Marland Kitchen She was seen in her primary care physician's office approximately 1 month ago with right arm pain, thought to be radiculopathy, as well as mid back pain.  She will underwent a chest x-ray which showed a questionable lung mass, which further led to a CT scan.  She was admitted to the hospital from 09/19/2017 to 09/22/2017 with pneumonia.   PMHX:   Past Medical History:  Diagnosis Date  . Asthma   . CHF (congestive heart failure) ([place])   . COPD (chronic obstructive pulmonary disease) ([place])   . Diabetes mellitus without  complication ([place])   . Hypercholesteremia   . Hypertension   . Reflux esophagitis    Surgical Hx:  No past surgical history on file. Family Hx:  No family history on file. Social Hx:   Social History   Tobacco Use  . Smoking status: Current Every Day Smoker  . Smokeless tobacco: Never Used  Substance Use Topics  . Alcohol use: No    Frequency: Never  . Drug use: No   Medication:    Current Outpatient Medications:  .  ADVAIR DISKUS 500-50 MCG/DOSE AEPB, Inhale 1 puff into the lungs every 12 (twelve) hours., Disp: , Rfl: 0 .  atorvastatin (LIPITOR) 40 MG tablet, Take 40 mg by mouth daily. , Disp: , Rfl:  .  fexofenadine (ALLEGRA) 180 MG tablet, Take 180 mg by mouth daily., Disp: , Rfl:  .  guaiFENesin-dextromethorphan (ROBITUSSIN DM) 100-10 MG/5ML syrup, Take 15 mLs by mouth every 4 (four) hours as needed for cough. (Patient not taking: Reported on 01/30/2018), Disp: 118 mL, Rfl: 0 .  ipratropium-albuterol (DUONEB) 0.5-2.5 (3) MG/3ML SOLN, Take 3 mLs by nebulization every 4 (four) hours., Disp: 360 mL, Rfl: 0 .  magnesium oxide (MAG-OX) 400 MG tablet, Take 1 tablet by mouth 2 (two) times daily., Disp: , Rfl: 2 .  metFORMIN (GLUCOPHAGE-XR) 750 MG 24 hr tablet, Take 1 tablet by mouth 2 (two) times daily., Disp: , Rfl: 3 .  montelukast (SINGULAIR) 10 MG tablet, Take 10 mg by mouth daily., Disp: , Rfl: 3 .  nicotine (NICODERM CQ - DOSED IN MG/24 HOURS) 21 mg/24hr patch, Place 1 patch (21 mg total) onto the skin daily. (Patient not taking: Reported on 01/30/2018), Disp: 28 patch, Rfl: 0 .  omeprazole (PRILOSEC) 20 MG capsule, Take 20 mg by mouth daily., Disp: , Rfl:  .  predniSONE (STERAPRED UNI-PAK 21 TAB) 10 MG (21) TBPK tablet, Take 6 tabs first day, 5 tab on day 2, then 4 on day 3rd, 3 tabs on day 4th , 2 tab on day 5th, and 1 tab on 6th day., Disp: 21 tablet, Rfl: 0 .  PROAIR HFA 108 (90 Base) MCG/ACT inhaler, Inhale 2 puffs into the lungs 4 (four) times daily as needed. (Patient not  taking: Reported on 01/30/2018), Disp: 18 g, Rfl: 1 .  SPIRIVA HANDIHALER 18 MCG inhalation capsule, Place 1 puff into inhaler and inhale daily., Disp: , Rfl: 0 .  warfarin (COUMADIN) 6 MG tablet, Take 1 tablet (6 mg total) by mouth daily at 6 PM. TAKE 10 MG BY MOUTH ON Monday AND 7.5MG  DAILY ALL OTHER DAYS, Disp: 30 tablet, Rfl: 0   Allergies:  Other  Review of Systems: Gen:  Denies  fever, sweats, chills HEENT: Denies blurred vision, double vision. bleeds, sore throat Cvc:  No dizziness, chest pain. Resp:   Denies cough or sputum production, shortness of breath Gi: Denies swallowing difficulty, stomach pain. Gu:  Denies bladder incontinence, burning urine Ext:   No Joint pain, stiffness. Skin: No skin rash,  hives  Endoc:  No polyuria, polydipsia. Psych: No depression, insomnia. Other:  All other systems were reviewed with the patient and were negative other that what is mentioned in the HPI.   Physical Examination:   VS: reviewed.   General Appearance: No distress  Neuro:without focal findings,  speech normal,  HEENT: PERRLA, EOM intact.   Pulmonary: normal breath sounds, No wheezing.  CardiovascularNormal S1,S2.  No m/r/g.   Abdomen: Benign, Soft, non-tender. Renal:  No costovertebral tenderness  GU:  No performed at this time. Endoc: No evident thyromegaly, no signs of acromegaly. Skin:   warm, no rashes, no ecchymosis  Extremities: normal, no cyanosis, clubbing.  Other findings:    LABORATORY PANEL:   CBC No results for input(s): WBC, HGB, HCT, PLT in the last 168 hours. ------------------------------------------------------------------------------------------------------------------  Chemistries  No results for input(s): NA, K, CL, CO2, GLUCOSE, BUN, CREATININE, CALCIUM, MG, AST, ALT, ALKPHOS, BILITOT in the last 168 hours.  Invalid input(s):  GFRCGP ------------------------------------------------------------------------------------------------------------------  Cardiac Enzymes No results for input(s): TROPONINI in the last 168 hours. ------------------------------------------------------------  RADIOLOGY:  Nm Pet Image Initial (pi) Skull Base To Thigh  Result Date: 01/28/2018 CLINICAL DATA:  Initial treatment strategy for CT demonstrating findings suspicious for left upper lobe primary bronchogenic carcinoma. EXAM: NUCLEAR MEDICINE PET SKULL BASE TO THIGH TECHNIQUE: 9.8 mCi F-18 FDG was injected intravenously. Full-ring PET imaging was performed from the skull base to thigh after the radiotracer. CT data was obtained and used for attenuation correction and anatomic localization. Fasting blood glucose: 126 mg/dl COMPARISON:  Chest CT 01/20/2018. Chest radiographs including 09/22/2017. FINDINGS: Mediastinal blood pool activity: SUV max 1.7 NECK: No areas of abnormal hypermetabolism. Incidental CT findings: No cervical adenopathy. Bilateral carotid atherosclerosis. CHEST: Hypermetabolism corresponding to the previously described central left upper lobe lung mass. This measures on the order of 8.9 cm and a S.U.V. max of 16.9 on 79/3. Adjacent prevascular adenopathy measures a S.U.V. max of 6.2 on 75/3. More equivocal precarinal node of 9 mm and a  S.U.V. max of 2.9 on 79/3. Subcarinal node of 12 mm and a S.U.V. max of 3.8. Incidental CT findings: Moderate centrilobular emphysema. Right middle lobe 10 mm pulmonary nodule is not significantly hypermetabolic, but at the low end of PET resolution. Example image 91/3. Left main coronary artery atherosclerosis. ABDOMEN/PELVIS: Multiple hypermetabolic hepatic foci, suspicious for metastatic disease. Examples at a S.U.V. max of 5.1 in the right hepatic lobe on image 136/3 and at a S.U.V. max of 6.6 more inferiorly in the right hepatic lobe including on image 156/3. These are relatively CT occult on this  nondedicated nonenhanced motion degraded CT. Incidental CT findings: 3.1 cm left renal cyst. Abdominal aortic atherosclerosis. Grossly normal adrenal glands. Suspect soft tissue fullness about the distal appendix, including on image 189/3. No surrounding inflammation. Fibroid uterus. Probable nabothian cysts. Mild pelvic floor laxity. SKELETON: Multifocal osseous metastasis as evidenced by hypermetabolism. Example within the posterior left femoral neck at a S.U.V. max of 5.4. A sclerotic right anterior acetabular lesion measures a S.U.V. max of 9.5 on 217/3. Incidental CT findings: Bilateral femoral head avascular necrosis. IMPRESSION: 1. 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 2. Aortic atherosclerosis (ICD10-I70.0), coronary artery atherosclerosis and emphysema (ICD10-J43.9). 3. Suspicion of soft tissue fullness about the distal appendix. Correlate with any symptoms to suggest acute appendicitis (felt unlikely). This appearance can be seen with appendiceal mucocele. This could be re-evaluated on follow-up imaging or if more complete characterization is desired, colonoscopy with attention to the appendiceal orifice. 4. Fibroid uterus. 5. Bilateral femoral head avascular necrosis. Electronically Signed   By: Abigail Miyamoto M.D.   On: 01/28/2018 12:44       Thank  you for the consultation and for allowing [facility] to assist in the care of your patient. Our recommendations are noted above.  Please contact us if we can be of further service.   Marda Stalker, M.D., F.C.C.P.  Board Certified in Internal Medicine, Pulmonary Medicine, [place], and Sleep Medicine.  [facility] Office Number: [phone]   01/30/2018

## 2018-01-30 NOTE — Progress Notes (Signed)
Hematology/Oncology Consult note [facility] Telephone:([phone] [phone]  Patient Care Team: Juluis Pitch, MD as PCP - General (Family Medicine) Telford Nab, RN as Registered Nurse   Name of the patient: Laura Booth  [id number]  [birth year]    Reason for referral- Lung mass   Referring physician- Dr. Lovie Macadamia  Date of visit: 01/30/18   History of presenting illness-patient is a 57 year old female with a past medical history significant for left ventricular thrombus about 8 years ago for which she is on Coumadin, CHF, COPD, hypertension and diabetes among other medical problems.  She smoked about 1-2 pack's per day for about 15 years.  She says that currently she has not smoked for the last 1 week.  She had been having ongoing symptoms of shortness of breath and was treated with oral antibiotics.  However her shortness of breath did not improve and this led to a chest x-ray followed by a CT scan in July 2019.  CT chest showed a 6 x 6.1 x 4.3 cm left upper lobe mass concerning for malignancy along with left hilar and aortopulmonary window adenopathy.  10 mm nodule in the right middle lobe.  Multiple sclerotic densities noted in the thoracic spine may represent metastatic disease.  This was followed by a PET/CT scan which showed 8.9 cm left upper lobe mass with an SUV of 16.9 along with adjacent precarinal and mediastinal adenopathy.  Multiple hypermetabolic hepatic foci suspicious for metastatic disease.  Right middle lobe pulmonary nodule not significantly hypermetabolic.  Multiple osseous metastases within the left femoral neck with an SUV of 5.4 and a sclerotic right anterior acetabular region with an SUV of 9.5.  Patient lives alone and is independent of her ADLs and IADLs.  She is not on any home oxygen.  She does report feeling fatigued and has lost about 15 pounds over the last 2 months.  She reports poor appetite.  Reports back pain which  is radiating to her bilateral arms as well as radiating to her right lower extremity.  She does not have any children and her only close family is her sister.  She has been using Tylenol and ibuprofen for her pain but has not been getting much relief.  ECOG PS- 1  Pain scale- 5   Review of systems- Review of Systems  Constitutional: Positive for malaise/fatigue and weight loss. Negative for chills and fever.       Lack of appetite  HENT: Negative for congestion, ear discharge and nosebleeds.   Eyes: Negative for blurred vision.  Respiratory: Negative for cough, hemoptysis, sputum production, shortness of breath and wheezing.   Cardiovascular: Negative for chest pain, palpitations, orthopnea and claudication.  Gastrointestinal: Negative for abdominal pain, blood in stool, constipation, diarrhea, heartburn, melena, nausea and vomiting.  Genitourinary: Negative for dysuria, flank pain, frequency, hematuria and urgency.  Musculoskeletal: Positive for back pain and joint pain. Negative for myalgias.  Skin: Negative for rash.  Neurological: Negative for dizziness, tingling, focal weakness, seizures, weakness and headaches.  Endo/Heme/Allergies: Does not bruise/bleed easily.  Psychiatric/Behavioral: Negative for depression and suicidal ideas. The patient does not have insomnia.     Allergies  Allergen Reactions  . Other Shortness Of Breath    Beta blocker     Patient Active Problem List   Diagnosis Date Noted  . PNA (pneumonia) 09/19/2017     Past Medical History:  Diagnosis Date  . Asthma   . CHF (congestive heart failure) ([place])   . COPD (  chronic obstructive pulmonary disease) ([place])   . Diabetes mellitus without complication ([place])   . Hypercholesteremia   . Hypertension   . Reflux esophagitis      History reviewed. No pertinent surgical history.  Social History   Socioeconomic History  . Marital status: Single    Spouse name: Not on file  . Number of children: Not on  file  . Years of education: Not on file  . Highest education level: Not on file  Occupational History  . Not on file  Social Needs  . Financial resource strain: Not on file  . Food insecurity:    Worry: Not on file    Inability: Not on file  . Transportation needs:    Medical: Not on file    Non-medical: Not on file  Tobacco Use  . Smoking status: Current Every Day Smoker  . Smokeless tobacco: Never Used  Substance and Sexual Activity  . Alcohol use: No    Frequency: Never  . Drug use: No  . Sexual activity: Not on file  Lifestyle  . Physical activity:    Days per week: Not on file    Minutes per session: Not on file  . Stress: Not on file  Relationships  . Social connections:    Talks on phone: Not on file    Gets together: Not on file    Attends religious service: Not on file    Active member of club or organization: Not on file    Attends meetings of clubs or organizations: Not on file    Relationship status: Not on file  . Intimate partner violence:    Fear of current or ex partner: Not on file    Emotionally abused: Not on file    Physically abused: Not on file    Forced sexual activity: Not on file  Other Topics Concern  . Not on file  Social History Narrative  . Not on file     History reviewed. No pertinent family history.   Current Outpatient Medications:  .  ADVAIR DISKUS 500-50 MCG/DOSE AEPB, Inhale 1 puff into the lungs every 12 (twelve) hours., Disp: , Rfl: 0 .  atorvastatin (LIPITOR) 40 MG tablet, Take 40 mg by mouth daily. , Disp: , Rfl:  .  fexofenadine (ALLEGRA) 180 MG tablet, Take 180 mg by mouth daily., Disp: , Rfl:  .  ipratropium-albuterol (DUONEB) 0.5-2.5 (3) MG/3ML SOLN, Take 3 mLs by nebulization every 4 (four) hours., Disp: 360 mL, Rfl: 0 .  magnesium oxide (MAG-OX) 400 MG tablet, Take 1 tablet by mouth 2 (two) times daily., Disp: , Rfl: 2 .  metFORMIN (GLUCOPHAGE-XR) 750 MG 24 hr tablet, Take 1 tablet by mouth 2 (two) times daily., Disp:  , Rfl: 3 .  montelukast (SINGULAIR) 10 MG tablet, Take 10 mg by mouth daily., Disp: , Rfl: 3 .  omeprazole (PRILOSEC) 20 MG capsule, Take 20 mg by mouth daily., Disp: , Rfl:  .  predniSONE (STERAPRED UNI-PAK 21 TAB) 10 MG (21) TBPK tablet, Take 6 tabs first day, 5 tab on day 2, then 4 on day 3rd, 3 tabs on day 4th , 2 tab on day 5th, and 1 tab on 6th day., Disp: 21 tablet, Rfl: 0 .  SPIRIVA HANDIHALER 18 MCG inhalation capsule, Place 1 puff into inhaler and inhale daily., Disp: , Rfl: 0 .  warfarin (COUMADIN) 6 MG tablet, Take 1 tablet (6 mg total) by mouth daily at 6 PM. TAKE 10 MG BY MOUTH  ON Monday AND 7.5MG  DAILY ALL OTHER DAYS, Disp: 30 tablet, Rfl: 0 .  guaiFENesin-dextromethorphan (ROBITUSSIN DM) 100-10 MG/5ML syrup, Take 15 mLs by mouth every 4 (four) hours as needed for cough. (Patient not taking: Reported on 01/30/2018), Disp: 118 mL, Rfl: 0 .  nicotine (NICODERM CQ - DOSED IN MG/24 HOURS) 21 mg/24hr patch, Place 1 patch (21 mg total) onto the skin daily. (Patient not taking: Reported on 01/30/2018), Disp: 28 patch, Rfl: 0 .  PROAIR HFA 108 (90 Base) MCG/ACT inhaler, Inhale 2 puffs into the lungs 4 (four) times daily as needed. (Patient not taking: Reported on 01/30/2018), Disp: 18 g, Rfl: 1   Physical exam:  Vitals:   01/30/18 0845  BP: 118/81  Pulse: (!) 102  Resp: 18  Temp: (!) 97.4 F (36.3 C)  TempSrc: Tympanic  SpO2: 95%  Weight: 170 lb 3.2 oz (77.2 kg)  Height: 5\' 4"  (1.626 m)   Physical Exam  Constitutional: She is oriented to person, place, and time. She appears well-developed and well-nourished.  HENT:  Head: Normocephalic and atraumatic.  Eyes: Pupils are equal, round, and reactive to light. EOM are normal.  Neck: Normal range of motion.  Cardiovascular: Normal rate, regular rhythm and normal heart sounds.  Pulmonary/Chest: Effort normal and breath sounds normal.  Abdominal: Soft. Bowel sounds are normal.  Neurological: She is alert and oriented to person, place,  and time.  Grossly no FND  Skin: Skin is warm and dry.       CMP Latest Ref Rng & Units 01/20/2018  Glucose 65 - 99 mg/dL -  BUN 6 - 20 mg/dL -  Creatinine 0.44 - 1.00 mg/dL 0.50  Sodium 135 - 145 mmol/L -  Potassium 3.5 - 5.1 mmol/L -  Chloride 101 - 111 mmol/L -  CO2 22 - 32 mmol/L -  Calcium 8.9 - 10.3 mg/dL -  Total Protein g/dL -  Total Bilirubin mg/dL -  Alkaline Phos U/L -  AST U/L -  ALT U/L -   CBC Latest Ref Rng & Units 09/22/2017  WBC 3.6 - 11.0 K/uL 20.2(H)  Hemoglobin 12.0 - 16.0 g/dL 12.1  Hematocrit 35.0 - 47.0 % 38.2  Platelets 150 - 440 K/uL 423    No images are attached to the encounter.  Ct Chest W Contrast  Result Date: 01/20/2018 CLINICAL DATA:  Shortness of breath, pneumonia. EXAM: CT CHEST WITH CONTRAST TECHNIQUE: Multidetector CT imaging of the chest was performed during intravenous contrast administration. CONTRAST:  85mL OMNIPAQUE IOHEXOL 300 MG/ML  SOLN COMPARISON:  Radiographs of September 22, 2017. FINDINGS: Cardiovascular: Atherosclerosis of thoracic aorta is noted without aneurysm or dissection. Normal cardiac size. No pericardial effusion is noted. Minimal coronary artery calcifications are noted. Mediastinum/Nodes: 1.9 cm left hilar lymph node is noted. 1.6 cm adenopathy seen in aortopulmonary window. Esophagus is unremarkable. 2.6 cm right thyroid nodule is noted. Lungs/Pleura: Emphysematous disease is noted in both lungs. No pneumothorax or pleural effusion is noted. 10 mm nodule is seen in right middle lobe concerning for metastatic lesion. 6.2 x 6.1 x 4.3 cm irregular mass is noted in the left upper lobe adjacent to major fissure concerning for malignancy. Upper Abdomen: 1.5 cm peripherally enhancing low density is noted in posterior segment of right hepatic lobe which most likely represents hemangioma. No other definite abnormality seen in the upper abdomen. Musculoskeletal: Sclerotic densities are seen throughout the thoracic spine which may represent  metastatic disease. IMPRESSION: 6.2 x 6.1 x 4.3 cm left upper lobe mass  is noted concerning for malignancy. Left hilar and aortopulmonary window adenopathy is noted consistent with metastatic disease. 10 mm nodule is seen in right middle lobe concerning for metastatic lesion. Multiple sclerotic densities are noted in thoracic spine which may represent metastatic disease. PET scan is recommended for further evaluation. These results will be called to the ordering clinician or representative by the Radiologist Assistant, and communication documented in the PACS or zVision Dashboard. 1.5 cm peripherally enhancing abnormality low density seen in posterior segment of right hepatic lobe which may represent hemangioma, but metastatic disease cannot be excluded. 2.6 cm right thyroid nodule is noted. Thyroid ultrasound is recommended for further evaluation. Minimal coronary artery calcifications are noted. Aortic Atherosclerosis (ICD10-I70.0) and Emphysema (ICD10-J43.9). Electronically Signed   By: Marijo Conception, M.D.   On: 01/20/2018 15:26   Nm Pet Image Initial (pi) Skull Base To Thigh  Result Date: 01/28/2018 CLINICAL DATA:  Initial treatment strategy for CT demonstrating findings suspicious for left upper lobe primary bronchogenic carcinoma. EXAM: NUCLEAR MEDICINE PET SKULL BASE TO THIGH TECHNIQUE: 9.8 mCi F-18 FDG was injected intravenously. Full-ring PET imaging was performed from the skull base to thigh after the radiotracer. CT data was obtained and used for attenuation correction and anatomic localization. Fasting blood glucose: 126 mg/dl COMPARISON:  Chest CT 01/20/2018. Chest radiographs including 09/22/2017. FINDINGS: Mediastinal blood pool activity: SUV max 1.7 NECK: No areas of abnormal hypermetabolism. Incidental CT findings: No cervical adenopathy. Bilateral carotid atherosclerosis. CHEST: Hypermetabolism corresponding to the previously described central left upper lobe lung mass. This measures on the  order of 8.9 cm and a S.U.V. max of 16.9 on 79/3. Adjacent prevascular adenopathy measures a S.U.V. max of 6.2 on 75/3. More equivocal precarinal node of 9 mm and a S.U.V. max of 2.9 on 79/3. Subcarinal node of 12 mm and a S.U.V. max of 3.8. Incidental CT findings: Moderate centrilobular emphysema. Right middle lobe 10 mm pulmonary nodule is not significantly hypermetabolic, but at the low end of PET resolution. Example image 91/3. Left main coronary artery atherosclerosis. ABDOMEN/PELVIS: Multiple hypermetabolic hepatic foci, suspicious for metastatic disease. Examples at a S.U.V. max of 5.1 in the right hepatic lobe on image 136/3 and at a S.U.V. max of 6.6 more inferiorly in the right hepatic lobe including on image 156/3. These are relatively CT occult on this nondedicated nonenhanced motion degraded CT. Incidental CT findings: 3.1 cm left renal cyst. Abdominal aortic atherosclerosis. Grossly normal adrenal glands. Suspect soft tissue fullness about the distal appendix, including on image 189/3. No surrounding inflammation. Fibroid uterus. Probable nabothian cysts. Mild pelvic floor laxity. SKELETON: Multifocal osseous metastasis as evidenced by hypermetabolism. Example within the posterior left femoral neck at a S.U.V. max of 5.4. A sclerotic right anterior acetabular lesion measures a S.U.V. max of 9.5 on 217/3. Incidental CT findings: Bilateral femoral head avascular necrosis. IMPRESSION: 1. 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 2. Aortic atherosclerosis (ICD10-I70.0), coronary artery atherosclerosis and emphysema (ICD10-J43.9). 3. Suspicion of soft tissue fullness about the distal appendix. Correlate with any symptoms to suggest acute appendicitis (felt unlikely). This appearance can be seen with appendiceal mucocele. This could be re-evaluated on follow-up imaging or if more complete characterization is desired, colonoscopy  with attention to the appendiceal orifice. 4. Fibroid uterus. 5. Bilateral femoral head avascular necrosis. Electronically Signed   By: Abigail Miyamoto M.D.   On: 01/28/2018 12:44   US Pelvic Complete With Transvaginal  Result Date:  01/20/2018 CLINICAL DATA:  Initial evaluation for pelvic mass, seen on lumbar spine x-ray EXAM: TRANSABDOMINAL AND TRANSVAGINAL ULTRASOUND OF PELVIS TECHNIQUE: Both transabdominal and transvaginal ultrasound examinations of the pelvis were performed. Transabdominal technique was performed for global imaging of the pelvis including uterus, ovaries, adnexal regions, and pelvic cul-de-sac. It was necessary to proceed with endovaginal exam following the transabdominal exam to visualize the uterus, endometrium, and ovaries. COMPARISON:  Prior ultrasound from 09/10/2007 FINDINGS: Uterus Measurements: 9.4 x 7.0 x 8.5 cm. 6.3 x 5.0 x 5.4 cm fibroid position within the mid uterine body. Associated shadowing calcification. Additional 2.6 x 2.8 x 2.7 cm fibroid present at the left uterine fundus. Endometrium Not visualized due to overlying fibroid. Right ovary 3.8 x 2.0 x 1.4 cm oblong hypoechoic structure within the left adnexa, which may reflect the native right ovary versus an exophytic uterine fibroid. No other concerning adnexal mass. Left ovary 3.8 x 1.8 x 1.7 cm round structure within the left adnexa, which may reflect the native left ovary versus an exophytic uterine fibroid. No other concerning adnexal mass. Other findings No abnormal free fluid. IMPRESSION: 1. Fibroid uterus as above. 2. Ovaries not visualized with certainty due to adjacent uterine fibroids. No other adnexal mass. Electronically Signed   By: Jeannine Boga M.D.   On: 01/20/2018 15:28    Assessment and plan- Patient is a 57 y.o. female with newly diagnosed 8.9 cm left upper lobe mass along with mediastinal adenopathy as well as bone and liver metastases highly concerning for metastatic lung cancer  I have  personally reviewed CT chest and PET/CT images independently and discussed findings with the patient.  Patient noted to have a large left upper lobe lung mass along with mediastinal adenopathy as well as multiple metastatic lytic lesions in the liver as well as bone which is highly concerning for metastatic lung cancer.  Discussed types of lung cancer including non-small cell and small cell lung cancer and that treatment option defers based on which type we are dealing with.  Unfortunately given that she has liver and bone metastases this is stage IV disease and treatment at this time would be palliative and not curative.  Patient will need tissue diagnosis and will be seeing Dr. Ashby Dawes today.  She had a last echo in 2017 when her EF was 30% and she is also currently taking Coumadin for her history of left ventricular thrombus about 8 years ago.  She will therefore need to see Dr. Nehemiah Massed for cardiology clearance and to stop her Coumadin prior to her bronchoscopy.  We will also need to send tissue for Omniseq testing to see if there are any actionable mutations as well as check PDL 1 status.  Patient will also need MRI of the brain with and without contrast to complete her staging work-up.  Neoplasm related pain.  Patient reports pain in her bilateral upper extremities as well as right lower extremity.  She reports no difficulty walking.  I will go ahead and prescribe oxycodone 5 mg every 6 hours as needed for her pain and reevaluate her pain at the next visit.  No evidence of cord compression clinically  I will tentatively see her in 2 weeks time after the results of her biopsy are back to discuss further management  Thank you for this kind referral and the opportunity to participate in the care of this patient   Visit Diagnosis 1. Lung mass   2. Liver metastases ([place])   3. Bone metastases ([place])  4. Goals of care, counseling/discussion     Dr. Randa Evens, MD, MPH [facility] [phone] 01/30/2018  10:46 AM

## 2018-01-30 NOTE — Telephone Encounter (Signed)
PA required for oxycodone 5mg  tablets. PA submitted and approved. Left message with patient regarding PA approval and that can pick up prescription at pharmacy. Instructed to call back with any further questions or needs.

## 2018-02-02 ENCOUNTER — Other Ambulatory Visit: Payer: Self-pay | Admitting: *Deleted

## 2018-02-02 NOTE — Telephone Encounter (Signed)
Fax for oxycodone refill from pharmacy. Order pended for MD signature.

## 2018-02-02 NOTE — Telephone Encounter (Signed)
Sent to wrong MD. Please sign pended order,

## 2018-02-02 NOTE — Telephone Encounter (Signed)
Dr. Elroy Channel patient. Please forward script to her.

## 2018-02-05 ENCOUNTER — Other Ambulatory Visit: Payer: Self-pay | Admitting: *Deleted

## 2018-02-05 DIAGNOSIS — R918 Other nonspecific abnormal finding of lung field: Secondary | ICD-10-CM

## 2018-02-05 DIAGNOSIS — K769 Liver disease, unspecified: Secondary | ICD-10-CM

## 2018-02-10 ENCOUNTER — Other Ambulatory Visit: Payer: Self-pay | Admitting: Student

## 2018-02-10 ENCOUNTER — Other Ambulatory Visit: Payer: Self-pay | Admitting: Radiology

## 2018-02-11 ENCOUNTER — Ambulatory Visit
Admission: RE | Admit: 2018-02-11 | Discharge: 2018-02-11 | Disposition: A | Discharge status: Home / Self Care | Payer: Commercial Managed Care - PPO | Source: Ambulatory Visit | Attending: Oncology | Admitting: Oncology

## 2018-02-11 DIAGNOSIS — K769 Liver disease, unspecified: Secondary | ICD-10-CM

## 2018-02-11 DIAGNOSIS — R918 Other nonspecific abnormal finding of lung field: Secondary | ICD-10-CM

## 2018-02-11 DIAGNOSIS — I110 Hypertensive heart disease with heart failure: Secondary | ICD-10-CM | POA: Diagnosis not present

## 2018-02-11 DIAGNOSIS — C3490 Malignant neoplasm of unspecified part of unspecified bronchus or lung: Secondary | ICD-10-CM | POA: Diagnosis not present

## 2018-02-11 DIAGNOSIS — Z8673 Personal history of transient ischemic attack (TIA), and cerebral infarction without residual deficits: Secondary | ICD-10-CM | POA: Diagnosis not present

## 2018-02-11 DIAGNOSIS — E119 Type 2 diabetes mellitus without complications: Secondary | ICD-10-CM | POA: Insufficient documentation

## 2018-02-11 DIAGNOSIS — Z79899 Other long term (current) drug therapy: Secondary | ICD-10-CM | POA: Diagnosis not present

## 2018-02-11 DIAGNOSIS — Z7951 Long term (current) use of inhaled steroids: Secondary | ICD-10-CM | POA: Diagnosis not present

## 2018-02-11 DIAGNOSIS — I6523 Occlusion and stenosis of bilateral carotid arteries: Secondary | ICD-10-CM | POA: Diagnosis not present

## 2018-02-11 DIAGNOSIS — Z7984 Long term (current) use of oral hypoglycemic drugs: Secondary | ICD-10-CM | POA: Insufficient documentation

## 2018-02-11 DIAGNOSIS — C787 Secondary malignant neoplasm of liver and intrahepatic bile duct: Secondary | ICD-10-CM | POA: Diagnosis not present

## 2018-02-11 DIAGNOSIS — E7800 Pure hypercholesterolemia, unspecified: Secondary | ICD-10-CM | POA: Insufficient documentation

## 2018-02-11 DIAGNOSIS — J449 Chronic obstructive pulmonary disease, unspecified: Secondary | ICD-10-CM | POA: Diagnosis not present

## 2018-02-11 DIAGNOSIS — Z87891 Personal history of nicotine dependence: Secondary | ICD-10-CM | POA: Diagnosis not present

## 2018-02-11 DIAGNOSIS — K210 Gastro-esophageal reflux disease with esophagitis: Secondary | ICD-10-CM | POA: Insufficient documentation

## 2018-02-11 DIAGNOSIS — Z9109 Other allergy status, other than to drugs and biological substances: Secondary | ICD-10-CM | POA: Diagnosis not present

## 2018-02-11 DIAGNOSIS — E041 Nontoxic single thyroid nodule: Secondary | ICD-10-CM | POA: Insufficient documentation

## 2018-02-11 DIAGNOSIS — Z7901 Long term (current) use of anticoagulants: Secondary | ICD-10-CM | POA: Insufficient documentation

## 2018-02-11 DIAGNOSIS — I509 Heart failure, unspecified: Secondary | ICD-10-CM | POA: Insufficient documentation

## 2018-02-11 HISTORY — PX: IR IMAGING GUIDED PORT INSERTION: IMG5740

## 2018-02-11 LAB — BASIC METABOLIC PANEL
Anion gap: 9 (ref 5–15)
BUN: 12 mg/dL (ref 6–20)
CO2: 29 mmol/L (ref 22–32)
CREATININE: 0.84 mg/dL (ref 0.44–1.00)
Calcium: 9.6 mg/dL (ref 8.9–10.3)
Chloride: 89 mmol/L — ABNORMAL LOW (ref 98–111)
GLUCOSE: 136 mg/dL — AB (ref 70–99)
Potassium: 4.7 mmol/L (ref 3.5–5.1)
Sodium: 127 mmol/L — ABNORMAL LOW (ref 135–145)

## 2018-02-11 LAB — CBC
HCT: 36 % (ref 35.0–47.0)
Hemoglobin: 12.3 g/dL (ref 12.0–16.0)
MCH: 29.1 pg (ref 26.0–34.0)
MCHC: 34.1 g/dL (ref 32.0–36.0)
MCV: 85.3 fL (ref 80.0–100.0)
PLATELETS: 468 10*3/uL — AB (ref 150–440)
RBC: 4.22 MIL/uL (ref 3.80–5.20)
RDW: 14.3 % (ref 11.5–14.5)
WBC: 10.3 10*3/uL (ref 3.6–11.0)

## 2018-02-11 LAB — PROTIME-INR
INR: 0.95
Prothrombin Time: 12.6 seconds (ref 11.4–15.2)

## 2018-02-11 MED ORDER — MIDAZOLAM HCL 5 MG/5ML IJ SOLN
INTRAMUSCULAR | Status: AC
  Filled 2018-02-11: qty 5

## 2018-02-11 MED ORDER — HEPARIN SOD (PORK) LOCK FLUSH 100 UNIT/ML IV SOLN
INTRAVENOUS | Status: AC
  Filled 2018-02-11: qty 5

## 2018-02-11 MED ORDER — MIDAZOLAM HCL 5 MG/5ML IJ SOLN
INTRAMUSCULAR | Status: AC | PRN
  Administered 2018-02-11: 1 mg via INTRAVENOUS
  Administered 2018-02-11: 0.5 mg via INTRAVENOUS

## 2018-02-11 MED ORDER — SODIUM CHLORIDE 0.9 % IV SOLN
INTRAVENOUS | Status: DC
  Administered 2018-02-11: 1000 mL via INTRAVENOUS

## 2018-02-11 MED ORDER — FENTANYL CITRATE (PF) 100 MCG/2ML IJ SOLN
INTRAMUSCULAR | Status: AC | PRN
  Administered 2018-02-11: 25 ug via INTRAVENOUS
  Administered 2018-02-11: 50 ug via INTRAVENOUS

## 2018-02-11 MED ORDER — LIDOCAINE HCL (PF) 1 % IJ SOLN
INTRAMUSCULAR | Status: AC | PRN
  Administered 2018-02-11: 12 mL

## 2018-02-11 MED ORDER — LIDOCAINE HCL (PF) 1 % IJ SOLN
INTRAMUSCULAR | Status: AC
  Filled 2018-02-11: qty 30

## 2018-02-11 MED ORDER — IPRATROPIUM-ALBUTEROL 0.5-2.5 (3) MG/3ML IN SOLN
RESPIRATORY_TRACT | Status: AC
  Filled 2018-02-11: qty 3

## 2018-02-11 MED ORDER — FENTANYL CITRATE (PF) 100 MCG/2ML IJ SOLN
INTRAMUSCULAR | Status: AC
  Filled 2018-02-11: qty 2

## 2018-02-11 MED ORDER — FENTANYL CITRATE (PF) 100 MCG/2ML IJ SOLN
INTRAMUSCULAR | Status: AC
  Filled 2018-02-11: qty 4

## 2018-02-11 MED ORDER — MIDAZOLAM HCL 5 MG/5ML IJ SOLN
INTRAMUSCULAR | Status: AC | PRN
  Administered 2018-02-11 (×2): 1 mg via INTRAVENOUS

## 2018-02-11 MED ORDER — IPRATROPIUM-ALBUTEROL 0.5-2.5 (3) MG/3ML IN SOLN
3.0000 mL | Freq: Four times a day (QID) | RESPIRATORY_TRACT | Status: DC
  Administered 2018-02-11: 3 mL via RESPIRATORY_TRACT

## 2018-02-11 MED ORDER — IPRATROPIUM-ALBUTEROL 0.5-2.5 (3) MG/3ML IN SOLN
3.0000 mL | Freq: Once | RESPIRATORY_TRACT | Status: AC
  Administered 2018-02-11: 3 mL via RESPIRATORY_TRACT
  Filled 2018-02-11: qty 3

## 2018-02-11 MED ORDER — CEFAZOLIN SODIUM-DEXTROSE 2-4 GM/100ML-% IV SOLN
2.0000 g | INTRAVENOUS | Status: AC
  Administered 2018-02-11: 2 g via INTRAVENOUS

## 2018-02-11 MED ORDER — HEPARIN (PORCINE) IN NACL 1000-0.9 UT/500ML-% IV SOLN
INTRAVENOUS | Status: AC
  Filled 2018-02-11: qty 500

## 2018-02-11 NOTE — H&P (Signed)
Chief Complaint: Patient was seen in consultation today for No chief complaint on file.  at the request of Rao,Archana C  Referring Physician(s): Rao,Archana C  Supervising Physician: Marybelle Killings  Patient Status: [facility] - Out-pt  History of Present Illness: Laura Booth is a 57 y.o. female who presented wit SOB and was found to have a left lung mass, bone lesions and liver lesions. 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 She is to undergo a cardiac stress test later next month. She is cleared by cardiology for a liver biopsy, but not lung biopsy.  Past Medical History:  Diagnosis Date  . Asthma   . CHF (congestive heart failure) ([place])   . COPD (chronic obstructive pulmonary disease) ([place])   . Diabetes mellitus without complication ([place])   . Hypercholesteremia   . Hypertension   . Reflux esophagitis     History reviewed. No pertinent surgical history.  Allergies: Other  Medications: Prior to Admission medications   Medication Sig Start Date End Date Taking? Authorizing Provider  acetaminophen (TYLENOL) 500 MG tablet Take 1,000 mg by mouth every 6 (six) hours as needed.   Yes [provider]  ADVAIR DISKUS 500-50 MCG/DOSE AEPB Inhale 1 puff into the lungs every 12 (twelve) hours. 08/25/17  Yes [provider]  atorvastatin (LIPITOR) 40 MG tablet Take 40 mg by mouth daily.    Yes [provider]  fexofenadine (ALLEGRA) 180 MG tablet Take 180 mg by mouth daily.   Yes [provider]  guaiFENesin-dextromethorphan (ROBITUSSIN DM) 100-10 MG/5ML syrup Take 15 mLs by mouth every 4 (four) hours as needed for cough. 09/22/17  Yes [name], MD  ipratropium-albuterol (DUONEB) 0.5-2.5 (3) MG/3ML SOLN Take 3 mLs by nebulization every 4 (four) hours. 09/22/17  Yes [name], MD  metFORMIN (GLUCOPHAGE-XR) 750 MG 24 hr tablet Take 1 tablet by mouth 2 (two) times daily. 09/06/17  Yes [provider]  montelukast (SINGULAIR) 10 MG tablet Take 10 mg by mouth daily. 09/08/17  Yes [provider]  omeprazole (PRILOSEC) 20 MG capsule Take 20 mg by mouth daily.   Yes [provider]  oxyCODONE (OXY IR/ROXICODONE) 5 MG immediate release tablet Take 1 tablet (5 mg total) by mouth every 6 (six) hours as needed for severe pain. 01/30/18  Yes Sindy Guadeloupe, MD  PROAIR HFA 108 [phone] Base) MCG/ACT inhaler Inhale 2 puffs into the lungs 4 (four) times daily as needed. 09/22/17  Yes [name], MD  SPIRIVA HANDIHALER 18 MCG inhalation capsule Place 1 puff into inhaler and inhale daily. 08/25/17  Yes [provider]  magnesium oxide (MAG-OX) 400 MG tablet Take 1 tablet by mouth 2 (two) times daily. 09/04/17   [provider]  nicotine (NICODERM CQ - DOSED IN MG/24 HOURS) 21 mg/24hr patch Place 1 patch (21 mg total) onto the skin daily. Patient not taking: Reported on 01/30/2018 09/23/17   [name], MD  predniSONE (STERAPRED UNI-PAK 21 TAB) 10 MG (21) TBPK tablet Take 6 tabs first day, 5 tab on day 2, then 4 on day 3rd, 3 tabs on day 4th , 2 tab on day 5th, and 1 tab on 6th day. Patient not taking: Reported on 02/11/2018 09/22/17   [name], MD  warfarin (COUMADIN) 6 MG tablet Take 1 tablet (6 mg total) by mouth daily at 6 PM. TAKE 10 MG BY MOUTH ON Monday AND 7.5MG  DAILY ALL OTHER DAYS 09/22/17  [name], MD     History reviewed. No pertinent family history.  Social History   Socioeconomic History  . Marital status: Single    Spouse name: Not on file  . Number of children: Not on file  . Years of education: Not on file  . Highest education level: Not on file  Occupational History  . Not on file  Social Needs  . Financial resource strain: Not on file  . Food insecurity:    Worry: Not on file    Inability: Not on file  . Transportation needs:    Medical: Not on file    Non-medical: Not on file  Tobacco Use    . Smoking status: Former Smoker    Last attempt to quit: 01/12/2018    Years since quitting: 0.0  . Smokeless tobacco: Never Used  Substance and Sexual Activity  . Alcohol use: Yes    Frequency: Never    Comment: socially  . Drug use: No  . Sexual activity: Not on file  Lifestyle  . Physical activity:    Days per week: Not on file    Minutes per session: Not on file  . Stress: Not on file  Relationships  . Social connections:    Talks on phone: Not on file    Gets together: Not on file    Attends religious service: Not on file    Active member of club or organization: Not on file    Attends meetings of clubs or organizations: Not on file    Relationship status: Not on file  Other Topics Concern  . Not on file  Social History Narrative  . Not on file     Review of Systems: A 12 point ROS discussed and pertinent positives are indicated in the HPI above.  All other systems are negative.  Review of Systems  Vital Signs: BP 110/71   Pulse (!) 104   Temp 98.6 F (37 C) (Oral)   Resp 16   Ht 5\' 4"  (1.626 m)   Wt 170 lb (77.1 kg)   SpO2 95%   BMI 29.18 kg/m   Physical Exam  Constitutional: She is oriented to person, place, and time. She appears well-developed and well-nourished.  HENT:  Head: Normocephalic and atraumatic.  Cardiovascular: Normal rate and regular rhythm.  Pulmonary/Chest: Effort normal. She has rales.  Abdominal: Soft. She exhibits no distension.  Neurological: She is alert and oriented to person, place, and time.    Imaging: Ct Chest W Contrast  Result Date: 01/20/2018 CLINICAL DATA:  Shortness of breath, pneumonia. EXAM: CT CHEST WITH CONTRAST TECHNIQUE: Multidetector CT imaging of the chest was performed during intravenous contrast administration. CONTRAST:  51mL OMNIPAQUE IOHEXOL 300 MG/ML  SOLN COMPARISON:  Radiographs of September 22, 2017. FINDINGS: Cardiovascular: Atherosclerosis of thoracic aorta is noted without aneurysm or dissection. Normal  cardiac size. No pericardial effusion is noted. Minimal coronary artery calcifications are noted. Mediastinum/Nodes: 1.9 cm left hilar lymph node is noted. 1.6 cm adenopathy seen in aortopulmonary window. Esophagus is unremarkable. 2.6 cm right thyroid nodule is noted. Lungs/Pleura: Emphysematous disease is noted in both lungs. No pneumothorax or pleural effusion is noted. 10 mm nodule is seen in right middle lobe concerning for metastatic lesion. 6.2 x 6.1 x 4.3 cm irregular mass is noted in the left upper lobe adjacent to major fissure concerning for malignancy. Upper Abdomen: 1.5 cm peripherally enhancing low density is noted in posterior segment of right hepatic lobe which most likely represents  hemangioma. No other definite abnormality seen in the upper abdomen. Musculoskeletal: Sclerotic densities are seen throughout the thoracic spine which may represent metastatic disease. IMPRESSION: 6.2 x 6.1 x 4.3 cm left upper lobe mass is noted concerning for malignancy. Left hilar and aortopulmonary window adenopathy is noted consistent with metastatic disease. 10 mm nodule is seen in right middle lobe concerning for metastatic lesion. Multiple sclerotic densities are noted in thoracic spine which may represent metastatic disease. PET scan is recommended for further evaluation. These results will be called to the ordering clinician or representative by the Radiologist Assistant, and communication documented in the PACS or zVision Dashboard. 1.5 cm peripherally enhancing abnormality low density seen in posterior segment of right hepatic lobe which may represent hemangioma, but metastatic disease cannot be excluded. 2.6 cm right thyroid nodule is noted. Thyroid ultrasound is recommended for further evaluation. Minimal coronary artery calcifications are noted. Aortic Atherosclerosis (ICD10-I70.0) and Emphysema (ICD10-J43.9). Electronically Signed   By: Marijo Conception, M.D.   On: 01/20/2018 15:26   Nm Pet Image Initial  (pi) Skull Base To Thigh  Result Date: 01/28/2018 CLINICAL DATA:  Initial treatment strategy for CT demonstrating findings suspicious for left upper lobe primary bronchogenic carcinoma. EXAM: NUCLEAR MEDICINE PET SKULL BASE TO THIGH TECHNIQUE: 9.8 mCi F-18 FDG was injected intravenously. Full-ring PET imaging was performed from the skull base to thigh after the radiotracer. CT data was obtained and used for attenuation correction and anatomic localization. Fasting blood glucose: 126 mg/dl COMPARISON:  Chest CT 01/20/2018. Chest radiographs including 09/22/2017. FINDINGS: Mediastinal blood pool activity: SUV max 1.7 NECK: No areas of abnormal hypermetabolism. Incidental CT findings: No cervical adenopathy. Bilateral carotid atherosclerosis. CHEST: Hypermetabolism corresponding to the previously described central left upper lobe lung mass. This measures on the order of 8.9 cm and a S.U.V. max of 16.9 on 79/3. Adjacent prevascular adenopathy measures a S.U.V. max of 6.2 on 75/3. More equivocal precarinal node of 9 mm and a S.U.V. max of 2.9 on 79/3. Subcarinal node of 12 mm and a S.U.V. max of 3.8. Incidental CT findings: Moderate centrilobular emphysema. Right middle lobe 10 mm pulmonary nodule is not significantly hypermetabolic, but at the low end of PET resolution. Example image 91/3. Left main coronary artery atherosclerosis. ABDOMEN/PELVIS: Multiple hypermetabolic hepatic foci, suspicious for metastatic disease. Examples at a S.U.V. max of 5.1 in the right hepatic lobe on image 136/3 and at a S.U.V. max of 6.6 more inferiorly in the right hepatic lobe including on image 156/3. These are relatively CT occult on this nondedicated nonenhanced motion degraded CT. Incidental CT findings: 3.1 cm left renal cyst. Abdominal aortic atherosclerosis. Grossly normal adrenal glands. Suspect soft tissue fullness about the distal appendix, including on image 189/3. No surrounding inflammation. Fibroid uterus. Probable  nabothian cysts. Mild pelvic floor laxity. SKELETON: Multifocal osseous metastasis as evidenced by hypermetabolism. Example within the posterior left femoral neck at a S.U.V. max of 5.4. A sclerotic right anterior acetabular lesion measures a S.U.V. max of 9.5 on 217/3. Incidental CT findings: Bilateral femoral head avascular necrosis. IMPRESSION: 1. 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 2. Aortic atherosclerosis (ICD10-I70.0), coronary artery atherosclerosis and emphysema (ICD10-J43.9). 3. Suspicion of soft tissue fullness about the distal appendix. Correlate with any symptoms to suggest acute appendicitis (felt unlikely). This appearance can be seen with appendiceal mucocele. This could be re-evaluated on follow-up imaging or if more complete characterization is desired, colonoscopy with attention to the  appendiceal orifice. 4. Fibroid uterus. 5. Bilateral femoral head avascular necrosis. Electronically Signed   By: Abigail Miyamoto M.D.   On: 01/28/2018 12:44   US Pelvic Complete With Transvaginal  Result Date: 01/20/2018 CLINICAL DATA:  Initial evaluation for pelvic mass, seen on lumbar spine x-ray EXAM: TRANSABDOMINAL AND TRANSVAGINAL ULTRASOUND OF PELVIS TECHNIQUE: Both transabdominal and transvaginal ultrasound examinations of the pelvis were performed. Transabdominal technique was performed for global imaging of the pelvis including uterus, ovaries, adnexal regions, and pelvic cul-de-sac. It was necessary to proceed with endovaginal exam following the transabdominal exam to visualize the uterus, endometrium, and ovaries. COMPARISON:  Prior ultrasound from 09/10/2007 FINDINGS: Uterus Measurements: 9.4 x 7.0 x 8.5 cm. 6.3 x 5.0 x 5.4 cm fibroid position within the mid uterine body. Associated shadowing calcification. Additional 2.6 x 2.8 x 2.7 cm fibroid present at the left uterine fundus. Endometrium Not visualized due to  overlying fibroid. Right ovary 3.8 x 2.0 x 1.4 cm oblong hypoechoic structure within the left adnexa, which may reflect the native right ovary versus an exophytic uterine fibroid. No other concerning adnexal mass. Left ovary 3.8 x 1.8 x 1.7 cm round structure within the left adnexa, which may reflect the native left ovary versus an exophytic uterine fibroid. No other concerning adnexal mass. Other findings No abnormal free fluid. IMPRESSION: 1. Fibroid uterus as above. 2. Ovaries not visualized with certainty due to adjacent uterine fibroids. No other adnexal mass. Electronically Signed   By: Jeannine Boga M.D.   On: 01/20/2018 15:28    Labs:  CBC: Recent Labs    09/19/17 0959 09/20/17 0339 09/22/17 0424 02/11/18 1002  WBC 12.8* 14.4* 20.2* 10.3  HGB 12.4 11.9* 12.1 12.3  HCT 38.4 37.1 38.2 36.0  PLT 379 344 423 468*    COAGS: Recent Labs    09/20/17 0339 09/21/17 0435 09/22/17 0424 02/11/18 1002  INR 8.70* 3.86 2.58 0.95    BMP: Recent Labs    09/19/17 0959 09/20/17 0339 09/22/17 0424 01/20/18 1109  NA 132* 135 137  --   K 4.7 5.5* 5.0  --   CL 96* 101 97*  --   CO2 23 25 28   --   GLUCOSE 124* 197* 253*  --   BUN 23* 20 25*  --   CALCIUM 9.3 9.4 9.8  --   CREATININE 1.11* 0.69 0.86 0.50  GFRNONAA 54* >60 >60  --   GFRAA >60 >60 >60  --     LIVER FUNCTION TESTS: No results for input(s): BILITOT, AST, ALT, ALKPHOS, PROT, ALBUMIN in the last 8760 hours.  TUMOR MARKERS: No results for input(s): AFPTM, CEA, CA199, CHROMGRNA in the last 8760 hours.  Assessment and Plan:  Liver mass and lung mass. Liver biopsy and port to follow.  Thank you for this interesting consult.  I greatly enjoyed meeting Laura Booth and look forward to participating in their care.  A copy of this report was sent to the requesting provider on this date.  Electronically Signed: Tonilynn Bieker, ART A, MD 02/11/2018, 10:28 AM   I spent a total of  40 Minutes   in face to face in  clinical consultation, greater than 50% of which was counseling/coordinating care for liver biopsy and port placement.

## 2018-02-11 NOTE — Procedures (Signed)
RIJV PAC SVC RA EBL 0 Comp 0 

## 2018-02-13 ENCOUNTER — Other Ambulatory Visit: Payer: Self-pay | Admitting: *Deleted

## 2018-02-13 ENCOUNTER — Ambulatory Visit
Admission: RE | Admit: 2018-02-13 | Discharge: 2018-02-13 | Disposition: A | Discharge status: Home / Self Care | Payer: Commercial Managed Care - PPO | Source: Ambulatory Visit | Attending: Oncology | Admitting: Oncology

## 2018-02-13 DIAGNOSIS — Z8673 Personal history of transient ischemic attack (TIA), and cerebral infarction without residual deficits: Secondary | ICD-10-CM | POA: Diagnosis not present

## 2018-02-13 DIAGNOSIS — G9389 Other specified disorders of brain: Secondary | ICD-10-CM | POA: Insufficient documentation

## 2018-02-13 DIAGNOSIS — C7989 Secondary malignant neoplasm of other specified sites: Secondary | ICD-10-CM | POA: Insufficient documentation

## 2018-02-13 DIAGNOSIS — C801 Malignant (primary) neoplasm, unspecified: Secondary | ICD-10-CM | POA: Insufficient documentation

## 2018-02-13 DIAGNOSIS — C7931 Secondary malignant neoplasm of brain: Secondary | ICD-10-CM | POA: Diagnosis not present

## 2018-02-13 DIAGNOSIS — R918 Other nonspecific abnormal finding of lung field: Secondary | ICD-10-CM

## 2018-02-13 DIAGNOSIS — I6782 Cerebral ischemia: Secondary | ICD-10-CM | POA: Diagnosis not present

## 2018-02-13 LAB — SURGICAL PATHOLOGY

## 2018-02-13 MED ORDER — GADOBENATE DIMEGLUMINE 529 MG/ML IV SOLN
15.0000 mL | Freq: Once | INTRAVENOUS | Status: AC | PRN
  Administered 2018-02-13: 10 mL via INTRAVENOUS

## 2018-02-16 ENCOUNTER — Telehealth: Payer: Self-pay | Admitting: *Deleted

## 2018-02-16 DIAGNOSIS — C7931 Secondary malignant neoplasm of brain: Secondary | ICD-10-CM | POA: Insufficient documentation

## 2018-02-16 DIAGNOSIS — Z7189 Other specified counseling: Secondary | ICD-10-CM | POA: Insufficient documentation

## 2018-02-16 DIAGNOSIS — C3412 Malignant neoplasm of upper lobe, left bronchus or lung: Secondary | ICD-10-CM | POA: Insufficient documentation

## 2018-02-16 NOTE — Telephone Encounter (Signed)
Called report  CLINICAL DATA:  Metastatic lung cancer. History of hypertension, hypercholesterolemia.  EXAM: MRI HEAD WITHOUT AND WITH CONTRAST  TECHNIQUE: Multiplanar, multiecho pulse sequences of the brain and surrounding structures were obtained without and with intravenous contrast.  CONTRAST:  4mL MULTIHANCE GADOBENATE DIMEGLUMINE 529 MG/ML IV SOLN  COMPARISON:  PET-CT January 28, 2018 and CT HEAD October 23, 2014.  FINDINGS: Moderately motion degraded examination.  INTRACRANIAL CONTENTS: 4 enhancing lesions with proportional FLAIR T2 hyperintense vasogenic edema as follows:  RIGHT cerebellum 17 x 22 mm (series 13, image 54),  RIGHT cerebellum 6 x 9 mm (series 13, image 59),  Pituitary infundibulum 6 x 8 mm (series 13, image 69),  LEFT temporal lobe 9 x 10 mm (series 13, image 58).  No midline shift or significant mass effect. LEFT frontal, biparietal lobe encephalomalacia. Old small bilateral cerebellar infarcts. 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 Patchy supratentorial white matter FLAIR T2 hyperintensities. No abnormal extra-axial fluid collections.  VASCULAR: Normal major intracranial vascular flow voids present at skull base.  SKULL AND UPPER CERVICAL SPINE: No abnormal sellar expansion. No suspicious calvarial bone marrow signal. Craniocervical junction maintained.  SINUSES/ORBITS: RIGHT sphenoid sinus mucosal retention cyst. No paranasal sinus air-fluid levels. Mastoid air cells are well aerated.The included ocular globes and orbital contents are non-suspicious.  OTHER: None.  IMPRESSION: 1. Moderately motion degraded examination. 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 Largest in RIGHT cerebellum measuring 22 mm. 2. 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 Old cerebellar, old LEFT thalamus and old LEFT caudate small infarcts. 3. Mild chronic small  vessel ischemic changes. 4. These results will be called to the ordering clinician or representative by the Radiologist Assistant, and communication documented in the PACS or zVision Dashboard.   Electronically Signed   By: Elon Alas M.D.   On: 02/14/2018 00:11

## 2018-02-16 NOTE — Telephone Encounter (Signed)
Reviewed. Noted.

## 2018-02-16 NOTE — Progress Notes (Signed)
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  She smoked about 1-2 pack's per day for about 15 years.  She says that currently she has not smoked for the last 1 week.  She had been having ongoing symptoms of shortness of breath and was treated with oral antibiotics.  However her shortness of breath did not improve and this led to a chest x-ray followed by a CT scan in July 2019.  CT chest showed a 6 x 6.1 x 4.3 cm left upper lobe mass concerning for malignancy along with left hilar and aortopulmonary window adenopathy.  10 mm nodule in the right middle lobe.  Multiple sclerotic densities noted in the thoracic spine may represent metastatic disease.  This was followed by a PET/CT scan which showed 8.9 cm left upper lobe mass with an SUV of 16.9 along with adjacent precarinal and mediastinal adenopathy.  Multiple hypermetabolic hepatic foci suspicious for metastatic disease.  Right middle lobe pulmonary nodule not significantly hypermetabolic.  Multiple osseous metastases within the left femoral neck with an SUV of 5.4 and a sclerotic right anterior acetabular region with an SUV of 9.5.  Patient lives alone and is independent of her ADLs and IADLs.  She is not on any home oxygen.  She does report feeling  fatigued and has lost about 15 pounds over the last 2 months.  She reports poor appetite.  Reports back pain which is radiating to her bilateral arms as well as radiating to her right lower extremity.  She does not have any children and her only close family is her sister.    Ultrasound-guided liver biopsy showed adenocarcinoma consistent with lung origin. TTF1 positive Omniseq testing is currently pending  MRI brain showed: IMPRESSION: 1. Moderately motion degraded examination. 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 Largest in RIGHT cerebellum measuring 22 mm. 2. 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 Old cerebellar, old LEFT thalamus and old LEFT caudate small infarcts. 3. Mild chronic small vessel ischemic changes.   Interval history- she feels fatigued. Appetite is poor. She has been drinking 3-4 ensure/boost daily. She reports pain in her right hip radiating down her right leg. She has been able to ambulate. Denies any falls. She has been using 1-2 oxycodone daily with tylenol and ibuprofen  ECOG PS- 1 Pain scale- 0 Opioid associated constipation- no  Review of systems- Review of Systems  Constitutional: Positive for malaise/fatigue and weight loss. Negative for chills and fever.       Lack of appetite  HENT: Negative for congestion, ear discharge and nosebleeds.   Eyes: Negative for blurred vision.  Respiratory: Negative for cough, hemoptysis, sputum production, shortness of breath and wheezing.   Cardiovascular: Negative for chest pain, palpitations, orthopnea and claudication.  Gastrointestinal: Negative for abdominal pain, blood in stool, constipation, diarrhea, heartburn, melena, nausea and vomiting.  Genitourinary: Negative for dysuria, flank pain, frequency, hematuria and urgency.  Musculoskeletal: Negative for back pain, joint pain and myalgias.  Skin: Negative for rash.  Neurological: Negative  for dizziness, tingling, focal weakness, seizures, weakness and headaches.  Endo/Heme/Allergies: Does not bruise/bleed easily.  Psychiatric/Behavioral: Negative for depression and suicidal ideas. The patient does not have insomnia.        Allergies  Allergen Reactions  . Other Shortness Of Breath    Beta blocker      Past Medical History:  Diagnosis Date  . Asthma   . CHF (congestive heart failure) ([place])   . COPD (chronic obstructive pulmonary disease) ([place])   . Diabetes mellitus without complication ([place])   . Hypercholesteremia   . Hypertension   . Reflux esophagitis      Past Surgical History:  Procedure Laterality Date  . IR IMAGING GUIDED PORT INSERTION  02/11/2018    Social History   Socioeconomic History  . Marital status: Single    Spouse name: Not on file  . Number of children: Not on file  . Years of education: Not on file  . Highest education level: Not on file  Occupational History  . Not on file  Social Needs  . Financial resource strain: Not on file  . Food insecurity:    Worry: Not on file    Inability: Not on file  . Transportation needs:    Medical: Not on file    Non-medical: Not on file  Tobacco Use  . Smoking status: Former Smoker    Last attempt to quit: 01/12/2018    Years since quitting: 0.0  . Smokeless tobacco: Never Used  Substance and Sexual Activity  . Alcohol use: Yes    Frequency: Never    Comment: socially  . Drug use: No  . Sexual activity: Not on file  Lifestyle  . Physical activity:    Days per week: Not on file    Minutes per session: Not on file  . Stress: Not on file  Relationships  . Social connections:    Talks on phone: Not on file    Gets together: Not on file    Attends religious service: Not on file    Active member of club or organization: Not on file    Attends meetings of clubs or organizations: Not on file    Relationship status: Not on file  . Intimate partner violence:    Fear of current or ex  partner: Not on file    Emotionally abused: Not on file    Physically abused: Not on file    Forced sexual activity: Not on file  Other Topics Concern  . Not on file  Social History Narrative  . Not on file    No family history on file.   Current Outpatient Medications:  .  acetaminophen (TYLENOL) 500 MG tablet, Take 1,000 mg by mouth every 6 (six) hours as needed., Disp: , Rfl:  .  ADVAIR DISKUS 500-50 MCG/DOSE AEPB, Inhale 1 puff into the lungs every 12 (twelve) hours., Disp: , Rfl: 0 .  atorvastatin (LIPITOR) 40 MG tablet, Take 40 mg by mouth daily. , Disp: , Rfl:  .  fexofenadine (ALLEGRA) 180 MG tablet, Take 180 mg by mouth daily., Disp: , Rfl:  .  guaiFENesin-dextromethorphan (ROBITUSSIN DM) 100-10 MG/5ML syrup, Take 15 mLs by mouth every 4 (four) hours as needed for cough., Disp: 118 mL, Rfl: 0 .  ipratropium-albuterol (DUONEB) 0.5-2.5 (3) MG/3ML SOLN, Take 3 mLs  by nebulization every 4 (four) hours., Disp: 360 mL, Rfl: 0 .  magnesium oxide (MAG-OX) 400 MG tablet, Take 1 tablet by mouth 2 (two) times daily., Disp: , Rfl: 2 .  metFORMIN (GLUCOPHAGE-XR) 750 MG 24 hr tablet, Take 1 tablet by mouth 2 (two) times daily., Disp: , Rfl: 3 .  montelukast (SINGULAIR) 10 MG tablet, Take 10 mg by mouth daily., Disp: , Rfl: 3 .  nicotine (NICODERM CQ - DOSED IN MG/24 HOURS) 21 mg/24hr patch, Place 1 patch (21 mg total) onto the skin daily. (Patient not taking: Reported on 01/30/2018), Disp: 28 patch, Rfl: 0 .  omeprazole (PRILOSEC) 20 MG capsule, Take 20 mg by mouth daily., Disp: , Rfl:  .  oxyCODONE (OXY IR/ROXICODONE) 5 MG immediate release tablet, Take 1 tablet (5 mg total) by mouth every 6 (six) hours as needed for severe pain., Disp: 60 tablet, Rfl: 0 .  predniSONE (STERAPRED UNI-PAK 21 TAB) 10 MG (21) TBPK tablet, Take 6 tabs first day, 5 tab on day 2, then 4 on day 3rd, 3 tabs on day 4th , 2 tab on day 5th, and 1 tab on 6th day. (Patient not taking: Reported on 02/11/2018), Disp: 21 tablet,  Rfl: 0 .  PROAIR HFA 108 (90 Base) MCG/ACT inhaler, Inhale 2 puffs into the lungs 4 (four) times daily as needed., Disp: 18 g, Rfl: 1 .  SPIRIVA HANDIHALER 18 MCG inhalation capsule, Place 1 puff into inhaler and inhale daily., Disp: , Rfl: 0 .  warfarin (COUMADIN) 6 MG tablet, Take 1 tablet (6 mg total) by mouth daily at 6 PM. TAKE 10 MG BY MOUTH ON Monday AND 7.5MG  DAILY ALL OTHER DAYS, Disp: 30 tablet, Rfl: 0  Physical exam:  Vitals:   02/17/18 1001  BP: 121/79  Pulse: (!) 103  Resp: 18  Temp: (!) 97.2 F (36.2 C)  TempSrc: Tympanic  Weight: 171 lb 4.8 oz (77.7 kg)  Height: 5\' 4"  (1.626 m)   Physical Exam  Constitutional: She is oriented to person, place, and time.  She is thin. Sitting in a wheelchair. No acute distress  HENT:  Head: Normocephalic and atraumatic.  Eyes: Pupils are equal, round, and reactive to light. EOM are normal.  Neck: Normal range of motion.  Cardiovascular: Normal rate, regular rhythm and normal heart sounds.  Pulmonary/Chest: Effort normal.  Mild scattered b/l wheezing  Abdominal: Soft. Bowel sounds are normal.  Neurological: She is alert and oriented to person, place, and time.  Skin: Skin is warm and dry.     CMP Latest Ref Rng & Units 02/11/2018  Glucose 70 - 99 mg/dL 136(H)  BUN 6 - 20 mg/dL 12  Creatinine 0.44 - 1.00 mg/dL 0.84  Sodium 135 - 145 mmol/L 127(L)  Potassium 3.5 - 5.1 mmol/L 4.7  Chloride 98 - 111 mmol/L 89(L)  CO2 22 - 32 mmol/L 29  Calcium 8.9 - 10.3 mg/dL 9.6  Total Protein g/dL -  Total Bilirubin mg/dL -  Alkaline Phos U/L -  AST U/L -  ALT U/L -   CBC Latest Ref Rng & Units 02/11/2018  WBC 3.6 - 11.0 K/uL 10.3  Hemoglobin 12.0 - 16.0 g/dL 12.3  Hematocrit 35.0 - 47.0 % 36.0  Platelets 150 - 440 K/uL 468(H)    No images are attached to the encounter.  Ct Chest W Contrast  Result Date: 01/20/2018 CLINICAL DATA:  Shortness of breath, pneumonia. EXAM: CT CHEST WITH CONTRAST TECHNIQUE: Multidetector CT imaging of the  chest was performed  during intravenous contrast administration. CONTRAST:  64mL OMNIPAQUE IOHEXOL 300 MG/ML  SOLN COMPARISON:  Radiographs of September 22, 2017. FINDINGS: Cardiovascular: Atherosclerosis of thoracic aorta is noted without aneurysm or dissection. Normal cardiac size. No pericardial effusion is noted. Minimal coronary artery calcifications are noted. Mediastinum/Nodes: 1.9 cm left hilar lymph node is noted. 1.6 cm adenopathy seen in aortopulmonary window. Esophagus is unremarkable. 2.6 cm right thyroid nodule is noted. Lungs/Pleura: Emphysematous disease is noted in both lungs. No pneumothorax or pleural effusion is noted. 10 mm nodule is seen in right middle lobe concerning for metastatic lesion. 6.2 x 6.1 x 4.3 cm irregular mass is noted in the left upper lobe adjacent to major fissure concerning for malignancy. Upper Abdomen: 1.5 cm peripherally enhancing low density is noted in posterior segment of right hepatic lobe which most likely represents hemangioma. No other definite abnormality seen in the upper abdomen. Musculoskeletal: Sclerotic densities are seen throughout the thoracic spine which may represent metastatic disease. IMPRESSION: 6.2 x 6.1 x 4.3 cm left upper lobe mass is noted concerning for malignancy. Left hilar and aortopulmonary window adenopathy is noted consistent with metastatic disease. 10 mm nodule is seen in right middle lobe concerning for metastatic lesion. Multiple sclerotic densities are noted in thoracic spine which may represent metastatic disease. PET scan is recommended for further evaluation. These results will be called to the ordering clinician or representative by the Radiologist Assistant, and communication documented in the PACS or zVision Dashboard. 1.5 cm peripherally enhancing abnormality low density seen in posterior segment of right hepatic lobe which may represent hemangioma, but metastatic disease cannot be excluded. 2.6 cm right thyroid nodule is noted. Thyroid  ultrasound is recommended for further evaluation. Minimal coronary artery calcifications are noted. Aortic Atherosclerosis (ICD10-I70.0) and Emphysema (ICD10-J43.9). Electronically Signed   By: Marijo Conception, M.D.   On: 01/20/2018 15:26   Mr Jeri Cos GB Contrast  Result Date: 02/14/2018 CLINICAL DATA:  Metastatic lung cancer. History of hypertension, hypercholesterolemia. EXAM: MRI HEAD WITHOUT AND WITH CONTRAST TECHNIQUE: Multiplanar, multiecho pulse sequences of the brain and surrounding structures were obtained without and with intravenous contrast. CONTRAST:  62mL MULTIHANCE GADOBENATE DIMEGLUMINE 529 MG/ML IV SOLN COMPARISON:  PET-CT January 28, 2018 and CT HEAD October 23, 2014. FINDINGS: Moderately motion degraded examination. INTRACRANIAL CONTENTS: 4 enhancing lesions with proportional FLAIR T2 hyperintense vasogenic edema as follows: RIGHT cerebellum 17 x 22 mm (series 13, image 54), RIGHT cerebellum 6 x 9 mm (series 13, image 59), Pituitary infundibulum 6 x 8 mm (series 13, image 69), LEFT temporal lobe 9 x 10 mm (series 13, image 58). No midline shift or significant mass effect. LEFT frontal, biparietal lobe encephalomalacia. Old small bilateral cerebellar infarcts. 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 Patchy supratentorial white matter FLAIR T2 hyperintensities. No abnormal extra-axial fluid collections. VASCULAR: Normal major intracranial vascular flow voids present at skull base. SKULL AND UPPER CERVICAL SPINE: No abnormal sellar expansion. No suspicious calvarial bone marrow signal. Craniocervical junction maintained. SINUSES/ORBITS: RIGHT sphenoid sinus mucosal retention cyst. No paranasal sinus air-fluid levels. Mastoid air cells are well aerated.The included ocular globes and orbital contents are non-suspicious. OTHER: None. IMPRESSION: 1. Moderately motion degraded examination. 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 Largest in RIGHT cerebellum measuring 22 mm.  2. 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 Old cerebellar, old LEFT thalamus and old LEFT caudate small infarcts. 3. Mild chronic small vessel ischemic changes. 4. These results will be called to the  ordering clinician or representative by the Radiologist Assistant, and communication documented in the PACS or zVision Dashboard. Electronically Signed   By: Elon Alas M.D.   On: 02/14/2018 00:11   Nm Pet Image Initial (pi) Skull Base To Thigh  Result Date: 01/28/2018 CLINICAL DATA:  Initial treatment strategy for CT demonstrating findings suspicious for left upper lobe primary bronchogenic carcinoma. EXAM: NUCLEAR MEDICINE PET SKULL BASE TO THIGH TECHNIQUE: 9.8 mCi F-18 FDG was injected intravenously. Full-ring PET imaging was performed from the skull base to thigh after the radiotracer. CT data was obtained and used for attenuation correction and anatomic localization. Fasting blood glucose: 126 mg/dl COMPARISON:  Chest CT 01/20/2018. Chest radiographs including 09/22/2017. FINDINGS: Mediastinal blood pool activity: SUV max 1.7 NECK: No areas of abnormal hypermetabolism. Incidental CT findings: No cervical adenopathy. Bilateral carotid atherosclerosis. CHEST: Hypermetabolism corresponding to the previously described central left upper lobe lung mass. This measures on the order of 8.9 cm and a S.U.V. max of 16.9 on 79/3. Adjacent prevascular adenopathy measures a S.U.V. max of 6.2 on 75/3. More equivocal precarinal node of 9 mm and a S.U.V. max of 2.9 on 79/3. Subcarinal node of 12 mm and a S.U.V. max of 3.8. Incidental CT findings: Moderate centrilobular emphysema. Right middle lobe 10 mm pulmonary nodule is not significantly hypermetabolic, but at the low end of PET resolution. Example image 91/3. Left main coronary artery atherosclerosis. ABDOMEN/PELVIS: Multiple hypermetabolic hepatic foci, suspicious for metastatic disease. Examples at a S.U.V. max  of 5.1 in the right hepatic lobe on image 136/3 and at a S.U.V. max of 6.6 more inferiorly in the right hepatic lobe including on image 156/3. These are relatively CT occult on this nondedicated nonenhanced motion degraded CT. Incidental CT findings: 3.1 cm left renal cyst. Abdominal aortic atherosclerosis. Grossly normal adrenal glands. Suspect soft tissue fullness about the distal appendix, including on image 189/3. No surrounding inflammation. Fibroid uterus. Probable nabothian cysts. Mild pelvic floor laxity. SKELETON: Multifocal osseous metastasis as evidenced by hypermetabolism. Example within the posterior left femoral neck at a S.U.V. max of 5.4. A sclerotic right anterior acetabular lesion measures a S.U.V. max of 9.5 on 217/3. Incidental CT findings: Bilateral femoral head avascular necrosis. IMPRESSION: 1. 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 2. Aortic atherosclerosis (ICD10-I70.0), coronary artery atherosclerosis and emphysema (ICD10-J43.9). 3. Suspicion of soft tissue fullness about the distal appendix. Correlate with any symptoms to suggest acute appendicitis (felt unlikely). This appearance can be seen with appendiceal mucocele. This could be re-evaluated on follow-up imaging or if more complete characterization is desired, colonoscopy with attention to the appendiceal orifice. 4. Fibroid uterus. 5. Bilateral femoral head avascular necrosis. Electronically Signed   By: Abigail Miyamoto M.D.   On: 01/28/2018 12:44   US Biopsy (liver)  Result Date: 02/11/2018 INDICATION: Lung mass.  Liver lesions.  Bone lesions. EXAM: ULTRASOUND-GUIDED CORE BIOPSY OF A LIVER LESION MEDICATIONS: None. ANESTHESIA/SEDATION: Fentanyl 75 mcg IV; Versed 1.5 mg IV Moderate Sedation Time:  12 minutes The patient was continuously monitored during the procedure by the interventional radiology nurse under my direct supervision. FLUOROSCOPY TIME:   Fluoroscopy Time:  minutes  seconds ( mGy). COMPLICATIONS: None immediate. PROCEDURE: Informed written consent was obtained from the patient after a thorough discussion of the procedural risks, benefits and alternatives. All questions were addressed. Maximal Sterile Barrier Technique was utilized including caps, mask, sterile gowns, sterile gloves, sterile drape, hand hygiene and skin antiseptic. A  timeout was performed prior to the initiation of the procedure. The right upper quadrant was prepped with ChloraPrep in a sterile fashion, and a sterile drape was applied covering the operative field. A sterile gown and sterile gloves were used for the procedure. Under sonographic guidance, an 17 gauge guide needle was advanced into the liver lesion adjacent to the gallbladder. Subsequently 2 18 gauge core biopsies were obtained. Gel-Foam slurry was injected into the needle tract. The guide needle was removed. Final imaging was performed. Patient tolerated the procedure well without complication. Vital sign monitoring by nursing staff during the procedure will continue as patient is in the special procedures unit for post procedure observation. FINDINGS: The images document guide needle placement within the liver lesion. Post biopsy images demonstrate no hemorrhage. IMPRESSION: Successful ultrasound-guided core biopsy of a liver lesion adjacent to the gallbladder. Electronically Signed   By: Marybelle Killings M.D.   On: 02/11/2018 12:43   US Pelvic Complete With Transvaginal  Result Date: 01/20/2018 CLINICAL DATA:  Initial evaluation for pelvic mass, seen on lumbar spine x-ray EXAM: TRANSABDOMINAL AND TRANSVAGINAL ULTRASOUND OF PELVIS TECHNIQUE: Both transabdominal and transvaginal ultrasound examinations of the pelvis were performed. Transabdominal technique was performed for global imaging of the pelvis including uterus, ovaries, adnexal regions, and pelvic cul-de-sac. It was necessary to proceed with endovaginal exam  following the transabdominal exam to visualize the uterus, endometrium, and ovaries. COMPARISON:  Prior ultrasound from 09/10/2007 FINDINGS: Uterus Measurements: 9.4 x 7.0 x 8.5 cm. 6.3 x 5.0 x 5.4 cm fibroid position within the mid uterine body. Associated shadowing calcification. Additional 2.6 x 2.8 x 2.7 cm fibroid present at the left uterine fundus. Endometrium Not visualized due to overlying fibroid. Right ovary 3.8 x 2.0 x 1.4 cm oblong hypoechoic structure within the left adnexa, which may reflect the native right ovary versus an exophytic uterine fibroid. No other concerning adnexal mass. Left ovary 3.8 x 1.8 x 1.7 cm round structure within the left adnexa, which may reflect the native left ovary versus an exophytic uterine fibroid. No other concerning adnexal mass. Other findings No abnormal free fluid. IMPRESSION: 1. Fibroid uterus as above. 2. Ovaries not visualized with certainty due to adjacent uterine fibroids. No other adnexal mass. Electronically Signed   By: Jeannine Boga M.D.   On: 01/20/2018 15:28   Ir Imaging Guided Port Insertion  Result Date: 02/11/2018 CLINICAL DATA:  Lung mass EXAM: TUNNEL POWER PORT PLACEMENT WITH SUBCUTANEOUS POCKET UTILIZING ULTRASOUND & FLOUROSCOPY FLUOROSCOPY TIME:  24 seconds.  Four mGy. MEDICATIONS AND MEDICAL HISTORY: Versed 2 mg, Fentanyl 75 mcg. Additional Medications: Ancef 2 g. Antibiotics were given within 2 hours of the procedure. ANESTHESIA/SEDATION: Moderate sedation time: 34 minutes. Nursing monitored the the patient during the procedure. PROCEDURE: After written informed consent was obtained, patient was placed in the supine position on angiographic table. The right neck and chest was prepped and draped in a sterile fashion. Lidocaine was utilized for local anesthesia. The right jugular vein was noted to be patent initially with ultrasound. Under sonographic guidance, a micropuncture needle was inserted into the right IJ vein (Ultrasound and  fluoroscopic image documentation was performed). The needle was removed over an 018 wire which was exchanged for a Amplatz. This was advanced into the IVC. An 8-French dilator was advanced over the Amplatz. A small incision was made in the right upper chest over the anterior right second rib. Utilizing blunt dissection, a subcutaneous pocket was created in the caudal direction. The pocket was irrigated  with a copious amount of sterile normal saline. The port catheter was tunneled from the chest incision, and out the neck incision. The reservoir was inserted into the subcutaneous pocket and secured with two 3-0 Ethilon stitches. A peel-away sheath was advanced over the Amplatz wire. The port catheter was cut to measure length and inserted through the peel-away sheath. The peel-away sheath was removed. The chest incision was closed with 3-0 Vicryl interrupted stitches for the subcutaneous tissue and a running of 4-0 Vicryl subcuticular stitch for the skin. The neck incision was closed with a 4-0 Vicryl subcuticular stitch. Derma-bond was applied to both surgical incisions. The port reservoir was flushed and instilled with heparinized saline. No complications. FINDINGS: A right IJ vein Port-A-Cath is in place with its tip at the cavoatrial junction. COMPLICATIONS: None IMPRESSION: Successful 8 French right internal jugular vein power port placement with its tip at the SVC/RA junction. Electronically Signed   By: Marybelle Killings M.D.   On: 02/11/2018 14:21     Assessment and plan- Patient is a 57 y.o. female with newly diagnosed adenocarcinoma of the lung stage IV [id number] with metastases to the liver brain and bone   I discussed the results of the pathology with the patient in detail.  Pathology shows adenocarcinoma consistent with lung primary.  She has significant burden of disease especially given the large lung mass as well as multiple liver metastases.  I therefore recommend combination chemo immunotherapy  with carboplatin and Alimta and Keytruda every 3 weeks for 4 cycles followed by maintenance alimta/ keytruda if she responds well.    Carboplatin will be given at AUC 5 along with Alimta at 500 mg/m and Keytruda 200 mg IV.  Discussed risks and benefits of chemotherapy including all but not limited to nausea, vomiting, low blood counts, risk of infections and hospitalizations. Omniseq testing has been sent out and we are awaiting biomarker testing.  Treatment will be given with the palliative intent.  Patient understands and agrees to proceed as planned.  She already has a port in place and she is scheduled for chemo class tomorrow and will start her first cycle of treatment in 2 days time.she will start taking folic acid 1 mg daily starting today. B12 to be given with 1st chemo.   I would not like to wait for 3 weeks for omniseq testing to come back given that patient is fairly symptomatic and the likelihood of seeing targetable mutation is low given her heavy history of smoking  I also discussed the results of the MRI brain which I have reviewed independently.  She does have 4 enhancing intracranial lesions consistent with brain metastases.  There is no significant mass-effect or midline shift noted.  I will therefore proceed with systemic therapy at this time and have her seen by radiation oncology down the line.  She can potentially start whole brain radiation after she completes 4 cycles of induction chemotherapy.  Moreover immunotherapy does have some potential to cross blood-brain barrier and could have an effect on these intracranial mets as well  I will see her back in 10 days time with CBC and CMP to see how she tolerated her chemotherapy.  I will hold off on adding Neulasta to her regimen given that she is young but if she were to have significant cytopenias with this regimen I will consider adding Neulasta with subsequent regimens.  Right hip pain: will start fentanyl patch 12 mcg. Continue  prn oxycodone. I have asked her to stop  using ibuprofen and not to exceed 3gm of total tylenol dose.  If her right hip pain worsens or if she has difficulty ambulating, I will refer her for palliative radiation at that time  Given the patient has bone metastases also discussed benefits of monthly Zometa to decrease the chances of skeletal related fractures.  Discussed risks and benefits of Zometa including all but not limited to fatigue, hypocalcemia and possible osteonecrosis of the jaw.  We will need to obtain dental clearance first before proceeding with Zometa.  Patient and family had multiple insightful questions and all of them were answered to their satisfaction   Cancer Staging Malignant neoplasm of upper lobe of left lung [facility]) Staging form: Lung, AJCC 8th Edition - Clinical stage from 02/16/2018: Stage IV (cT3, cN2, pM1c) - Signed by Sindy Guadeloupe, MD on 02/16/2018   Visit Diagnosis 1. Malignant neoplasm of upper lobe of left lung ([place])   2. Goals of care, counseling/discussion   3. Brain metastases ([place])   4. Bone metastases ([place])   5. Neoplasm related pain      Dr. Randa Evens, MD, MPH [facility] at [facility] [phone] 02/17/2018 12:55 PM

## 2018-02-17 ENCOUNTER — Inpatient Hospital Stay (HOSPITAL_BASED_OUTPATIENT_CLINIC_OR_DEPARTMENT_OTHER): Payer: Commercial Managed Care - PPO | Admitting: Oncology

## 2018-02-17 ENCOUNTER — Encounter: Payer: Self-pay | Admitting: *Deleted

## 2018-02-17 ENCOUNTER — Inpatient Hospital Stay: Payer: Commercial Managed Care - PPO

## 2018-02-17 ENCOUNTER — Encounter: Payer: Self-pay | Admitting: Oncology

## 2018-02-17 VITALS — BP 121/79 | HR 103 | Temp 97.2°F | Resp 18 | Ht 64.0 in | Wt 171.3 lb

## 2018-02-17 DIAGNOSIS — C3412 Malignant neoplasm of upper lobe, left bronchus or lung: Secondary | ICD-10-CM | POA: Diagnosis not present

## 2018-02-17 DIAGNOSIS — Z7189 Other specified counseling: Secondary | ICD-10-CM

## 2018-02-17 DIAGNOSIS — C7931 Secondary malignant neoplasm of brain: Secondary | ICD-10-CM | POA: Diagnosis not present

## 2018-02-17 DIAGNOSIS — C7951 Secondary malignant neoplasm of bone: Secondary | ICD-10-CM | POA: Diagnosis not present

## 2018-02-17 DIAGNOSIS — C787 Secondary malignant neoplasm of liver and intrahepatic bile duct: Secondary | ICD-10-CM | POA: Diagnosis not present

## 2018-02-17 DIAGNOSIS — G893 Neoplasm related pain (acute) (chronic): Secondary | ICD-10-CM

## 2018-02-17 DIAGNOSIS — R918 Other nonspecific abnormal finding of lung field: Secondary | ICD-10-CM

## 2018-02-17 LAB — COMPREHENSIVE METABOLIC PANEL
ALBUMIN: 3.3 g/dL — AB (ref 3.5–5.0)
ALK PHOS: 141 U/L — AB (ref 38–126)
ALT: 25 U/L (ref 0–44)
ANION GAP: 12 (ref 5–15)
AST: 33 U/L (ref 15–41)
BUN: 17 mg/dL (ref 6–20)
CALCIUM: 9.8 mg/dL (ref 8.9–10.3)
CHLORIDE: 93 mmol/L — AB (ref 98–111)
CO2: 26 mmol/L (ref 22–32)
Creatinine, Ser: 0.64 mg/dL (ref 0.44–1.00)
GFR calc Af Amer: >60 mL/min (ref >60)
GFR calc non Af Amer: >60 mL/min (ref >60)
GLUCOSE: 111 mg/dL — AB (ref 70–99)
Potassium: 5.1 mmol/L (ref 3.5–5.1)
SODIUM: 131 mmol/L — AB (ref 135–145)
Total Bilirubin: 0.2 mg/dL — ABNORMAL LOW (ref 0.3–1.2)
Total Protein: 7.8 g/dL (ref 6.5–8.1)

## 2018-02-17 LAB — CBC WITH DIFFERENTIAL/PLATELET
Basophils Absolute: 0.1 10*3/uL (ref 0–0.1)
Basophils Relative: 1 %
Eosinophils Absolute: 0.1 10*3/uL (ref 0–0.7)
Eosinophils Relative: 1 %
HEMATOCRIT: 34.4 % — AB (ref 35.0–47.0)
HEMOGLOBIN: 11.6 g/dL — AB (ref 12.0–16.0)
LYMPHS ABS: 1.5 10*3/uL (ref 1.0–3.6)
LYMPHS PCT: 15 %
MCH: 28.9 pg (ref 26.0–34.0)
MCHC: 33.8 g/dL (ref 32.0–36.0)
MCV: 85.7 fL (ref 80.0–100.0)
MONO ABS: 1 10*3/uL — AB (ref 0.2–0.9)
MONOS PCT: 9 %
NEUTROS ABS: 7.8 10*3/uL — AB (ref 1.4–6.5)
NEUTROS PCT: 74 %
Platelets: 325 10*3/uL (ref 150–440)
RBC: 4.02 MIL/uL (ref 3.80–5.20)
RDW: 14.3 % (ref 11.5–14.5)
WBC: 10.4 10*3/uL (ref 3.6–11.0)

## 2018-02-17 MED ORDER — FOLIC ACID 1 MG PO TABS: 1.0000 mg | ORAL_TABLET | Freq: Every day | ORAL | 3 refills | Status: DC

## 2018-02-17 MED ORDER — DEXAMETHASONE 4 MG PO TABS: ORAL_TABLET | ORAL | 1 refills | Status: DC

## 2018-02-17 MED ORDER — LORAZEPAM 0.5 MG PO TABS: 0.5000 mg | ORAL_TABLET | Freq: Four times a day (QID) | ORAL | 0 refills | Status: DC | PRN

## 2018-02-17 MED ORDER — ONDANSETRON HCL 8 MG PO TABS: 8.0000 mg | ORAL_TABLET | Freq: Three times a day (TID) | ORAL | 1 refills | Status: AC | PRN

## 2018-02-17 MED ORDER — PROCHLORPERAZINE 25 MG RE SUPP: 25.0000 mg | Freq: Two times a day (BID) | RECTAL | 3 refills | Status: DC | PRN

## 2018-02-17 MED ORDER — FENTANYL 12 MCG/HR TD PT72: 12.5000 ug | MEDICATED_PATCH | TRANSDERMAL | 0 refills | Status: DC

## 2018-02-17 MED ORDER — FOLIC ACID 1 MG PO TABS: 1.0000 mg | ORAL_TABLET | Freq: Every day | ORAL | 6 refills | Status: DC

## 2018-02-17 MED ORDER — LIDOCAINE-PRILOCAINE 2.5-2.5 % EX CREA: TOPICAL_CREAM | CUTANEOUS | 3 refills | Status: AC

## 2018-02-17 MED ORDER — PROCHLORPERAZINE MALEATE 10 MG PO TABS: 10.0000 mg | ORAL_TABLET | Freq: Four times a day (QID) | ORAL | 1 refills | Status: DC | PRN

## 2018-02-17 NOTE — Progress Notes (Signed)
  Oncology Nurse Navigator Documentation  Navigator Location: [facility]Med Onc (02/17/18 1100)   )Navigator Encounter Type: [facility] Follow-up (02/17/18 1100)     Confirmed Diagnosis Date: 02/13/18 (02/17/18 1100)               Patient Visit Type: MedOnc (02/17/18 1100) Treatment Phase: Pre-Tx/Tx Discussion (02/17/18 1100) Barriers/Navigation Needs: Coordination of Care;Education (02/17/18 1100) Education: Understanding Cancer/ Treatment Options;Newly Diagnosed Cancer Education;Concerns with Finances/ Eligibility (02/17/18 1100) Interventions: Coordination of Care;Education (02/17/18 1100)   Coordination of Care: Appts;Chemo (02/17/18 1100) Education Method: Verbal;Written (02/17/18 1100)         met with patient during follow up visit with Dr. Janese Banks to review results from recent biopsy and brain MRI. Treatment planning discussed with patient and her two sisters. All questions answered at the time of visit. Pt given resources regarding diagnosis as well as supportive services available. Reviewed upcoming appts with patient and informed to call with any further questions or needs. Pt verbalized understanding.        Time Spent with Patient: 90 (02/17/18 1100)

## 2018-02-17 NOTE — Patient Instructions (Signed)
Pembrolizumab injection What is this medicine? PEMBROLIZUMAB (pem broe liz ue mab) is a monoclonal antibody. It is used to treat melanoma, head and neck cancer, Hodgkin lymphoma, non-small cell lung cancer, urothelial cancer, stomach cancer, and cancers that have a certain genetic condition. This medicine may be used for other purposes; ask your health care provider or pharmacist if you have questions. COMMON BRAND NAME(S): Keytruda What should I tell my health care provider before I take this medicine? They need to know if you have any of these conditions: -diabetes -immune system problems -inflammatory bowel disease -liver disease -lung or breathing disease -lupus -organ transplant -an unusual or allergic reaction to pembrolizumab, other medicines, foods, dyes, or preservatives -pregnant or trying to get pregnant -breast-feeding How should I use this medicine? This medicine is for infusion into a vein. It is given by a health care professional in a hospital or clinic setting. A special MedGuide will be given to you before each treatment. Be sure to read this information carefully each time. Talk to your pediatrician regarding the use of this medicine in children. While this drug may be prescribed for selected conditions, precautions do apply. Overdosage: If you think you have taken too much of this medicine contact a poison control center or emergency room at once. NOTE: This medicine is only for you. Do not share this medicine with others. What if I miss a dose? It is important not to miss your dose. Call your doctor or health care professional if you are unable to keep an appointment. What may interact with this medicine? Interactions have not been studied. Give your health care provider a list of all the medicines, herbs, non-prescription drugs, or dietary supplements you use. Also tell them if you smoke, drink alcohol, or use illegal drugs. Some items may interact with your  medicine. This list may not describe all possible interactions. Give your health care provider a list of all the medicines, herbs, non-prescription drugs, or dietary supplements you use. Also tell them if you smoke, drink alcohol, or use illegal drugs. Some items may interact with your medicine. What should I watch for while using this medicine? Your condition will be monitored carefully while you are receiving this medicine. You may need blood work done while you are taking this medicine. Do not become pregnant while taking this medicine or for 4 months after stopping it. Women should inform their doctor if they wish to become pregnant or think they might be pregnant. There is a potential for serious side effects to an unborn child. Talk to your health care professional or pharmacist for more information. Do not breast-feed an infant while taking this medicine or for 4 months after the last dose. What side effects may I notice from receiving this medicine? Side effects that you should report to your doctor or health care professional as soon as possible: -allergic reactions like skin rash, itching or hives, swelling of the face, lips, or tongue -bloody or black, tarry -breathing problems -changes in vision -chest pain -chills -constipation -cough -dizziness or feeling faint or lightheaded -fast or irregular heartbeat -fever -flushing -hair loss -low blood counts - this medicine may decrease the number of white blood cells, red blood cells and platelets. You may be at increased risk for infections and bleeding. -muscle pain -muscle weakness -persistent headache -signs and symptoms of high blood sugar such as dizziness; dry mouth; dry skin; fruity breath; nausea; stomach pain; increased hunger or thirst; increased urination -signs and symptoms of kidney  injury like trouble passing urine or change in the amount of urine -signs and symptoms of liver injury like dark urine, light-colored  stools, loss of appetite, nausea, right upper belly pain, yellowing of the eyes or skin -stomach pain -sweating -weight loss Side effects that usually do not require medical attention (report to your doctor or health care professional if they continue or are bothersome): -decreased appetite -diarrhea -tiredness This list may not describe all possible side effects. Call your doctor for medical advice about side effects. You may report side effects to FDA at 1-800-FDA-1088. Where should I keep my medicine? This drug is given in a hospital or clinic and will not be stored at home. NOTE: This sheet is a summary. It may not cover all possible information. If you have questions about this medicine, talk to your doctor, pharmacist, or health care provider.  2018 Elsevier/Gold Standard (2016-04-16 12:29:36) Pemetrexed injection What is this medicine? PEMETREXED (PEM e TREX ed) is a chemotherapy drug used to treat lung cancers like non-small cell lung cancer and mesothelioma. It may also be used to treat other cancers. This medicine may be used for other purposes; ask your health care provider or pharmacist if you have questions. COMMON BRAND NAME(S): Alimta What should I tell my health care provider before I take this medicine? They need to know if you have any of these conditions: -infection (especially a virus infection such as chickenpox, cold sores, or herpes) -kidney disease -low blood counts, like low white cell, platelet, or red cell counts -lung or breathing disease, like asthma -radiation therapy -an unusual or allergic reaction to pemetrexed, other medicines, foods, dyes, or preservative -pregnant or trying to get pregnant -breast-feeding How should I use this medicine? This drug is given as an infusion into a vein. It is administered in a hospital or clinic by a specially trained health care professional. Talk to your pediatrician regarding the use of this medicine in children.  Special care may be needed. Overdosage: If you think you have taken too much of this medicine contact a poison control center or emergency room at once. NOTE: This medicine is only for you. Do not share this medicine with others. What if I miss a dose? It is important not to miss your dose. Call your doctor or health care professional if you are unable to keep an appointment. What may interact with this medicine? This medicine may interact with the following medications: -Ibuprofen This list may not describe all possible interactions. Give your health care provider a list of all the medicines, herbs, non-prescription drugs, or dietary supplements you use. Also tell them if you smoke, drink alcohol, or use illegal drugs. Some items may interact with your medicine. What should I watch for while using this medicine? Visit your doctor for checks on your progress. This drug may make you feel generally unwell. This is not uncommon, as chemotherapy can affect healthy cells as well as cancer cells. Report any side effects. Continue your course of treatment even though you feel ill unless your doctor tells you to stop. In some cases, you may be given additional medicines to help with side effects. Follow all directions for their use. Call your doctor or health care professional for advice if you get a fever, chills or sore throat, or other symptoms of a cold or flu. Do not treat yourself. This drug decreases your body's ability to fight infections. Try to avoid being around people who are sick. This medicine may increase your  risk to bruise or bleed. Call your doctor or health care professional if you notice any unusual bleeding. Be careful brushing and flossing your teeth or using a toothpick because you may get an infection or bleed more easily. If you have any dental work done, tell your dentist you are receiving this medicine. Avoid taking products that contain aspirin, acetaminophen, ibuprofen, naproxen,  or ketoprofen unless instructed by your doctor. These medicines may hide a fever. Call your doctor or health care professional if you get diarrhea or mouth sores. Do not treat yourself. To protect your kidneys, drink water or other fluids as directed while you are taking this medicine. Do not become pregnant while taking this medicine or for 6 months after stopping it. Women should inform their doctor if they wish to become pregnant or think they might be pregnant. Men should not father a child while taking this medicine and for 3 months after stopping it. This may interfere with the ability to father a child. You should talk to your doctor or health care professional if you are concerned about your fertility. There is a potential for serious side effects to an unborn child. Talk to your health care professional or pharmacist for more information. Do not breast-feed an infant while taking this medicine or for 1 week after stopping it. What side effects may I notice from receiving this medicine? Side effects that you should report to your doctor or health care professional as soon as possible: -allergic reactions like skin rash, itching or hives, swelling of the face, lips, or tongue -breathing problems -redness, blistering, peeling or loosening of the skin, including inside the mouth -signs and symptoms of bleeding such as bloody or black, tarry stools; red or dark-brown urine; spitting up blood or brown material that looks like coffee grounds; red spots on the skin; unusual bruising or bleeding from the eye, gums, or nose -signs and symptoms of infection like fever or chills; cough; sore throat; pain or trouble passing urine -signs and symptoms of kidney injury like trouble passing urine or change in the amount of urine -signs and symptoms of liver injury like dark yellow or brown urine; general ill feeling or flu-like symptoms; light-colored stools; loss of appetite; nausea; right upper belly pain;  unusually weak or tired; yellowing of the eyes or skin Side effects that usually do not require medical attention (report to your doctor or health care professional if they continue or are bothersome): -constipation -dizziness -mouth sores -nausea, vomiting -pain, tingling, numbness in the hands or feet -unusually weak or tired This list may not describe all possible side effects. Call your doctor for medical advice about side effects. You may report side effects to FDA at 1-800-FDA-1088. Where should I keep my medicine? This drug is given in a hospital or clinic and will not be stored at home. NOTE: This sheet is a summary. It may not cover all possible information. If you have questions about this medicine, talk to your doctor, pharmacist, or health care provider.  2018 Elsevier/Gold Standard (2016-05-07 18:51:46) Carboplatin injection What is this medicine? CARBOPLATIN (KAR boe pla tin) is a chemotherapy drug. It targets fast dividing cells, like cancer cells, and causes these cells to die. This medicine is used to treat ovarian cancer and many other cancers. This medicine may be used for other purposes; ask your health care provider or pharmacist if you have questions. COMMON BRAND NAME(S): Paraplatin What should I tell my health care provider before I take this medicine?  They need to know if you have any of these conditions: -blood disorders -hearing problems -kidney disease -recent or ongoing radiation therapy -an unusual or allergic reaction to carboplatin, cisplatin, other chemotherapy, other medicines, foods, dyes, or preservatives -pregnant or trying to get pregnant -breast-feeding How should I use this medicine? This drug is usually given as an infusion into a vein. It is administered in a hospital or clinic by a specially trained health care professional. Talk to your pediatrician regarding the use of this medicine in children. Special care may be needed. Overdosage: If you  think you have taken too much of this medicine contact a poison control center or emergency room at once. NOTE: This medicine is only for you. Do not share this medicine with others. What if I miss a dose? It is important not to miss a dose. Call your doctor or health care professional if you are unable to keep an appointment. What may interact with this medicine? -medicines for seizures -medicines to increase blood counts like filgrastim, pegfilgrastim, sargramostim -some antibiotics like amikacin, gentamicin, neomycin, streptomycin, tobramycin -vaccines Talk to your doctor or health care professional before taking any of these medicines: -acetaminophen -aspirin -ibuprofen -ketoprofen -naproxen This list may not describe all possible interactions. Give your health care provider a list of all the medicines, herbs, non-prescription drugs, or dietary supplements you use. Also tell them if you smoke, drink alcohol, or use illegal drugs. Some items may interact with your medicine. What should I watch for while using this medicine? Your condition will be monitored carefully while you are receiving this medicine. You will need important blood work done while you are taking this medicine. This drug may make you feel generally unwell. This is not uncommon, as chemotherapy can affect healthy cells as well as cancer cells. Report any side effects. Continue your course of treatment even though you feel ill unless your doctor tells you to stop. In some cases, you may be given additional medicines to help with side effects. Follow all directions for their use. Call your doctor or health care professional for advice if you get a fever, chills or sore throat, or other symptoms of a cold or flu. Do not treat yourself. This drug decreases your body's ability to fight infections. Try to avoid being around people who are sick. This medicine may increase your risk to bruise or bleed. Call your doctor or health care  professional if you notice any unusual bleeding. Be careful brushing and flossing your teeth or using a toothpick because you may get an infection or bleed more easily. If you have any dental work done, tell your dentist you are receiving this medicine. Avoid taking products that contain aspirin, acetaminophen, ibuprofen, naproxen, or ketoprofen unless instructed by your doctor. These medicines may hide a fever. Do not become pregnant while taking this medicine. Women should inform their doctor if they wish to become pregnant or think they might be pregnant. There is a potential for serious side effects to an unborn child. Talk to your health care professional or pharmacist for more information. Do not breast-feed an infant while taking this medicine. What side effects may I notice from receiving this medicine? Side effects that you should report to your doctor or health care professional as soon as possible: -allergic reactions like skin rash, itching or hives, swelling of the face, lips, or tongue -signs of infection - fever or chills, cough, sore throat, pain or difficulty passing urine -signs of decreased platelets or bleeding -  bruising, pinpoint red spots on the skin, black, tarry stools, nosebleeds -signs of decreased red blood cells - unusually weak or tired, fainting spells, lightheadedness -breathing problems -changes in hearing -changes in vision -chest pain -high blood pressure -low blood counts - This drug may decrease the number of white blood cells, red blood cells and platelets. You may be at increased risk for infections and bleeding. -nausea and vomiting -pain, swelling, redness or irritation at the injection site -pain, tingling, numbness in the hands or feet -problems with balance, talking, walking -trouble passing urine or change in the amount of urine Side effects that usually do not require medical attention (report to your doctor or health care professional if they  continue or are bothersome): -hair loss -loss of appetite -metallic taste in the mouth or changes in taste This list may not describe all possible side effects. Call your doctor for medical advice about side effects. You may report side effects to FDA at 1-800-FDA-1088. Where should I keep my medicine? This drug is given in a hospital or clinic and will not be stored at home. NOTE: This sheet is a summary. It may not cover all possible information. If you have questions about this medicine, talk to your doctor, pharmacist, or health care provider.  2018 Elsevier/Gold Standard (2007-10-13 14:38:05)

## 2018-02-17 NOTE — Progress Notes (Signed)
START ON PATHWAY REGIMEN - Non-Small Cell Lung     A cycle is every 21 days:     Pembrolizumab      Pemetrexed      Carboplatin   **Always confirm dose/schedule in your pharmacy ordering system**  Patient Characteristics: Stage IV Metastatic, Nonsquamous, Initial Chemotherapy/Immunotherapy, PS = 0, 1, ALK Translocation Negative/Unknown and EGFR Mutation Negative/Non-Sensitizing/Unknown, PD-L1 Expression Positive 1-49% (TPS) / Negative / Not Tested / Awaiting Test Results  and Immunotherapy Candidate AJCC T Category: T3 Current Disease Status: Distant Metastases AJCC N Category: N2 AJCC M Category: M1c AJCC 8 Stage Grouping: IVB Histology: Nonsquamous Cell ROS1 Rearrangement Status: Awaiting Test Results [id number] Mutation Status: Not Applicable - EGFR Mutation Negative/Unknown Other Mutations/Biomarkers: No Other Actionable Mutations NTRK Gene Fusion Status: Awaiting Test Results PD-L1 Expression Status: Awaiting Test Results Chemotherapy/Immunotherapy LOT: Initial Chemotherapy/Immunotherapy Molecular Targeted Therapy: Not Appropriate ALK Translocation Status: Awaiting Test Results EGFR Mutation Status: Awaiting Test Results BRAF [id number] Mutation Status: Awaiting Test Results Performance Status: PS = 0, 1 Immunotherapy Candidate Status: Candidate for Immunotherapy Intent of Therapy: Non-Curative / Palliative Intent, Discussed with Patient

## 2018-02-18 ENCOUNTER — Inpatient Hospital Stay (HOSPITAL_BASED_OUTPATIENT_CLINIC_OR_DEPARTMENT_OTHER): Payer: Commercial Managed Care - PPO | Admitting: Oncology

## 2018-02-18 ENCOUNTER — Encounter: Payer: Self-pay | Admitting: Oncology

## 2018-02-18 ENCOUNTER — Inpatient Hospital Stay: Payer: Commercial Managed Care - PPO

## 2018-02-18 VITALS — BP 127/81 | HR 98 | Temp 96.9°F | Resp 18

## 2018-02-18 DIAGNOSIS — C7931 Secondary malignant neoplasm of brain: Secondary | ICD-10-CM

## 2018-02-18 DIAGNOSIS — E7800 Pure hypercholesterolemia, unspecified: Secondary | ICD-10-CM

## 2018-02-18 DIAGNOSIS — C7951 Secondary malignant neoplasm of bone: Secondary | ICD-10-CM

## 2018-02-18 DIAGNOSIS — I509 Heart failure, unspecified: Secondary | ICD-10-CM

## 2018-02-18 DIAGNOSIS — J449 Chronic obstructive pulmonary disease, unspecified: Secondary | ICD-10-CM

## 2018-02-18 DIAGNOSIS — Z7901 Long term (current) use of anticoagulants: Secondary | ICD-10-CM

## 2018-02-18 DIAGNOSIS — I110 Hypertensive heart disease with heart failure: Secondary | ICD-10-CM

## 2018-02-18 DIAGNOSIS — C3412 Malignant neoplasm of upper lobe, left bronchus or lung: Secondary | ICD-10-CM | POA: Diagnosis not present

## 2018-02-18 DIAGNOSIS — C787 Secondary malignant neoplasm of liver and intrahepatic bile duct: Secondary | ICD-10-CM

## 2018-02-18 DIAGNOSIS — Z86718 Personal history of other venous thrombosis and embolism: Secondary | ICD-10-CM

## 2018-02-18 DIAGNOSIS — E119 Type 2 diabetes mellitus without complications: Secondary | ICD-10-CM

## 2018-02-18 NOTE — Progress Notes (Signed)
Well Chemo Visit  Subjective:  Patient ID: Laura Booth, female    DOB: 1961-04-13  Age: 57 y.o. MRN: [id number]  CC:  Chief Complaint  Patient presents with  . Lung Cancer    HPI Patient was last seen by Dr. Janese Banks on 02/17/2018 to discuss pathology results and further treatment management.  She admitted to feeling fatigued and having a poor appetite.  She had been drinking 3-4 ensures daily.  Reported pain in her right hip rating down her right leg.  We discussed pathology and treatment options.  Recommended combination chemo immunotherapy with carbo and Alimta and Keytruda every 3 weeks for 4 cycles followed by maintenance Alimta/Keytruda if she responds well.  They discussed side effects of medications.  Also discussed Omnicef testing while awaiting biomarkers.  She was started on folic acid 1 mg daily and B12 during her first chemo cycle.  Results from MRI of her brain revealed 4 enhancing intracranial lesions consistent with brain metastasis. They discussed the potential for whole brain radiation after the completion of 4 cycles. They discussed that immunotherapy does have a potential to cross blood-brain barrier and may have a positive effect to these intracranial mets.   Laura Booth presents to [facility] for initial meeting and discussion in preparation of starting chemotherapy. We discussed that the role of the [facility] is to provide additional resources and assistance those who may have an increased risk for complications during the course of chemotherapy. High risk factors may include advanced age, performance status and/or comorbid conditions such as hypertension, DM and CKD.  We discussed the relationship with her primary care physician, available insurance, financial concerns/needs, access to medications and potential transportation issues.  Discussed that the goal of this program is to help prevent unplanned ER visits and to help reduce complications during  chemotherapy. We introduced symptom management clinic and their availability for same day appointment should problems arise here at the cancer center.   Patient recently had chemo education with [facility] and is feeling overwhelmed with information.  She has not picked up any for medications and is scheduled to begin chemotherapy tomorrow.  She plans to go to the pharmacy after our visit today.  She is a good relationship with her primary care physician Dr. Gwynneth Aliment.  She denies any financial concerns at this time.  States her insurance is covering her medications completely.  She should not have a co-pay.  She has reliable transportation.  Her sister is accompanied with her today and will be with her during each visit.   History Patient Active Problem List   Diagnosis Date Noted  . Malignant neoplasm of upper lobe of left lung ([place]) 02/16/2018  . Goals of care, counseling/discussion 02/16/2018  . Brain metastases ([place]) 02/16/2018  . PNA (pneumonia) 09/19/2017   Past Medical History:  Diagnosis Date  . Asthma   . CHF (congestive heart failure) ([place])   . COPD (chronic obstructive pulmonary disease) ([place])   . Diabetes mellitus without complication ([place])   . Hypercholesteremia   . Hypertension   . Reflux esophagitis    Past Surgical History:  Procedure Laterality Date  . IR IMAGING GUIDED PORT INSERTION  02/11/2018   Allergies  Allergen Reactions  . Other Shortness Of Breath    Beta blocker    Prior to Admission medications   Medication Sig Start Date End Date Taking? Authorizing Provider  acetaminophen (TYLENOL) 500 MG tablet Take 1,000 mg by mouth every 6 (six) hours  as needed.   Yes [provider]  ADVAIR DISKUS 500-50 MCG/DOSE AEPB Inhale 1 puff into the lungs every 12 (twelve) hours. 08/25/17  Yes [provider]  atorvastatin (LIPITOR) 40 MG tablet Take 40 mg by mouth daily.    Yes [provider]  fexofenadine (ALLEGRA) 180 MG tablet Take 180 mg by mouth  daily.   Yes [provider]  furosemide (LASIX) 20 MG tablet Take 20 mg by mouth. PRN   Yes [provider]  guaiFENesin-dextromethorphan (ROBITUSSIN DM) 100-10 MG/5ML syrup Take 15 mLs by mouth every 4 (four) hours as needed for cough. 09/22/17  Yes [name], MD  ipratropium-albuterol (DUONEB) 0.5-2.5 (3) MG/3ML SOLN Take 3 mLs by nebulization every 4 (four) hours. 09/22/17  Yes [name], MD  metFORMIN (GLUCOPHAGE-XR) 750 MG 24 hr tablet Take 1 tablet by mouth 2 (two) times daily. 09/06/17  Yes [provider]  montelukast (SINGULAIR) 10 MG tablet Take 10 mg by mouth daily. 09/08/17  Yes [provider]  nicotine (NICODERM CQ - DOSED IN MG/24 HOURS) 21 mg/24hr patch Place 1 patch (21 mg total) onto the skin daily. 09/23/17  Yes [name], MD  omeprazole (PRILOSEC) 20 MG capsule Take 20 mg by mouth daily.   Yes [provider]  oxyCODONE (OXY IR/ROXICODONE) 5 MG immediate release tablet Take 1 tablet (5 mg total) by mouth every 6 (six) hours as needed for severe pain. 01/30/18  Yes Sindy Guadeloupe, MD  PROAIR HFA 108 [id number] Base) MCG/ACT inhaler Inhale 2 puffs into the lungs 4 (four) times daily as needed. 09/22/17  Yes [name], MD  SPIRIVA HANDIHALER 18 MCG inhalation capsule Place 1 puff into inhaler and inhale daily. 08/25/17  Yes [provider]  warfarin (COUMADIN) 6 MG tablet Take 1 tablet (6 mg total) by mouth daily at 6 PM. TAKE 10 MG BY MOUTH ON Monday AND 7.5MG  DAILY ALL OTHER DAYS Patient taking differently: Take 5 mg by mouth daily at 6 PM. TAKE 10 MG BY MOUTH ON Monday AND 7.5MG  DAILY ALL OTHER DAYS 09/22/17  Yes [name], MD  dexamethasone (DECADRON) 4 MG tablet Take 1 tab two times a day the day before Alimta chemo. Take 2 tabs two times a day starting the day after chemo for 3 days. Patient not taking: Reported on 02/18/2018 02/17/18   Sindy Guadeloupe, MD  fentaNYL (DURAGESIC - DOSED  MCG/HR) 12 MCG/HR Place 1 patch (12.5 mcg total) onto the skin every 3 (three) days. Patient not taking: Reported on 02/18/2018 02/17/18   Sindy Guadeloupe, MD  folic acid (FOLVITE) 1 MG tablet Take 1 tablet (1 mg total) by mouth daily. Patient not taking: Reported on 02/18/2018 02/17/18   Sindy Guadeloupe, MD  lidocaine-prilocaine (EMLA) cream Apply to affected area once Patient not taking: Reported on 02/18/2018 02/17/18   Sindy Guadeloupe, MD  LORazepam (ATIVAN) 0.5 MG tablet Take 1 tablet (0.5 mg total) by mouth every 6 (six) hours as needed (Nausea or vomiting). Patient not taking: Reported on 02/18/2018 02/17/18   Sindy Guadeloupe, MD  ondansetron (ZOFRAN) 8 MG tablet Take 1 tablet (8 mg total) by mouth every 8 (eight) hours as needed. Patient not taking: Reported on 02/18/2018 02/17/18   Sindy Guadeloupe, MD  prochlorperazine (COMPAZINE) 10 MG tablet Take 1 tablet (10 mg total) by mouth every 6 (six) hours as needed (Nausea or vomiting). Patient not taking: Reported on 02/18/2018 02/17/18   Sindy Guadeloupe, MD  prochlorperazine (COMPAZINE) 25 MG suppository Place 1 suppository (25 mg total) rectally every 12 (twelve) hours as needed for nausea. Patient not taking: Reported on 02/18/2018 02/17/18   Sindy Guadeloupe, MD   Social History   Socioeconomic History  . Marital status: Single    Spouse name: Not on file  . Number of children: Not on file  . Years of education: Not on file  . Highest education level: Not on file  Occupational History  . Not on file  Social Needs  . Financial resource strain: Not on file  . Food insecurity:    Worry: Not on file    Inability: Not on file  . Transportation needs:    Medical: Not on file    Non-medical: Not on file  Tobacco Use  . Smoking status: Former Smoker    Last attempt to quit: 01/12/2018    Years since quitting: 0.1  . Smokeless tobacco: Never Used  Substance and Sexual Activity  . Alcohol use: Yes    Frequency: Never    Comment: socially  . Drug  use: No  . Sexual activity: Not on file  Lifestyle  . Physical activity:    Days per week: Not on file    Minutes per session: Not on file  . Stress: Not on file  Relationships  . Social connections:    Talks on phone: Not on file    Gets together: Not on file    Attends religious service: Not on file    Active member of club or organization: Not on file    Attends meetings of clubs or organizations: Not on file    Relationship status: Not on file  . Intimate partner violence:    Fear of current or ex partner: Not on file    Emotionally abused: Not on file    Physically abused: Not on file    Forced sexual activity: Not on file  Other Topics Concern  . Not on file  Social History Narrative  . Not on file    Review of Systems  Constitutional: Negative.  Negative for appetite change, chills, fatigue and fever.  HENT: Negative.  Negative for congestion, mouth sores, nosebleeds, sinus pain and sore throat.   Eyes: Negative.   Respiratory: Negative.  Negative for cough, shortness of breath and wheezing.   Cardiovascular: Negative.  Negative for chest pain and leg swelling.  Gastrointestinal: Negative.  Negative for abdominal distention, abdominal pain, blood in stool, constipation, diarrhea, nausea and vomiting.  Endocrine: Negative.   Genitourinary: Negative.  Negative for dysuria, flank pain, frequency, hematuria and urgency.  Musculoskeletal: Negative.  Negative for arthralgias and back pain.  Skin: Negative.  Negative for pallor.  Allergic/Immunologic: Negative.   Neurological: Negative.  Negative for dizziness, weakness and headaches.  Hematological: Negative.  Negative for adenopathy.  Psychiatric/Behavioral: Negative.  Negative for confusion. The patient is not nervous/anxious.     Objective:  BP 127/81 (BP Location: Right Arm, Patient Position: Sitting)   Pulse 98   Temp (!) 96.9 F (36.1 C) (Tympanic)   Resp 18   SpO2 94%   Physical Exam  Constitutional: She is  oriented to person, place, and time. Vital signs are normal. She appears well-developed and well-nourished.  HENT:  Head: Normocephalic and atraumatic.  Eyes: Pupils are equal, round, and reactive to light.  Neck: Normal range of motion.  Cardiovascular: Normal rate, regular rhythm and normal heart sounds.  No murmur heard. Pulmonary/Chest: Effort normal. She has decreased  breath sounds. She has wheezes in the right upper field.  Abdominal: Soft. Normal appearance and bowel sounds are normal. She exhibits no distension. There is no tenderness.  Musculoskeletal: Normal range of motion. She exhibits no edema.  Neurological: She is alert and oriented to person, place, and time.  Skin: Skin is warm and dry. No rash noted.  Psychiatric: Judgment normal.      Assessment & Plan:  Laura Booth is a 57 y.o. female who presents to chemo care for health history and medication review.  1.  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  Awaiting Omnicef testing and biomarker testing.  Had chemo class this morning.  Scheduled for first cycle tomorrow. RTC on 8/13 for labs and md assessment.   2. Chemo Care/High Risk clinic: Discussed role and indication for chemo care clinic. Identified high risk for er/hospitalization based on: recent ER visits, and history of  multiple co-morbid conditions (DM, COPD, CHF and HTN). Also identified additional factors: financial instabilty.Will set up a consultation with Elease Etienne (SW) to discuss services available to assist with finances in future Medications are completely covered.Discussed the mentor program and counseling but she has declined services at this time.  Reviewed Medications: Called Pharmacy regarding recently prescribed medications needed prior to starting chemo. Included  oxycodone 5 mg every 6 hours, Decadron 4 mg, fentanyl 12 mcg/h patch, folic acid 1 mg tablets, EMLA cream and Ativan 0.5 mg, Zofran 8 mg and Compazine 10 mg.  This will cost her $0.  She needs to start her Decadron today so I encouraged her to pick up her medications ASAP.    COPD: Maintained on Advair, guaifenesin, DuoNeb, pro-air and Spiriva.  Type 2 diabetes: Maintained on  metformin 2 mg twice daily.  Hypercholesterolemia: Maintained on Lipitor 40 mg tablets daily.  Seasonal allergies: Maintained on 180 mg Allegra tablet.  Hypertension/CHF: Maintained on 20 mg Lasix daily.  Left ventricular thrombus: Maintained on 6 mg Coumadin.   Encouraged her to call if any problems arise after her first treatment tomorrow for visit to symptom management.  Clinic number provided.  All questions were answered.  Greater than 50% was spent in counseling and coordination of care with this patient including but not limited to discussion of the relevant topics above (See A&P) including, but not limited to diagnosis and management of acute and chronic medical conditions.   Faythe Casa, NP 02/18/2018 11:36 AM

## 2018-02-19 ENCOUNTER — Other Ambulatory Visit: Payer: Self-pay | Admitting: *Deleted

## 2018-02-19 ENCOUNTER — Other Ambulatory Visit: Payer: Commercial Managed Care - PPO

## 2018-02-19 ENCOUNTER — Ambulatory Visit: Payer: Commercial Managed Care - PPO | Admitting: Oncology

## 2018-02-19 ENCOUNTER — Inpatient Hospital Stay: Payer: Commercial Managed Care - PPO

## 2018-02-19 ENCOUNTER — Encounter: Payer: Self-pay | Admitting: *Deleted

## 2018-02-19 VITALS — BP 114/74 | HR 112 | Temp 97.5°F | Resp 18

## 2018-02-19 DIAGNOSIS — J449 Chronic obstructive pulmonary disease, unspecified: Secondary | ICD-10-CM | POA: Insufficient documentation

## 2018-02-19 DIAGNOSIS — C7951 Secondary malignant neoplasm of bone: Secondary | ICD-10-CM

## 2018-02-19 DIAGNOSIS — Z5111 Encounter for antineoplastic chemotherapy: Secondary | ICD-10-CM | POA: Insufficient documentation

## 2018-02-19 DIAGNOSIS — I110 Hypertensive heart disease with heart failure: Secondary | ICD-10-CM

## 2018-02-19 DIAGNOSIS — C787 Secondary malignant neoplasm of liver and intrahepatic bile duct: Secondary | ICD-10-CM

## 2018-02-19 DIAGNOSIS — Z5112 Encounter for antineoplastic immunotherapy: Secondary | ICD-10-CM

## 2018-02-19 DIAGNOSIS — C3412 Malignant neoplasm of upper lobe, left bronchus or lung: Secondary | ICD-10-CM | POA: Insufficient documentation

## 2018-02-19 DIAGNOSIS — C7931 Secondary malignant neoplasm of brain: Secondary | ICD-10-CM

## 2018-02-19 DIAGNOSIS — M549 Dorsalgia, unspecified: Secondary | ICD-10-CM

## 2018-02-19 DIAGNOSIS — I509 Heart failure, unspecified: Secondary | ICD-10-CM | POA: Insufficient documentation

## 2018-02-19 DIAGNOSIS — G893 Neoplasm related pain (acute) (chronic): Secondary | ICD-10-CM

## 2018-02-19 DIAGNOSIS — J9621 Acute and chronic respiratory failure with hypoxia: Secondary | ICD-10-CM

## 2018-02-19 DIAGNOSIS — E119 Type 2 diabetes mellitus without complications: Secondary | ICD-10-CM | POA: Insufficient documentation

## 2018-02-19 DIAGNOSIS — Z79899 Other long term (current) drug therapy: Secondary | ICD-10-CM | POA: Diagnosis not present

## 2018-02-19 DIAGNOSIS — E875 Hyperkalemia: Secondary | ICD-10-CM

## 2018-02-19 MED ORDER — SODIUM CHLORIDE 0.9% FLUSH
10.0000 mL | INTRAVENOUS | Status: DC | PRN
  Filled 2018-02-19: qty 10

## 2018-02-19 MED ORDER — CYANOCOBALAMIN 1000 MCG/ML IJ SOLN
1000.0000 ug | INTRAMUSCULAR | Status: DC
  Administered 2018-02-19: 1000 ug via INTRAMUSCULAR
  Filled 2018-02-19: qty 1

## 2018-02-19 MED ORDER — PALONOSETRON HCL INJECTION 0.25 MG/5ML
0.2500 mg | Freq: Once | INTRAVENOUS | Status: AC
  Administered 2018-02-19: 0.25 mg via INTRAVENOUS
  Filled 2018-02-19: qty 5

## 2018-02-19 MED ORDER — SODIUM CHLORIDE 0.9 % IV SOLN
Freq: Once | INTRAVENOUS | Status: AC
  Administered 2018-02-19: 09:00:00 via INTRAVENOUS
  Filled 2018-02-19: qty 1000

## 2018-02-19 MED ORDER — SODIUM CHLORIDE 0.9 % IV SOLN
Freq: Once | INTRAVENOUS | Status: AC
  Administered 2018-02-19: 10:00:00 via INTRAVENOUS
  Filled 2018-02-19: qty 5

## 2018-02-19 MED ORDER — SODIUM CHLORIDE 0.9 % IV SOLN
1000.0000 mg | Freq: Once | INTRAVENOUS | Status: AC
  Administered 2018-02-19: 1000 mg via INTRAVENOUS
  Filled 2018-02-19: qty 20

## 2018-02-19 MED ORDER — HEPARIN SOD (PORK) LOCK FLUSH 100 UNIT/ML IV SOLN
500.0000 [IU] | Freq: Once | INTRAVENOUS | Status: AC | PRN
  Administered 2018-02-19: 500 [IU]
  Filled 2018-02-19: qty 5

## 2018-02-19 MED ORDER — SODIUM CHLORIDE 0.9 % IV SOLN
595.0000 mg | Freq: Once | INTRAVENOUS | Status: AC
  Administered 2018-02-19: 600 mg via INTRAVENOUS
  Filled 2018-02-19: qty 60

## 2018-02-19 MED ORDER — SODIUM CHLORIDE 0.9 % IV SOLN
200.0000 mg | Freq: Once | INTRAVENOUS | Status: AC
  Administered 2018-02-19: 200 mg via INTRAVENOUS
  Filled 2018-02-19: qty 8

## 2018-02-19 NOTE — Progress Notes (Signed)
HR 112, Sodium 131, edema noted in bilateral lower legs and ankles. Per Pt MD is aware, per Judeen Hammans RN per Dr. Janese Banks okay to proceed with treatment, no Zometa at this time.  Pt tolerated infusion well. Pt stable at discharge.

## 2018-02-19 NOTE — Progress Notes (Signed)
  Oncology Nurse Navigator Documentation  Navigator Location: [facility] (02/19/18 0900)   )Navigator Encounter Type: Treatment (02/19/18 0900)                   Treatment Initiated Date: 02/19/18 (02/19/18 0900)   Treatment Phase: First Chemo Tx (02/19/18 0900) Barriers/Navigation Needs: No barriers at this time (02/19/18 0900)   Interventions: None required (02/19/18 0900)                      Time Spent with Patient: 30 (02/19/18 0900)

## 2018-02-24 ENCOUNTER — Other Ambulatory Visit: Payer: Self-pay | Admitting: *Deleted

## 2018-02-24 MED ORDER — OXYCODONE HCL 5 MG PO TABS: 5.0000 mg | ORAL_TABLET | Freq: Four times a day (QID) | ORAL | 0 refills | Status: DC | PRN

## 2018-02-24 NOTE — Telephone Encounter (Signed)
Requests refill of oxycodone. States is feeling well after treatment.

## 2018-02-27 ENCOUNTER — Other Ambulatory Visit: Payer: Self-pay | Admitting: *Deleted

## 2018-02-27 ENCOUNTER — Other Ambulatory Visit: Payer: Self-pay | Admitting: Oncology

## 2018-02-27 ENCOUNTER — Telehealth: Payer: Self-pay | Admitting: *Deleted

## 2018-02-27 MED ORDER — PREDNISONE 10 MG PO TABS: ORAL_TABLET | ORAL | 0 refills | Status: DC

## 2018-02-27 NOTE — Telephone Encounter (Signed)
Will send script for short course steroids to see if it helps with her breathing

## 2018-02-27 NOTE — Telephone Encounter (Signed)
Called pt and let her know that dr Janese Banks sent her a rx for prednisone- that med should dec. Swelling in her lungs to help her breathe better.  I spoke to her about oxygen for her that was on her message and let her know that she does not qualify for oxygen.  She has to have sat 89% or less and each time they check it is 94%.  She states that she was in someone's office and it was 91 %.  I told her that she should limit activities and do something and rest.  To eat snacks every 2 hours that will be better for her breathing.  She should stay inside with the humidity out side.  She is having trouble sleeping , eating. She is drinking boost and I told her that she should drink 3  Day and eat snacks.  I also told her that prednisone causes heart rate increase, facial flushing, and it can cause trouble sleeping.  I do want her to start the med today to see if it can help. She will get medication and try it.

## 2018-02-27 NOTE — Telephone Encounter (Signed)
Patient is still waiting for home oxygen and is having significant amount of shortness of breath. Unable to complete her ADL's and using her nebulizer every 3 hours. She is concerned because the weekend is here and she doesn't want to end up in the ED. Is there anything else we can give her to help with the shortness of breath? Please advise.      dhs

## 2018-03-03 ENCOUNTER — Inpatient Hospital Stay (HOSPITAL_BASED_OUTPATIENT_CLINIC_OR_DEPARTMENT_OTHER): Payer: Commercial Managed Care - PPO | Admitting: Oncology

## 2018-03-03 ENCOUNTER — Other Ambulatory Visit: Payer: Self-pay | Admitting: *Deleted

## 2018-03-03 ENCOUNTER — Inpatient Hospital Stay: Payer: Commercial Managed Care - PPO | Attending: Oncology

## 2018-03-03 VITALS — BP 139/92 | HR 116 | Temp 97.8°F | Resp 18 | Ht 64.0 in | Wt 164.2 lb

## 2018-03-03 DIAGNOSIS — Z79899 Other long term (current) drug therapy: Secondary | ICD-10-CM | POA: Insufficient documentation

## 2018-03-03 DIAGNOSIS — C3412 Malignant neoplasm of upper lobe, left bronchus or lung: Secondary | ICD-10-CM

## 2018-03-03 DIAGNOSIS — C7931 Secondary malignant neoplasm of brain: Secondary | ICD-10-CM

## 2018-03-03 DIAGNOSIS — C7951 Secondary malignant neoplasm of bone: Secondary | ICD-10-CM

## 2018-03-03 DIAGNOSIS — C787 Secondary malignant neoplasm of liver and intrahepatic bile duct: Secondary | ICD-10-CM | POA: Diagnosis not present

## 2018-03-03 DIAGNOSIS — G893 Neoplasm related pain (acute) (chronic): Secondary | ICD-10-CM

## 2018-03-03 DIAGNOSIS — J9621 Acute and chronic respiratory failure with hypoxia: Secondary | ICD-10-CM

## 2018-03-03 DIAGNOSIS — J449 Chronic obstructive pulmonary disease, unspecified: Secondary | ICD-10-CM

## 2018-03-03 DIAGNOSIS — C3490 Malignant neoplasm of unspecified part of unspecified bronchus or lung: Secondary | ICD-10-CM

## 2018-03-03 LAB — COMPREHENSIVE METABOLIC PANEL
ALBUMIN: 3.5 g/dL (ref 3.5–5.0)
ALT: 29 U/L (ref 0–44)
ANION GAP: 10 (ref 5–15)
AST: 29 U/L (ref 15–41)
Alkaline Phosphatase: 160 U/L — ABNORMAL HIGH (ref 38–126)
BUN: 16 mg/dL (ref 6–20)
CHLORIDE: 103 mmol/L (ref 98–111)
CO2: 27 mmol/L (ref 22–32)
Calcium: 9.9 mg/dL (ref 8.9–10.3)
Creatinine, Ser: 0.62 mg/dL (ref 0.44–1.00)
GFR calc Af Amer: >60 mL/min (ref >60)
GLUCOSE: 147 mg/dL — AB (ref 70–99)
POTASSIUM: 4 mmol/L (ref 3.5–5.1)
Sodium: 140 mmol/L (ref 135–145)
Total Bilirubin: 0.3 mg/dL (ref 0.3–1.2)
Total Protein: 7.5 g/dL (ref 6.5–8.1)

## 2018-03-03 LAB — CBC WITH DIFFERENTIAL/PLATELET
BASOS ABS: 0 10*3/uL (ref 0–0.1)
Basophils Relative: 0 %
EOS PCT: 0 %
Eosinophils Absolute: 0 10*3/uL (ref 0–0.7)
HEMATOCRIT: 35.5 % (ref 35.0–47.0)
Hemoglobin: 11.5 g/dL — ABNORMAL LOW (ref 12.0–16.0)
LYMPHS ABS: 1.5 10*3/uL (ref 1.0–3.6)
Lymphocytes Relative: 19 %
MCH: 28.5 pg (ref 26.0–34.0)
MCHC: 32.3 g/dL (ref 32.0–36.0)
MCV: 88.4 fL (ref 80.0–100.0)
Monocytes Absolute: 0.6 10*3/uL (ref 0.2–0.9)
Monocytes Relative: 8 %
NEUTROS ABS: 5.9 10*3/uL (ref 1.4–6.5)
Neutrophils Relative %: 73 %
PLATELETS: 154 10*3/uL (ref 150–440)
RBC: 4.02 MIL/uL (ref 3.80–5.20)
RDW: 14.1 % (ref 11.5–14.5)
WBC: 8.1 10*3/uL (ref 3.6–11.0)

## 2018-03-03 LAB — TSH: TSH: 0.726 u[IU]/mL (ref 0.350–4.500)

## 2018-03-03 MED ORDER — OXYCODONE HCL ER 10 MG PO T12A: 10.0000 mg | EXTENDED_RELEASE_TABLET | Freq: Two times a day (BID) | ORAL | 0 refills | Status: DC

## 2018-03-03 NOTE — Progress Notes (Signed)
Increase SOB with wheezing today

## 2018-03-03 NOTE — Progress Notes (Signed)
O2 levels checked during clinic are as followed:  88% on room air at rest 85% on room air with exertion  95% on 2L [place] at rest 93% on 2L [place] with exertion

## 2018-03-04 LAB — T4: T4, Total: 9.3 ug/dL (ref 4.5–12.0)

## 2018-03-05 ENCOUNTER — Encounter: Payer: Self-pay | Admitting: Oncology

## 2018-03-05 NOTE — Progress Notes (Signed)
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 She smoked about 1-2 pack's per day for about 15 years. She says that currently she has not smoked for the last 1 week. She had been having ongoing symptoms of shortness of breath and was treated with oral antibiotics.  However her shortness of breath did not improve and this led to a chest x-ray followed by a CT scan in July 2019. CT chest showed a 6 x 6.1 x 4.3 cm left upper lobe mass concerning for malignancy along with left hilar and aortopulmonary window adenopathy. 10 mm nodule in the right middle lobe. Multiple sclerotic densities noted in the thoracic spine may represent metastatic disease.  This was followed by a PET/CT scan which showed 8.9 cm left upper lobe mass with an SUV of 16.9 along with adjacent precarinal and mediastinal adenopathy. Multiple hypermetabolic hepatic foci suspicious for metastatic disease. Right middle lobe pulmonary nodule not significantly hypermetabolic. Multiple osseous metastases within the left femoral neck with an SUV of 5.4 and a sclerotic right anterior acetabular region with an SUV of 9.5.  Patient lives alone and is independent of her ADLs and IADLs. She is not on any home oxygen. She does report  feeling fatigued and has lost about 15 pounds over the last 2 months. She reports poor appetite. Reports back pain which is radiating to her bilateral arms as well as radiating to her right lower extremity. She does not have any children and her only close family is her sister.   Ultrasound-guided liver biopsy showed adenocarcinoma consistent with lung origin. TTF1 positive Omniseq testing is currently pending  MRI brain showed: IMPRESSION: 1. Moderately motion degraded examination. 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 Largest in RIGHT cerebellum measuring 22 mm. 2. 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 Old cerebellar, old LEFT thalamus and old LEFT caudate small infarcts. 3. Mild chronic small vessel ischemic changes.   Interval history-patient reported feeling significantly short of breath after minimal exertion which has been gradually getting worse but particularly bad over the last 5 days.  She was given a tapering course of prednisone which she says helped initially in the first couple of days but her breathing has been back to feeling poorly over the last 2 to 3 days.  She is able to function around the house but gets winded easily even when she walks back from the bathroom.  She is currently on Spiriva as well as albuterol.  She has never seen a pulmonologist in the past.  She reports right chest wall pain but sometimes radiates down her right arm.  She has been using PRN oxycodone but did not feel that the fentanyl patch helped her at all and stopped using it.  She also uses as needed Tylenol to help her with pain  ECOG PS-  2 Pain scale- 4 Opioid associated constipation- no  Review of systems- Review of Systems  Constitutional: Positive for malaise/fatigue. Negative for chills, fever and weight loss.  HENT: Negative for congestion, ear discharge and nosebleeds.   Eyes: Negative for blurred  vision.  Respiratory: Positive for shortness of breath. Negative for cough, hemoptysis, sputum production and wheezing.        Right chest wall pain  Cardiovascular: Negative for chest pain, palpitations, orthopnea and claudication.  Gastrointestinal: Negative for abdominal pain, blood in stool, constipation, diarrhea, heartburn, melena, nausea and vomiting.  Genitourinary: Negative for dysuria, flank pain, frequency, hematuria and urgency.  Musculoskeletal: Negative for back pain, joint pain and myalgias.  Skin: Negative for rash.  Neurological: Negative for dizziness, tingling, focal weakness, seizures, weakness and headaches.  Endo/Heme/Allergies: Does not bruise/bleed easily.  Psychiatric/Behavioral: Negative for depression and suicidal ideas. The patient does not have insomnia.       Allergies  Allergen Reactions  . Other Shortness Of Breath    Beta blocker      Past Medical History:  Diagnosis Date  . Asthma   . CHF (congestive heart failure) ([place])   . COPD (chronic obstructive pulmonary disease) ([place])   . Diabetes mellitus without complication ([place])   . Hypercholesteremia   . Hypertension   . Reflux esophagitis      Past Surgical History:  Procedure Laterality Date  . IR IMAGING GUIDED PORT INSERTION  02/11/2018    Social History   Socioeconomic History  . Marital status: Single    Spouse name: Not on file  . Number of children: Not on file  . Years of education: Not on file  . Highest education level: Not on file  Occupational History  . Not on file  Social Needs  . Financial resource strain: Not on file  . Food insecurity:    Worry: Not on file    Inability: Not on file  . Transportation needs:    Medical: Not on file    Non-medical: Not on file  Tobacco Use  . Smoking status: Former Smoker    Last attempt to quit: 01/12/2018    Years since quitting: 0.1  . Smokeless tobacco: Never Used  Substance and Sexual Activity  . Alcohol use: Yes     Frequency: Never    Comment: socially  . Drug use: No  . Sexual activity: Not on file  Lifestyle  . Physical activity:    Days per week: Not on file    Minutes per session: Not on file  . Stress: Not on file  Relationships  . Social connections:    Talks on phone: Not on file    Gets together: Not on file    Attends religious service: Not on file    Active member of club or organization: Not on file    Attends meetings of clubs or organizations: Not on file    Relationship status: Not on file  . Intimate partner violence:    Fear of current or ex partner: Not on file    Emotionally abused: Not on file    Physically abused: Not on file    Forced sexual activity: Not on file  Other Topics Concern  . Not on file  Social History Narrative  . Not on file    No family history on file.   Current Outpatient Medications:  .  ADVAIR DISKUS 500-50 MCG/DOSE AEPB, Inhale 1 puff into the lungs every 12 (twelve) hours., Disp: , Rfl:  0 .  atorvastatin (LIPITOR) 40 MG tablet, Take 40 mg by mouth daily. , Disp: , Rfl:  .  fexofenadine (ALLEGRA) 180 MG tablet, Take 180 mg by mouth daily., Disp: , Rfl:  .  furosemide (LASIX) 20 MG tablet, Take 20 mg by mouth. PRN, Disp: , Rfl:  .  guaiFENesin-dextromethorphan (ROBITUSSIN DM) 100-10 MG/5ML syrup, Take 15 mLs by mouth every 4 (four) hours as needed for cough., Disp: 118 mL, Rfl: 0 .  ipratropium-albuterol (DUONEB) 0.5-2.5 (3) MG/3ML SOLN, Take 3 mLs by nebulization every 4 (four) hours., Disp: 360 mL, Rfl: 0 .  metFORMIN (GLUCOPHAGE-XR) 750 MG 24 hr tablet, Take 1 tablet by mouth 2 (two) times daily., Disp: , Rfl: 3 .  montelukast (SINGULAIR) 10 MG tablet, Take 10 mg by mouth daily., Disp: , Rfl: 3 .  omeprazole (PRILOSEC) 20 MG capsule, Take 20 mg by mouth daily., Disp: , Rfl:  .  oxyCODONE (OXY IR/ROXICODONE) 5 MG immediate release tablet, Take 1 tablet (5 mg total) by mouth every 6 (six) hours as needed for severe pain., Disp: 60 tablet, Rfl:  0 .  predniSONE (DELTASONE) 10 MG tablet, 60 mg X1 followed by 50 mg X1, 40 mg X1, 30 mg X1, 20 mg X1, 10 mg X1 then stop, Disp: 21 tablet, Rfl: 0 .  SPIRIVA HANDIHALER 18 MCG inhalation capsule, Place 1 puff into inhaler and inhale daily., Disp: , Rfl: 0 .  warfarin (COUMADIN) 6 MG tablet, Take 1 tablet (6 mg total) by mouth daily at 6 PM. TAKE 10 MG BY MOUTH ON Monday AND 7.5MG  DAILY ALL OTHER DAYS (Patient taking differently: Take 5 mg by mouth daily at 6 PM. TAKE 10 MG BY MOUTH ON Monday AND 7.5MG  DAILY ALL OTHER DAYS), Disp: 30 tablet, Rfl: 0 .  acetaminophen (TYLENOL) 500 MG tablet, Take 1,000 mg by mouth every 6 (six) hours as needed., Disp: , Rfl:  .  dexamethasone (DECADRON) 4 MG tablet, Take 1 tab two times a day the day before Alimta chemo. Take 2 tabs two times a day starting the day after chemo for 3 days. (Patient not taking: Reported on 02/18/2018), Disp: 30 tablet, Rfl: 1 .  folic acid (FOLVITE) 1 MG tablet, Take 1 tablet (1 mg total) by mouth daily. (Patient not taking: Reported on 02/18/2018), Disp: 30 tablet, Rfl: 6 .  lidocaine-prilocaine (EMLA) cream, Apply to affected area once (Patient not taking: Reported on 02/18/2018), Disp: 30 g, Rfl: 3 .  LORazepam (ATIVAN) 0.5 MG tablet, Take 1 tablet (0.5 mg total) by mouth every 6 (six) hours as needed (Nausea or vomiting). (Patient not taking: Reported on 02/18/2018), Disp: 30 tablet, Rfl: 0 .  nicotine (NICODERM CQ - DOSED IN MG/24 HOURS) 21 mg/24hr patch, Place 1 patch (21 mg total) onto the skin daily. (Patient not taking: Reported on 03/03/2018), Disp: 28 patch, Rfl: 0 .  ondansetron (ZOFRAN) 8 MG tablet, Take 1 tablet (8 mg total) by mouth every 8 (eight) hours as needed. (Patient not taking: Reported on 02/18/2018), Disp: 30 tablet, Rfl: 1 .  oxyCODONE (OXYCONTIN) 10 mg 12 hr tablet, Take 1 tablet (10 mg total) by mouth every 12 (twelve) hours., Disp: 30 tablet, Rfl: 0 .  PROAIR HFA 108 (90 Base) MCG/ACT inhaler, Inhale 2 puffs into the  lungs 4 (four) times daily as needed. (Patient not taking: Reported on 03/03/2018), Disp: 18 g, Rfl: 1 .  prochlorperazine (COMPAZINE) 10 MG tablet, Take 1 tablet (10 mg total) by mouth every 6 (six)  hours as needed (Nausea or vomiting). (Patient not taking: Reported on 02/18/2018), Disp: 30 tablet, Rfl: 1 .  prochlorperazine (COMPAZINE) 25 MG suppository, Place 1 suppository (25 mg total) rectally every 12 (twelve) hours as needed for nausea. (Patient not taking: Reported on 02/18/2018), Disp: 12 suppository, Rfl: 3  Physical exam:  Vitals:   03/03/18 1512 03/03/18 1513  BP:  (!) 139/92  Pulse:  (!) 116  Resp:  18  Temp:  97.8 F (36.6 C)  SpO2:  (!) 88%  Weight: 164 lb 3.2 oz (74.5 kg) 164 lb 3.2 oz (74.5 kg)  Height:  5\' 4"  (1.626 m)   Physical Exam  Constitutional: She is oriented to person, place, and time.  Thin frail woman sitting in a wheelchair.  Appears in no acute distress.  She is mildly short of breath at rest  HENT:  Head: Normocephalic and atraumatic.  Eyes: Pupils are equal, round, and reactive to light. EOM are normal.  Neck: Normal range of motion.  Cardiovascular: Regular rhythm and normal heart sounds.  Tachycardic  Pulmonary/Chest: Effort normal and breath sounds normal.  Abdominal: Soft. Bowel sounds are normal.  Neurological: She is alert and oriented to person, place, and time.  Skin: Skin is warm and dry.     CMP Latest Ref Rng & Units 03/03/2018  Glucose 70 - 99 mg/dL 147(H)  BUN 6 - 20 mg/dL 16  Creatinine 0.44 - 1.00 mg/dL 0.62  Sodium 135 - 145 mmol/L 140  Potassium 3.5 - 5.1 mmol/L 4.0  Chloride 98 - 111 mmol/L 103  CO2 22 - 32 mmol/L 27  Calcium 8.9 - 10.3 mg/dL 9.9  Total Protein 6.5 - 8.1 g/dL 7.5  Total Bilirubin 0.3 - 1.2 mg/dL 0.3  Alkaline Phos 38 - 126 U/L 160(H)  AST 15 - 41 U/L 29  ALT 0 - 44 U/L 29   CBC Latest Ref Rng & Units 03/03/2018  WBC 3.6 - 11.0 K/uL 8.1  Hemoglobin 12.0 - 16.0 g/dL 11.5(L)  Hematocrit 35.0 - 47.0 % 35.5    Platelets 150 - 440 K/uL 154    No images are attached to the encounter.  Mr Jeri Cos Wo Contrast  Result Date: 02/14/2018 CLINICAL DATA:  Metastatic lung cancer. History of hypertension, hypercholesterolemia. EXAM: MRI HEAD WITHOUT AND WITH CONTRAST TECHNIQUE: Multiplanar, multiecho pulse sequences of the brain and surrounding structures were obtained without and with intravenous contrast. CONTRAST:  23mL MULTIHANCE GADOBENATE DIMEGLUMINE 529 MG/ML IV SOLN COMPARISON:  PET-CT January 28, 2018 and CT HEAD October 23, 2014. FINDINGS: Moderately motion degraded examination. INTRACRANIAL CONTENTS: 4 enhancing lesions with proportional FLAIR T2 hyperintense vasogenic edema as follows: RIGHT cerebellum 17 x 22 mm (series 13, image 54), RIGHT cerebellum 6 x 9 mm (series 13, image 59), Pituitary infundibulum 6 x 8 mm (series 13, image 69), LEFT temporal lobe 9 x 10 mm (series 13, image 58). No midline shift or significant mass effect. LEFT frontal, biparietal lobe encephalomalacia. Old small bilateral cerebellar infarcts. 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 Patchy supratentorial white matter FLAIR T2 hyperintensities. No abnormal extra-axial fluid collections. VASCULAR: Normal major intracranial vascular flow voids present at skull base. SKULL AND UPPER CERVICAL SPINE: No abnormal sellar expansion. No suspicious calvarial bone marrow signal. Craniocervical junction maintained. SINUSES/ORBITS: RIGHT sphenoid sinus mucosal retention cyst. No paranasal sinus air-fluid levels. Mastoid air cells are well aerated.The included ocular globes and orbital contents are non-suspicious. OTHER: None. IMPRESSION: 1. Moderately motion degraded examination. Four intracranial metastasis including  pituitary infundibulum. Largest in RIGHT cerebellum measuring 22 mm. 2. 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 Old cerebellar, old LEFT thalamus and old LEFT caudate small  infarcts. 3. Mild chronic small vessel ischemic changes. 4. These results will be called to the ordering clinician or representative by the Radiologist Assistant, and communication documented in the PACS or zVision Dashboard. Electronically Signed   By: Elon Alas M.D.   On: 02/14/2018 00:11   US Biopsy (liver)  Result Date: 02/11/2018 INDICATION: Lung mass.  Liver lesions.  Bone lesions. EXAM: ULTRASOUND-GUIDED CORE BIOPSY OF A LIVER LESION MEDICATIONS: None. ANESTHESIA/SEDATION: Fentanyl 75 mcg IV; Versed 1.5 mg IV Moderate Sedation Time:  12 minutes The patient was continuously monitored during the procedure by the interventional radiology nurse under my direct supervision. FLUOROSCOPY TIME:  Fluoroscopy Time:  minutes  seconds ( mGy). COMPLICATIONS: None immediate. PROCEDURE: Informed written consent was obtained from the patient after a thorough discussion of the procedural risks, benefits and alternatives. All questions were addressed. Maximal Sterile Barrier Technique was utilized including caps, mask, sterile gowns, sterile gloves, sterile drape, hand hygiene and skin antiseptic. A timeout was performed prior to the initiation of the procedure. The right upper quadrant was prepped with ChloraPrep in a sterile fashion, and a sterile drape was applied covering the operative field. A sterile gown and sterile gloves were used for the procedure. Under sonographic guidance, an 17 gauge guide needle was advanced into the liver lesion adjacent to the gallbladder. Subsequently 2 18 gauge core biopsies were obtained. Gel-Foam slurry was injected into the needle tract. The guide needle was removed. Final imaging was performed. Patient tolerated the procedure well without complication. Vital sign monitoring by nursing staff during the procedure will continue as patient is in the special procedures unit for post procedure observation. FINDINGS: The images document guide needle placement within the liver  lesion. Post biopsy images demonstrate no hemorrhage. IMPRESSION: Successful ultrasound-guided core biopsy of a liver lesion adjacent to the gallbladder. Electronically Signed   By: Marybelle Killings M.D.   On: 02/11/2018 12:43   Ir Imaging Guided Port Insertion  Result Date: 02/11/2018 CLINICAL DATA:  Lung mass EXAM: TUNNEL POWER PORT PLACEMENT WITH SUBCUTANEOUS POCKET UTILIZING ULTRASOUND & FLOUROSCOPY FLUOROSCOPY TIME:  24 seconds.  Four mGy. MEDICATIONS AND MEDICAL HISTORY: Versed 2 mg, Fentanyl 75 mcg. Additional Medications: Ancef 2 g. Antibiotics were given within 2 hours of the procedure. ANESTHESIA/SEDATION: Moderate sedation time: 34 minutes. Nursing monitored the the patient during the procedure. PROCEDURE: After written informed consent was obtained, patient was placed in the supine position on angiographic table. The right neck and chest was prepped and draped in a sterile fashion. Lidocaine was utilized for local anesthesia. The right jugular vein was noted to be patent initially with ultrasound. Under sonographic guidance, a micropuncture needle was inserted into the right IJ vein (Ultrasound and fluoroscopic image documentation was performed). The needle was removed over an 018 wire which was exchanged for a Amplatz. This was advanced into the IVC. An 8-French dilator was advanced over the Amplatz. A small incision was made in the right upper chest over the anterior right second rib. Utilizing blunt dissection, a subcutaneous pocket was created in the caudal direction. The pocket was irrigated with a copious amount of sterile normal saline. The port catheter was tunneled from the chest incision, and out the neck incision. The reservoir was inserted into the subcutaneous pocket and secured with two 3-0 Ethilon stitches. A  peel-away sheath was advanced over the Amplatz wire. The port catheter was cut to measure length and inserted through the peel-away sheath. The peel-away sheath was removed. The chest  incision was closed with 3-0 Vicryl interrupted stitches for the subcutaneous tissue and a running of 4-0 Vicryl subcuticular stitch for the skin. The neck incision was closed with a 4-0 Vicryl subcuticular stitch. Derma-bond was applied to both surgical incisions. The port reservoir was flushed and instilled with heparinized saline. No complications. FINDINGS: A right IJ vein Port-A-Cath is in place with its tip at the cavoatrial junction. COMPLICATIONS: None IMPRESSION: Successful 8 French right internal jugular vein power port placement with its tip at the SVC/RA junction. Electronically Signed   By: Marybelle Killings M.D.   On: 02/11/2018 14:21     Assessment and plan- Patient is a 57 y.o. female with adenocarcinoma of the lung stage IV CT3N2M1B with metastases to the liver brain and bone status post 1 cycle of carboplatin Alimta and Keytruda  Overall she tolerated the first cycle of chemo immunotherapy well without any significant side effects such as nausea or vomiting.  Her counts are stable today.  Overall her shortness of breath has been slowly getting worse and I feel it is partly due to her COPD and partly from her lung cancer.  I am hoping this improves after a few more cycles of chemo immunotherapy.  Based on her oxygen saturations at rest and exertion she does qualify for home oxygen and we will set up the same for her.  I do feel that in the long run it would be in the best interest for patient to follow-up with pulmonary given that her COPD is also contributing to a great extent for her chronic hypoxic respiratory failure.  However patient would like to hold off on seeing pulmonary at this time.  I have encouraged her to use her albuterol and Spiriva at this time.  She does get tremors and anxiety with frequent use of albuterol and I have asked her not to use it for greater than every 4 hours.  Neoplasm related pain: She will continue PRN oxycodone.  She stopped using her fentanyl patch as she  felt that it was not helping her.  I will switch her to OxyContin 10 mg p.o. twice daily for better control with her pain.  I will see her back tentatively in 10 days time for the next cycle of carboplatin Alimta and Keytruda.  We will plan to repeat scans after 2 cycles.  omniseq testing is still pending   Visit Diagnosis 1. Malignant neoplasm of lung, unspecified laterality, unspecified part of lung ([place])   2. Acute on chronic respiratory failure with hypoxia ([facility])   3. Neoplasm related pain      Dr. Randa Evens, MD, MPH [facility] [phone] 03/05/2018 9:22 AM

## 2018-03-12 ENCOUNTER — Inpatient Hospital Stay (HOSPITAL_BASED_OUTPATIENT_CLINIC_OR_DEPARTMENT_OTHER): Payer: Commercial Managed Care - PPO | Admitting: Oncology

## 2018-03-12 ENCOUNTER — Telehealth: Payer: Self-pay | Admitting: *Deleted

## 2018-03-12 ENCOUNTER — Inpatient Hospital Stay: Payer: Commercial Managed Care - PPO

## 2018-03-12 ENCOUNTER — Encounter: Payer: Self-pay | Admitting: Oncology

## 2018-03-12 VITALS — BP 133/85 | HR 112 | Temp 97.5°F | Resp 18 | Ht 64.0 in | Wt 170.6 lb

## 2018-03-12 DIAGNOSIS — Z5111 Encounter for antineoplastic chemotherapy: Secondary | ICD-10-CM

## 2018-03-12 DIAGNOSIS — J449 Chronic obstructive pulmonary disease, unspecified: Secondary | ICD-10-CM

## 2018-03-12 DIAGNOSIS — I509 Heart failure, unspecified: Secondary | ICD-10-CM

## 2018-03-12 DIAGNOSIS — I110 Hypertensive heart disease with heart failure: Secondary | ICD-10-CM

## 2018-03-12 DIAGNOSIS — Z5112 Encounter for antineoplastic immunotherapy: Secondary | ICD-10-CM

## 2018-03-12 DIAGNOSIS — E875 Hyperkalemia: Secondary | ICD-10-CM

## 2018-03-12 DIAGNOSIS — E119 Type 2 diabetes mellitus without complications: Secondary | ICD-10-CM

## 2018-03-12 DIAGNOSIS — C7931 Secondary malignant neoplasm of brain: Secondary | ICD-10-CM | POA: Diagnosis not present

## 2018-03-12 DIAGNOSIS — C3412 Malignant neoplasm of upper lobe, left bronchus or lung: Secondary | ICD-10-CM | POA: Diagnosis not present

## 2018-03-12 DIAGNOSIS — C787 Secondary malignant neoplasm of liver and intrahepatic bile duct: Secondary | ICD-10-CM

## 2018-03-12 DIAGNOSIS — J9621 Acute and chronic respiratory failure with hypoxia: Secondary | ICD-10-CM

## 2018-03-12 DIAGNOSIS — C7951 Secondary malignant neoplasm of bone: Secondary | ICD-10-CM | POA: Diagnosis not present

## 2018-03-12 DIAGNOSIS — G893 Neoplasm related pain (acute) (chronic): Secondary | ICD-10-CM

## 2018-03-12 DIAGNOSIS — M549 Dorsalgia, unspecified: Secondary | ICD-10-CM

## 2018-03-12 LAB — CBC WITH DIFFERENTIAL/PLATELET
Basophils Absolute: 0 10*3/uL (ref 0–0.1)
Basophils Relative: 0 %
EOS ABS: 0 10*3/uL (ref 0–0.7)
Eosinophils Relative: 0 %
HEMATOCRIT: 34 % — AB (ref 35.0–47.0)
HEMOGLOBIN: 11.3 g/dL — AB (ref 12.0–16.0)
LYMPHS ABS: 0.7 10*3/uL — AB (ref 1.0–3.6)
Lymphocytes Relative: 12 %
MCH: 29.4 pg (ref 26.0–34.0)
MCHC: 33.4 g/dL (ref 32.0–36.0)
MCV: 88.1 fL (ref 80.0–100.0)
Monocytes Absolute: 0.1 10*3/uL — ABNORMAL LOW (ref 0.2–0.9)
Monocytes Relative: 2 %
NEUTROS ABS: 4.9 10*3/uL (ref 1.4–6.5)
NEUTROS PCT: 86 %
Platelets: 457 10*3/uL — ABNORMAL HIGH (ref 150–440)
RBC: 3.85 MIL/uL (ref 3.80–5.20)
RDW: 15.3 % — ABNORMAL HIGH (ref 11.5–14.5)
WBC: 5.8 10*3/uL (ref 3.6–11.0)

## 2018-03-12 LAB — COMPREHENSIVE METABOLIC PANEL
ALT: 22 U/L (ref 0–44)
AST: 28 U/L (ref 15–41)
Albumin: 3.5 g/dL (ref 3.5–5.0)
Alkaline Phosphatase: 175 U/L — ABNORMAL HIGH (ref 38–126)
Anion gap: 13 (ref 5–15)
BUN: 21 mg/dL — ABNORMAL HIGH (ref 6–20)
CO2: 26 mmol/L (ref 22–32)
CREATININE: 0.72 mg/dL (ref 0.44–1.00)
Calcium: 9.6 mg/dL (ref 8.9–10.3)
Chloride: 95 mmol/L — ABNORMAL LOW (ref 98–111)
GFR calc non Af Amer: >60 mL/min (ref >60)
GLUCOSE: 146 mg/dL — AB (ref 70–99)
Potassium: 5.4 mmol/L — ABNORMAL HIGH (ref 3.5–5.1)
Sodium: 134 mmol/L — ABNORMAL LOW (ref 135–145)
Total Bilirubin: 0.2 mg/dL — ABNORMAL LOW (ref 0.3–1.2)
Total Protein: 7.9 g/dL (ref 6.5–8.1)

## 2018-03-12 MED ORDER — SODIUM CHLORIDE 0.9 % IV SOLN
Freq: Once | INTRAVENOUS | Status: AC
  Administered 2018-03-12: 09:00:00 via INTRAVENOUS
  Filled 2018-03-12: qty 250

## 2018-03-12 MED ORDER — SODIUM CHLORIDE 0.9 % IV SOLN
Freq: Once | INTRAVENOUS | Status: AC
  Administered 2018-03-12: 10:00:00 via INTRAVENOUS
  Filled 2018-03-12: qty 5

## 2018-03-12 MED ORDER — PALONOSETRON HCL INJECTION 0.25 MG/5ML
0.2500 mg | Freq: Once | INTRAVENOUS | Status: AC
  Administered 2018-03-12: 0.25 mg via INTRAVENOUS
  Filled 2018-03-12: qty 5

## 2018-03-12 MED ORDER — SODIUM POLYSTYRENE SULFONATE 15 GM/60ML PO SUSP: 30.0000 g | Freq: Every day | ORAL | 0 refills | Status: AC

## 2018-03-12 MED ORDER — SODIUM CHLORIDE 0.9 % IV SOLN
200.0000 mg | Freq: Once | INTRAVENOUS | Status: AC
  Administered 2018-03-12: 200 mg via INTRAVENOUS
  Filled 2018-03-12: qty 8

## 2018-03-12 MED ORDER — CYANOCOBALAMIN 1000 MCG/ML IJ SOLN
1000.0000 ug | INTRAMUSCULAR | Status: DC
  Administered 2018-03-12: 1000 ug via INTRAMUSCULAR
  Filled 2018-03-12: qty 1

## 2018-03-12 MED ORDER — OXYCODONE HCL 5 MG PO TABS: 5.0000 mg | ORAL_TABLET | Freq: Four times a day (QID) | ORAL | 0 refills | Status: DC | PRN

## 2018-03-12 MED ORDER — SODIUM CHLORIDE 0.9% FLUSH
10.0000 mL | Freq: Once | INTRAVENOUS | Status: AC
  Administered 2018-03-12: 10 mL via INTRAVENOUS
  Filled 2018-03-12: qty 10

## 2018-03-12 MED ORDER — HEPARIN SOD (PORK) LOCK FLUSH 100 UNIT/ML IV SOLN
500.0000 [IU] | Freq: Once | INTRAVENOUS | Status: AC
  Administered 2018-03-12: 500 [IU] via INTRAVENOUS
  Filled 2018-03-12: qty 5

## 2018-03-12 MED ORDER — SODIUM CHLORIDE 0.9 % IV SOLN
1000.0000 mg | Freq: Once | INTRAVENOUS | Status: AC
  Administered 2018-03-12: 1000 mg via INTRAVENOUS
  Filled 2018-03-12: qty 20

## 2018-03-12 MED ORDER — SODIUM CHLORIDE 0.9 % IV SOLN
600.0000 mg | Freq: Once | INTRAVENOUS | Status: AC
  Administered 2018-03-12: 600 mg via INTRAVENOUS
  Filled 2018-03-12: qty 60

## 2018-03-12 NOTE — Telephone Encounter (Signed)
Needs refill of oxycodone IR. Refill sent to Dr, Janese Banks

## 2018-03-13 NOTE — Progress Notes (Signed)
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 She smoked about 1-2 pack's per day for about 15 years. She says that currently she has not smoked for the last 1 week. She had been having ongoing symptoms of shortness of breath and was treated with oral antibiotics.  However her shortness of breath did not improve and this led to a chest x-ray followed by a CT scan in July 2019. CT chest showed a 6 x 6.1 x 4.3 cm left upper lobe mass concerning for malignancy along with left hilar and aortopulmonary window adenopathy. 10 mm nodule in the right middle lobe. Multiple sclerotic densities noted in the thoracic spine may represent metastatic disease.  This was followed by a PET/CT scan which showed 8.9 cm left upper lobe mass with an SUV of 16.9 along with adjacent precarinal and mediastinal adenopathy. Multiple hypermetabolic hepatic foci suspicious for metastatic disease. Right middle lobe pulmonary nodule not significantly hypermetabolic. Multiple osseous metastases within the left femoral neck with an SUV of 5.4 and a sclerotic right anterior acetabular region with an SUV of 9.5.  Patient lives alone and is independent of her ADLs and IADLs. She is not on any home oxygen. She  does report feeling fatigued and has lost about 15 pounds over the last 2 months. She reports poor appetite. Reports back pain which is radiating to her bilateral arms as well as radiating to her right lower extremity. She does not have any children and her only close family is her sister.  Ultrasound-guided liver biopsy showed adenocarcinoma consistent with lung origin.TTF1 positiveOmniseqtesting showed KRAS mutation TMB high. PDL1 IHC 0%  MRI brain showed:IMPRESSION: 1. Moderately motion degraded examination. 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 Largest in RIGHT cerebellum measuring 22 mm. 2. 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 Old cerebellar, old LEFT thalamus and old LEFT caudate small infarcts. 3. Mild chronic small vessel ischemic changes.   Interval history-reports that her shortness of breath has improved after she started using her oxygen.  However she mainly uses that at home and at night but finds it difficult to carry when she has to come for her office visits.  She is also using her Spiriva and albuterol.  Her pain is better controlled after starting OxyContin.  Denies any constipation.  She still has problems with her right hip and uses a cane to ambulate and walks with a slight limp.  Has chronic fatigue.  Denies other complaints  ECOG PS- 2 Pain scale- 4 Opioid associated constipation- no  Review of systems- Review of Systems  Constitutional: Positive for malaise/fatigue. Negative for chills, fever and weight loss.  HENT: Negative for congestion, ear discharge and nosebleeds.   Eyes: Negative for blurred vision.  Respiratory: Negative for cough, hemoptysis, sputum production, shortness of breath and wheezing.  Cardiovascular: Negative for chest pain, palpitations, orthopnea and claudication.  Gastrointestinal: Negative for abdominal pain, blood in stool, constipation, diarrhea,  heartburn, melena, nausea and vomiting.  Genitourinary: Negative for dysuria, flank pain, frequency, hematuria and urgency.  Musculoskeletal: Positive for joint pain. Negative for back pain and myalgias.  Skin: Negative for rash.  Neurological: Negative for dizziness, tingling, focal weakness, seizures, weakness and headaches.  Endo/Heme/Allergies: Does not bruise/bleed easily.  Psychiatric/Behavioral: Negative for depression and suicidal ideas. The patient does not have insomnia.       Allergies  Allergen Reactions  . Other Shortness Of Breath    Beta blocker      Past Medical History:  Diagnosis Date  . Asthma   . CHF (congestive heart failure) ([place])   . COPD (chronic obstructive pulmonary disease) ([place])   . Diabetes mellitus without complication ([place])   . Hypercholesteremia   . Hypertension   . Reflux esophagitis      Past Surgical History:  Procedure Laterality Date  . IR IMAGING GUIDED PORT INSERTION  02/11/2018    Social History   Socioeconomic History  . Marital status: Single    Spouse name: Not on file  . Number of children: Not on file  . Years of education: Not on file  . Highest education level: Not on file  Occupational History  . Not on file  Social Needs  . Financial resource strain: Not on file  . Food insecurity:    Worry: Not on file    Inability: Not on file  . Transportation needs:    Medical: Not on file    Non-medical: Not on file  Tobacco Use  . Smoking status: Former Smoker    Last attempt to quit: 01/12/2018    Years since quitting: 0.1  . Smokeless tobacco: Never Used  Substance and Sexual Activity  . Alcohol use: Yes    Frequency: Never    Comment: socially  . Drug use: No  . Sexual activity: Not on file  Lifestyle  . Physical activity:    Days per week: Not on file    Minutes per session: Not on file  . Stress: Not on file  Relationships  . Social connections:    Talks on phone: Not on file    Gets together: Not on file      Attends religious service: Not on file    Active member of club or organization: Not on file    Attends meetings of clubs or organizations: Not on file    Relationship status: Not on file  . Intimate partner violence:    Fear of current or ex partner: Not on file    Emotionally abused: Not on file    Physically abused: Not on file    Forced sexual activity: Not on file  Other Topics Concern  . Not on file  Social History Narrative  . Not on file    History reviewed. No pertinent family history.   Current Outpatient Medications:  .  ADVAIR DISKUS 500-50 MCG/DOSE AEPB, Inhale 1 puff into the lungs every 12 (twelve) hours., Disp: , Rfl: 0 .  atorvastatin (LIPITOR) 40 MG tablet, Take 40 mg by mouth daily. , Disp: , Rfl:  .  fexofenadine (ALLEGRA) 180 MG tablet, Take 180 mg by mouth daily., Disp: , Rfl:  .  furosemide (LASIX) 20 MG tablet, Take 20 mg by mouth. PRN, Disp: , Rfl:  .  guaiFENesin-dextromethorphan (ROBITUSSIN DM) 100-10 MG/5ML syrup, Take 15 mLs by mouth  every 4 (four) hours as needed for cough., Disp: 118 mL, Rfl: 0 .  ipratropium-albuterol (DUONEB) 0.5-2.5 (3) MG/3ML SOLN, Take 3 mLs by nebulization every 4 (four) hours., Disp: 360 mL, Rfl: 0 .  metFORMIN (GLUCOPHAGE-XR) 750 MG 24 hr tablet, Take 1 tablet by mouth 2 (two) times daily., Disp: , Rfl: 3 .  montelukast (SINGULAIR) 10 MG tablet, Take 10 mg by mouth daily., Disp: , Rfl: 3 .  omeprazole (PRILOSEC) 20 MG capsule, Take 20 mg by mouth daily., Disp: , Rfl:  .  oxyCODONE (OXYCONTIN) 10 mg 12 hr tablet, Take 1 tablet (10 mg total) by mouth every 12 (twelve) hours., Disp: 30 tablet, Rfl: 0 .  predniSONE (DELTASONE) 10 MG tablet, 60 mg X1 followed by 50 mg X1, 40 mg X1, 30 mg X1, 20 mg X1, 10 mg X1 then stop, Disp: 21 tablet, Rfl: 0 .  SPIRIVA HANDIHALER 18 MCG inhalation capsule, Place 1 puff into inhaler and inhale daily., Disp: , Rfl: 0 .  warfarin (COUMADIN) 6 MG tablet, Take 1 tablet (6 mg total) by mouth daily at  6 PM. TAKE 10 MG BY MOUTH ON Monday AND 7.5MG DAILY ALL OTHER DAYS (Patient taking differently: Take 5 mg by mouth daily at 6 PM. TAKE 10 MG BY MOUTH ON Monday AND 7.5MG DAILY ALL OTHER DAYS), Disp: 30 tablet, Rfl: 0 .  acetaminophen (TYLENOL) 500 MG tablet, Take 1,000 mg by mouth every 6 (six) hours as needed., Disp: , Rfl:  .  dexamethasone (DECADRON) 4 MG tablet, Take 1 tab two times a day the day before Alimta chemo. Take 2 tabs two times a day starting the day after chemo for 3 days. (Patient not taking: Reported on 02/18/2018), Disp: 30 tablet, Rfl: 1 .  folic acid (FOLVITE) 1 MG tablet, Take 1 tablet (1 mg total) by mouth daily. (Patient not taking: Reported on 02/18/2018), Disp: 30 tablet, Rfl: 6 .  lidocaine-prilocaine (EMLA) cream, Apply to affected area once (Patient not taking: Reported on 02/18/2018), Disp: 30 g, Rfl: 3 .  LORazepam (ATIVAN) 0.5 MG tablet, Take 1 tablet (0.5 mg total) by mouth every 6 (six) hours as needed (Nausea or vomiting). (Patient not taking: Reported on 02/18/2018), Disp: 30 tablet, Rfl: 0 .  nicotine (NICODERM CQ - DOSED IN MG/24 HOURS) 21 mg/24hr patch, Place 1 patch (21 mg total) onto the skin daily. (Patient not taking: Reported on 03/03/2018), Disp: 28 patch, Rfl: 0 .  ondansetron (ZOFRAN) 8 MG tablet, Take 1 tablet (8 mg total) by mouth every 8 (eight) hours as needed. (Patient not taking: Reported on 02/18/2018), Disp: 30 tablet, Rfl: 1 .  oxyCODONE (OXY IR/ROXICODONE) 5 MG immediate release tablet, Take 1 tablet (5 mg total) by mouth every 6 (six) hours as needed for severe pain., Disp: 60 tablet, Rfl: 0 .  PROAIR HFA 108 (90 Base) MCG/ACT inhaler, Inhale 2 puffs into the lungs 4 (four) times daily as needed. (Patient not taking: Reported on 03/03/2018), Disp: 18 g, Rfl: 1 .  prochlorperazine (COMPAZINE) 10 MG tablet, Take 1 tablet (10 mg total) by mouth every 6 (six) hours as needed (Nausea or vomiting). (Patient not taking: Reported on 02/18/2018), Disp: 30 tablet,  Rfl: 1 .  prochlorperazine (COMPAZINE) 25 MG suppository, Place 1 suppository (25 mg total) rectally every 12 (twelve) hours as needed for nausea. (Patient not taking: Reported on 02/18/2018), Disp: 12 suppository, Rfl: 3 .  sodium polystyrene (KAYEXALATE) 15 GM/60ML suspension, Take 120 mLs (30 g total) by mouth  daily for 4 days., Disp: 500 mL, Rfl: 0  Physical exam:  Vitals:   03/12/18 0841  BP: 133/85  Pulse: (!) 112  Resp: 18  Temp: (!) 97.5 F (36.4 C)  TempSrc: Tympanic  SpO2: 92%  Weight: 170 lb 9.6 oz (77.4 kg)  Height: 5' 4"  (1.626 m)   Physical Exam  Constitutional: She is oriented to person, place, and time.  Thin frail and appears mildly fatigued  HENT:  Head: Normocephalic and atraumatic.  Eyes: Pupils are equal, round, and reactive to light. EOM are normal.  Neck: Normal range of motion.  Cardiovascular: Normal rate, regular rhythm and normal heart sounds.  Pulmonary/Chest: Effort normal and breath sounds normal.  Abdominal: Soft. Bowel sounds are normal.  Musculoskeletal: She exhibits edema (b/l +1).  Neurological: She is alert and oriented to person, place, and time.  Skin: Skin is warm and dry.     CMP Latest Ref Rng & Units 03/12/2018  Glucose 70 - 99 mg/dL 146(H)  BUN 6 - 20 mg/dL 21(H)  Creatinine 0.44 - 1.00 mg/dL 0.72  Sodium 135 - 145 mmol/L 134(L)  Potassium 3.5 - 5.1 mmol/L 5.4(H)  Chloride 98 - 111 mmol/L 95(L)  CO2 22 - 32 mmol/L 26  Calcium 8.9 - 10.3 mg/dL 9.6  Total Protein 6.5 - 8.1 g/dL 7.9  Total Bilirubin 0.3 - 1.2 mg/dL 0.2(L)  Alkaline Phos 38 - 126 U/L 175(H)  AST 15 - 41 U/L 28  ALT 0 - 44 U/L 22   CBC Latest Ref Rng & Units 03/12/2018  WBC 3.6 - 11.0 K/uL 5.8  Hemoglobin 12.0 - 16.0 g/dL 11.3(L)  Hematocrit 35.0 - 47.0 % 34.0(L)  Platelets 150 - 440 K/uL 457(H)    No images are attached to the encounter.  Mr Jeri Cos Wo Contrast  Result Date: 02/14/2018 CLINICAL DATA:  Metastatic lung cancer. History of hypertension,  hypercholesterolemia. EXAM: MRI HEAD WITHOUT AND WITH CONTRAST TECHNIQUE: Multiplanar, multiecho pulse sequences of the brain and surrounding structures were obtained without and with intravenous contrast. CONTRAST:  50m MULTIHANCE GADOBENATE DIMEGLUMINE 529 MG/ML IV SOLN COMPARISON:  PET-CT January 28, 2018 and CT HEAD October 23, 2014. FINDINGS: Moderately motion degraded examination. INTRACRANIAL CONTENTS: 4 enhancing lesions with proportional FLAIR T2 hyperintense vasogenic edema as follows: RIGHT cerebellum 17 x 22 mm (series 13, image 54), RIGHT cerebellum 6 x 9 mm (series 13, image 59), Pituitary infundibulum 6 x 8 mm (series 13, image 69), LEFT temporal lobe 9 x 10 mm (series 13, image 58). No midline shift or significant mass effect. LEFT frontal, biparietal lobe encephalomalacia. Old small bilateral cerebellar infarcts. 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 Patchy supratentorial white matter FLAIR T2 hyperintensities. No abnormal extra-axial fluid collections. VASCULAR: Normal major intracranial vascular flow voids present at skull base. SKULL AND UPPER CERVICAL SPINE: No abnormal sellar expansion. No suspicious calvarial bone marrow signal. Craniocervical junction maintained. SINUSES/ORBITS: RIGHT sphenoid sinus mucosal retention cyst. No paranasal sinus air-fluid levels. Mastoid air cells are well aerated.The included ocular globes and orbital contents are non-suspicious. OTHER: None. IMPRESSION: 1. Moderately motion degraded examination. 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 Largest in RIGHT cerebellum measuring 22 mm. 2. 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 Old cerebellar, old LEFT thalamus and old LEFT caudate small infarcts. 3. Mild chronic small vessel ischemic changes. 4. These results will be called to the ordering clinician or representative by the Radiologist Assistant, and communication documented in the  PACS  or zVision Dashboard. Electronically Signed   By: Elon Alas M.D.   On: 02/14/2018 00:11   US Biopsy (liver)  Result Date: 02/11/2018 INDICATION: Lung mass.  Liver lesions.  Bone lesions. EXAM: ULTRASOUND-GUIDED CORE BIOPSY OF A LIVER LESION MEDICATIONS: None. ANESTHESIA/SEDATION: Fentanyl 75 mcg IV; Versed 1.5 mg IV Moderate Sedation Time:  12 minutes The patient was continuously monitored during the procedure by the interventional radiology nurse under my direct supervision. FLUOROSCOPY TIME:  Fluoroscopy Time:  minutes  seconds ( mGy). COMPLICATIONS: None immediate. PROCEDURE: Informed written consent was obtained from the patient after a thorough discussion of the procedural risks, benefits and alternatives. All questions were addressed. Maximal Sterile Barrier Technique was utilized including caps, mask, sterile gowns, sterile gloves, sterile drape, hand hygiene and skin antiseptic. A timeout was performed prior to the initiation of the procedure. The right upper quadrant was prepped with ChloraPrep in a sterile fashion, and a sterile drape was applied covering the operative field. A sterile gown and sterile gloves were used for the procedure. Under sonographic guidance, an 17 gauge guide needle was advanced into the liver lesion adjacent to the gallbladder. Subsequently 2 18 gauge core biopsies were obtained. Gel-Foam slurry was injected into the needle tract. The guide needle was removed. Final imaging was performed. Patient tolerated the procedure well without complication. Vital sign monitoring by nursing staff during the procedure will continue as patient is in the special procedures unit for post procedure observation. FINDINGS: The images document guide needle placement within the liver lesion. Post biopsy images demonstrate no hemorrhage. IMPRESSION: Successful ultrasound-guided core biopsy of a liver lesion adjacent to the gallbladder. Electronically Signed   By: Marybelle Killings M.D.   On:  02/11/2018 12:43   Ir Imaging Guided Port Insertion  Result Date: 02/11/2018 CLINICAL DATA:  Lung mass EXAM: TUNNEL POWER PORT PLACEMENT WITH SUBCUTANEOUS POCKET UTILIZING ULTRASOUND & FLOUROSCOPY FLUOROSCOPY TIME:  24 seconds.  Four mGy. MEDICATIONS AND MEDICAL HISTORY: Versed 2 mg, Fentanyl 75 mcg. Additional Medications: Ancef 2 g. Antibiotics were given within 2 hours of the procedure. ANESTHESIA/SEDATION: Moderate sedation time: 34 minutes. Nursing monitored the the patient during the procedure. PROCEDURE: After written informed consent was obtained, patient was placed in the supine position on angiographic table. The right neck and chest was prepped and draped in a sterile fashion. Lidocaine was utilized for local anesthesia. The right jugular vein was noted to be patent initially with ultrasound. Under sonographic guidance, a micropuncture needle was inserted into the right IJ vein (Ultrasound and fluoroscopic image documentation was performed). The needle was removed over an 018 wire which was exchanged for a Amplatz. This was advanced into the IVC. An 8-French dilator was advanced over the Amplatz. A small incision was made in the right upper chest over the anterior right second rib. Utilizing blunt dissection, a subcutaneous pocket was created in the caudal direction. The pocket was irrigated with a copious amount of sterile normal saline. The port catheter was tunneled from the chest incision, and out the neck incision. The reservoir was inserted into the subcutaneous pocket and secured with two 3-0 Ethilon stitches. A peel-away sheath was advanced over the Amplatz wire. The port catheter was cut to measure length and inserted through the peel-away sheath. The peel-away sheath was removed. The chest incision was closed with 3-0 Vicryl interrupted stitches for the subcutaneous tissue and a running of 4-0 Vicryl subcuticular stitch for the skin. The neck incision was closed with a 4-0 Vicryl  subcuticular  stitch. Derma-bond was applied to both surgical incisions. The port reservoir was flushed and instilled with heparinized saline. No complications. FINDINGS: A right IJ vein Port-A-Cath is in place with its tip at the cavoatrial junction. COMPLICATIONS: None IMPRESSION: Successful 8 French right internal jugular vein power port placement with its tip at the SVC/RA junction. Electronically Signed   By: Marybelle Killings M.D.   On: 02/11/2018 14:21     Assessment and plan- Patient is a 57 y.o. female with adenocarcinoma of the lung stage IV CT3N2M1B with metastases to the liver brain.  She is here for on treatment assessment prior to cycle 2 of carboplatin and Alimta and Keytruda  Counts okay to proceed with cycle 2 of carboplatin and Alimta and Keytruda today.  I will see her back in 3 weeks time for cycle #3 with CBC and CMP.  We will obtain interim scans CT chest abdomen and pelvis with contrast after these 2 cycles.  Mild hyperkalemia: Potassium is 5.4.  We will give her 3 doses of Kayexalate.  Patient is also taking Lasix for her leg edema which was prescribed by cardiology and hopefully that should help her with hyperkalemia as well  Neoplasm related pain: Continue OxyContin and oxycodone.  If her right hip pain gets worse I will refer her for palliative radiation.  Plan at this time is to finish 4 cycles of induction chemo immunotherapy following which I will refer her to radiation oncology to complete whole brain radiation and possibly palliative radiation to her right hip.   Visit Diagnosis 1. Malignant neoplasm of upper lobe of left lung ([place])   2. Encounter for antineoplastic chemotherapy   3. Encounter for antineoplastic immunotherapy   4. Hyperkalemia   5. Neoplasm related pain      Dr. Randa Evens, MD, MPH [facility] [phone] 03/13/2018 8:26 AM

## 2018-03-18 ENCOUNTER — Other Ambulatory Visit: Payer: Self-pay | Admitting: *Deleted

## 2018-03-18 ENCOUNTER — Telehealth: Payer: Self-pay | Admitting: *Deleted

## 2018-03-18 MED ORDER — POLYETHYLENE GLYCOL 3350 17 GM/SCOOP PO POWD: 17.0000 g | Freq: Every day | ORAL | 1 refills | Status: AC

## 2018-03-18 NOTE — Telephone Encounter (Signed)
Pt requested prescription for miralax since insurance may cover at cheaper cost than purchasing over the counter. rx escribed to pharmacy.

## 2018-03-24 ENCOUNTER — Telehealth: Payer: Self-pay | Admitting: *Deleted

## 2018-03-24 NOTE — Telephone Encounter (Signed)
Pt called to report that noticed small amounts of blood one time yesterday and one time today when coughing up phlegm. Informed pt to continue to monitor at this time and to call back if hemoptysis becomes more frequent with larger amounts of blood. Reassured provided. Pt verbalized understanding.

## 2018-03-31 ENCOUNTER — Other Ambulatory Visit: Payer: Self-pay | Admitting: *Deleted

## 2018-03-31 DIAGNOSIS — C3490 Malignant neoplasm of unspecified part of unspecified bronchus or lung: Secondary | ICD-10-CM

## 2018-03-31 DIAGNOSIS — K1379 Other lesions of oral mucosa: Secondary | ICD-10-CM

## 2018-03-31 MED ORDER — OXYCODONE HCL ER 10 MG PO T12A: 10.0000 mg | EXTENDED_RELEASE_TABLET | Freq: Two times a day (BID) | ORAL | 0 refills | Status: DC

## 2018-03-31 MED ORDER — MAGIC MOUTHWASH: 10.0000 mL | Freq: Three times a day (TID) | ORAL | 0 refills | Status: DC

## 2018-03-31 MED ORDER — OXYCODONE HCL 5 MG PO TABS: 5.0000 mg | ORAL_TABLET | Freq: Four times a day (QID) | ORAL | 0 refills | Status: DC | PRN

## 2018-03-31 NOTE — Telephone Encounter (Signed)
Pt called in to report starting having mouth sores and decreased appetite. Requests if could get medication mouthwash and appetite stimulant if needed. Also, requests refill of pain medication. Per Dr. Janese Banks, will send in prescription for magic mouthwash. Appetite stimulant will be discussed at next MD visit with patient.

## 2018-04-02 ENCOUNTER — Inpatient Hospital Stay: Payer: Commercial Managed Care - PPO | Attending: Oncology

## 2018-04-02 ENCOUNTER — Inpatient Hospital Stay: Payer: Commercial Managed Care - PPO

## 2018-04-02 ENCOUNTER — Inpatient Hospital Stay
Admission: AD | Admit: 2018-04-02 | Discharge: 2018-04-06 | DRG: 641 | Disposition: A | Discharge status: Home / Self Care | Payer: Commercial Managed Care - PPO | Source: Ambulatory Visit | Attending: Internal Medicine | Admitting: Internal Medicine

## 2018-04-02 ENCOUNTER — Telehealth: Payer: Self-pay | Admitting: *Deleted

## 2018-04-02 ENCOUNTER — Other Ambulatory Visit: Payer: Self-pay

## 2018-04-02 ENCOUNTER — Encounter: Payer: Self-pay | Admitting: Oncology

## 2018-04-02 ENCOUNTER — Inpatient Hospital Stay (HOSPITAL_BASED_OUTPATIENT_CLINIC_OR_DEPARTMENT_OTHER): Payer: Commercial Managed Care - PPO | Admitting: Oncology

## 2018-04-02 ENCOUNTER — Other Ambulatory Visit: Payer: Self-pay | Admitting: *Deleted

## 2018-04-02 ENCOUNTER — Encounter: Payer: Self-pay | Admitting: Internal Medicine

## 2018-04-02 VITALS — BP 107/70 | HR 117 | Temp 97.9°F | Resp 18 | Ht 64.0 in | Wt 160.7 lb

## 2018-04-02 DIAGNOSIS — Z66 Do not resuscitate: Secondary | ICD-10-CM | POA: Diagnosis present

## 2018-04-02 DIAGNOSIS — C3412 Malignant neoplasm of upper lobe, left bronchus or lung: Secondary | ICD-10-CM

## 2018-04-02 DIAGNOSIS — C7951 Secondary malignant neoplasm of bone: Secondary | ICD-10-CM | POA: Diagnosis present

## 2018-04-02 DIAGNOSIS — I110 Hypertensive heart disease with heart failure: Secondary | ICD-10-CM | POA: Diagnosis present

## 2018-04-02 DIAGNOSIS — Z87891 Personal history of nicotine dependence: Secondary | ICD-10-CM

## 2018-04-02 DIAGNOSIS — R0602 Shortness of breath: Secondary | ICD-10-CM | POA: Diagnosis not present

## 2018-04-02 DIAGNOSIS — R0902 Hypoxemia: Secondary | ICD-10-CM | POA: Diagnosis present

## 2018-04-02 DIAGNOSIS — E875 Hyperkalemia: Secondary | ICD-10-CM

## 2018-04-02 DIAGNOSIS — I509 Heart failure, unspecified: Secondary | ICD-10-CM | POA: Diagnosis present

## 2018-04-02 DIAGNOSIS — E860 Dehydration: Secondary | ICD-10-CM | POA: Diagnosis present

## 2018-04-02 DIAGNOSIS — K210 Gastro-esophageal reflux disease with esophagitis: Secondary | ICD-10-CM | POA: Diagnosis present

## 2018-04-02 DIAGNOSIS — Z9981 Dependence on supplemental oxygen: Secondary | ICD-10-CM

## 2018-04-02 DIAGNOSIS — N179 Acute kidney failure, unspecified: Secondary | ICD-10-CM | POA: Diagnosis present

## 2018-04-02 DIAGNOSIS — T451X5A Adverse effect of antineoplastic and immunosuppressive drugs, initial encounter: Secondary | ICD-10-CM | POA: Diagnosis present

## 2018-04-02 DIAGNOSIS — Z515 Encounter for palliative care: Secondary | ICD-10-CM | POA: Diagnosis not present

## 2018-04-02 DIAGNOSIS — J449 Chronic obstructive pulmonary disease, unspecified: Secondary | ICD-10-CM | POA: Diagnosis present

## 2018-04-02 DIAGNOSIS — Z7901 Long term (current) use of anticoagulants: Secondary | ICD-10-CM

## 2018-04-02 DIAGNOSIS — E119 Type 2 diabetes mellitus without complications: Secondary | ICD-10-CM | POA: Diagnosis present

## 2018-04-02 DIAGNOSIS — R000 Tachycardia, unspecified: Secondary | ICD-10-CM | POA: Diagnosis not present

## 2018-04-02 DIAGNOSIS — C787 Secondary malignant neoplasm of liver and intrahepatic bile duct: Secondary | ICD-10-CM | POA: Diagnosis present

## 2018-04-02 DIAGNOSIS — D638 Anemia in other chronic diseases classified elsewhere: Secondary | ICD-10-CM | POA: Diagnosis not present

## 2018-04-02 DIAGNOSIS — Z833 Family history of diabetes mellitus: Secondary | ICD-10-CM

## 2018-04-02 DIAGNOSIS — D72829 Elevated white blood cell count, unspecified: Secondary | ICD-10-CM

## 2018-04-02 DIAGNOSIS — G8929 Other chronic pain: Secondary | ICD-10-CM | POA: Diagnosis present

## 2018-04-02 DIAGNOSIS — C3490 Malignant neoplasm of unspecified part of unspecified bronchus or lung: Secondary | ICD-10-CM

## 2018-04-02 DIAGNOSIS — D6481 Anemia due to antineoplastic chemotherapy: Secondary | ICD-10-CM | POA: Diagnosis present

## 2018-04-02 DIAGNOSIS — Z7189 Other specified counseling: Secondary | ICD-10-CM | POA: Diagnosis not present

## 2018-04-02 DIAGNOSIS — C779 Secondary and unspecified malignant neoplasm of lymph node, unspecified: Secondary | ICD-10-CM | POA: Diagnosis not present

## 2018-04-02 DIAGNOSIS — E785 Hyperlipidemia, unspecified: Secondary | ICD-10-CM | POA: Diagnosis present

## 2018-04-02 HISTORY — DX: Malignant neoplasm of unspecified part of unspecified bronchus or lung: C34.90

## 2018-04-02 LAB — IRON AND TIBC
Iron: 29 ug/dL (ref 28–170)
SATURATION RATIOS: 15 % (ref 10.4–31.8)
TIBC: 192 ug/dL — AB (ref 250–450)
UIBC: 163 ug/dL

## 2018-04-02 LAB — CBC WITH DIFFERENTIAL/PLATELET
BASOS PCT: 0 %
Basophils Absolute: 0 10*3/uL (ref 0–0.1)
EOS ABS: 0 10*3/uL (ref 0–0.7)
EOS PCT: 0 %
HCT: 27.9 % — ABNORMAL LOW (ref 35.0–47.0)
HEMOGLOBIN: 9.1 g/dL — AB (ref 12.0–16.0)
LYMPHS ABS: 1.2 10*3/uL (ref 1.0–3.6)
Lymphocytes Relative: 6 %
MCH: 28.3 pg (ref 26.0–34.0)
MCHC: 32.5 g/dL (ref 32.0–36.0)
MCV: 87.1 fL (ref 80.0–100.0)
MONO ABS: 0.5 10*3/uL (ref 0.2–0.9)
MONOS PCT: 3 %
NEUTROS ABS: 16.9 10*3/uL — AB (ref 1.4–6.5)
Neutrophils Relative %: 91 %
PLATELETS: 600 10*3/uL — AB (ref 150–440)
RBC: 3.2 MIL/uL — AB (ref 3.80–5.20)
RDW: 16.4 % — ABNORMAL HIGH (ref 11.5–14.5)
WBC: 18.5 10*3/uL — ABNORMAL HIGH (ref 3.6–11.0)

## 2018-04-02 LAB — COMPREHENSIVE METABOLIC PANEL
ALT: 29 U/L (ref 0–44)
AST: 27 U/L (ref 15–41)
Albumin: 2.5 g/dL — ABNORMAL LOW (ref 3.5–5.0)
Alkaline Phosphatase: 303 U/L — ABNORMAL HIGH (ref 38–126)
Anion gap: 13 (ref 5–15)
BILIRUBIN TOTAL: 0.4 mg/dL (ref 0.3–1.2)
BUN: 58 mg/dL — AB (ref 6–20)
CHLORIDE: 100 mmol/L (ref 98–111)
CO2: 24 mmol/L (ref 22–32)
Calcium: 9.8 mg/dL (ref 8.9–10.3)
Creatinine, Ser: 1.02 mg/dL — ABNORMAL HIGH (ref 0.44–1.00)
GFR calc Af Amer: >60 mL/min (ref >60)
GFR, EST NON AFRICAN AMERICAN: 59 mL/min — AB (ref >60)
Glucose, Bld: 164 mg/dL — ABNORMAL HIGH (ref 70–99)
POTASSIUM: 6.8 mmol/L — AB (ref 3.5–5.1)
Sodium: 137 mmol/L (ref 135–145)
Total Protein: 8.1 g/dL (ref 6.5–8.1)

## 2018-04-02 LAB — POTASSIUM
POTASSIUM: 6.1 mmol/L — AB (ref 3.5–5.1)
Potassium: 6.7 mmol/L (ref 3.5–5.1)
Potassium: 5.9 mmol/L — ABNORMAL HIGH (ref 3.5–5.1)

## 2018-04-02 LAB — FOLATE: FOLATE: 19.9 ng/mL (ref >5.9)

## 2018-04-02 LAB — PROTIME-INR
INR: 1.95
PROTHROMBIN TIME: 22.1 s — AB (ref 11.4–15.2)

## 2018-04-02 LAB — VITAMIN B12: Vitamin B-12: 6358 pg/mL — ABNORMAL HIGH (ref 180–914)

## 2018-04-02 LAB — FERRITIN: FERRITIN: 405 ng/mL — AB (ref 11–307)

## 2018-04-02 MED ORDER — SODIUM CHLORIDE 0.9% FLUSH
10.0000 mL | INTRAVENOUS | Status: AC | PRN
  Administered 2018-04-02: 10 mL via INTRAVENOUS
  Filled 2018-04-02: qty 10

## 2018-04-02 MED ORDER — ACETAMINOPHEN 500 MG PO TABS: 1000.0000 mg | ORAL_TABLET | Freq: Four times a day (QID) | ORAL | Status: DC | PRN

## 2018-04-02 MED ORDER — FUROSEMIDE 10 MG/ML IJ SOLN
20.0000 mg | Freq: Two times a day (BID) | INTRAMUSCULAR | Status: DC
  Administered 2018-04-02 – 2018-04-06 (×9): 20 mg via INTRAVENOUS
  Filled 2018-04-02 (×9): qty 2

## 2018-04-02 MED ORDER — PANTOPRAZOLE SODIUM 40 MG PO TBEC
40.0000 mg | DELAYED_RELEASE_TABLET | Freq: Every day | ORAL | Status: DC
  Administered 2018-04-03 – 2018-04-06 (×4): 40 mg via ORAL
  Filled 2018-04-02 (×5): qty 1

## 2018-04-02 MED ORDER — SODIUM ZIRCONIUM CYCLOSILICATE 5 G PO PACK
10.0000 g | PACK | Freq: Every day | ORAL | Status: DC
  Administered 2018-04-02: 10 g via ORAL
  Filled 2018-04-02: qty 2

## 2018-04-02 MED ORDER — ACETAMINOPHEN 325 MG PO TABS
650.0000 mg | ORAL_TABLET | Freq: Four times a day (QID) | ORAL | Status: DC | PRN
  Administered 2018-04-03 – 2018-04-06 (×2): 650 mg via ORAL
  Filled 2018-04-02 (×2): qty 2

## 2018-04-02 MED ORDER — TIOTROPIUM BROMIDE MONOHYDRATE 18 MCG IN CAPS
18.0000 ug | ORAL_CAPSULE | Freq: Every day | RESPIRATORY_TRACT | Status: DC
  Administered 2018-04-03 – 2018-04-06 (×4): 18 ug via RESPIRATORY_TRACT
  Filled 2018-04-02: qty 5

## 2018-04-02 MED ORDER — SODIUM ZIRCONIUM CYCLOSILICATE 5 G PO PACK
10.0000 g | PACK | Freq: Three times a day (TID) | ORAL | Status: DC
  Administered 2018-04-03 – 2018-04-04 (×6): 10 g via ORAL
  Filled 2018-04-02 (×9): qty 2

## 2018-04-02 MED ORDER — ONDANSETRON HCL 4 MG PO TABS: 4.0000 mg | ORAL_TABLET | Freq: Four times a day (QID) | ORAL | Status: DC | PRN

## 2018-04-02 MED ORDER — INSULIN ASPART 100 UNIT/ML IV SOLN
10.0000 [IU] | Freq: Once | INTRAVENOUS | Status: AC
  Administered 2018-04-02: 10 [IU] via INTRAVENOUS
  Filled 2018-04-02: qty 0.1

## 2018-04-02 MED ORDER — SODIUM CHLORIDE 0.9% FLUSH: 3.0000 mL | INTRAVENOUS | Status: DC | PRN

## 2018-04-02 MED ORDER — IPRATROPIUM-ALBUTEROL 0.5-2.5 (3) MG/3ML IN SOLN
3.0000 mL | RESPIRATORY_TRACT | Status: DC
  Administered 2018-04-02 – 2018-04-06 (×25): 3 mL via RESPIRATORY_TRACT
  Filled 2018-04-02 (×26): qty 3

## 2018-04-02 MED ORDER — ATORVASTATIN CALCIUM 20 MG PO TABS
40.0000 mg | ORAL_TABLET | Freq: Every day | ORAL | Status: DC
  Administered 2018-04-02 – 2018-04-06 (×5): 40 mg via ORAL
  Filled 2018-04-02 (×5): qty 2

## 2018-04-02 MED ORDER — OXYCODONE HCL 5 MG PO TABS
5.0000 mg | ORAL_TABLET | Freq: Four times a day (QID) | ORAL | Status: DC | PRN
  Administered 2018-04-02 – 2018-04-04 (×7): 5 mg via ORAL
  Filled 2018-04-02 (×7): qty 1

## 2018-04-02 MED ORDER — OXYCODONE HCL ER 10 MG PO T12A: 10.0000 mg | EXTENDED_RELEASE_TABLET | Freq: Two times a day (BID) | ORAL | 0 refills | Status: DC

## 2018-04-02 MED ORDER — HEPARIN SOD (PORK) LOCK FLUSH 100 UNIT/ML IV SOLN: 500.0000 [IU] | Freq: Once | INTRAVENOUS | Status: AC

## 2018-04-02 MED ORDER — DEXTROSE 50 % IV SOLN
1.0000 | Freq: Once | INTRAVENOUS | Status: AC
  Administered 2018-04-02: 50 mL via INTRAVENOUS
  Filled 2018-04-02: qty 50

## 2018-04-02 MED ORDER — SODIUM POLYSTYRENE SULFONATE 15 GM/60ML PO SUSP: 30.0000 g | Freq: Once | ORAL | Status: AC

## 2018-04-02 MED ORDER — SODIUM CHLORIDE 0.9% FLUSH
3.0000 mL | Freq: Two times a day (BID) | INTRAVENOUS | Status: DC
  Administered 2018-04-02 – 2018-04-03 (×2): 3 mL via INTRAVENOUS

## 2018-04-02 MED ORDER — ACETAMINOPHEN 650 MG RE SUPP
650.0000 mg | Freq: Four times a day (QID) | RECTAL | Status: DC | PRN
  Administered 2018-04-04 – 2018-04-05 (×2): 650 mg via RECTAL
  Filled 2018-04-02 (×2): qty 1

## 2018-04-02 MED ORDER — LORATADINE 10 MG PO TABS
10.0000 mg | ORAL_TABLET | Freq: Every day | ORAL | Status: DC
  Administered 2018-04-02 – 2018-04-06 (×5): 10 mg via ORAL
  Filled 2018-04-02 (×5): qty 1

## 2018-04-02 MED ORDER — INSULIN ASPART 100 UNIT/ML IV SOLN
10.0000 [IU] | Freq: Once | INTRAVENOUS | Status: AC
  Administered 2018-04-02: 14:00:00 10 [IU] via INTRAVENOUS
  Filled 2018-04-02: qty 0.1

## 2018-04-02 MED ORDER — SODIUM CHLORIDE 0.9 % IV SOLN: 250.0000 mL | INTRAVENOUS | Status: DC | PRN

## 2018-04-02 MED ORDER — OXYCODONE HCL ER 10 MG PO T12A
10.0000 mg | EXTENDED_RELEASE_TABLET | Freq: Two times a day (BID) | ORAL | Status: DC
  Administered 2018-04-02 – 2018-04-06 (×9): 10 mg via ORAL
  Filled 2018-04-02 (×9): qty 1

## 2018-04-02 MED ORDER — WARFARIN SODIUM 6 MG PO TABS: 6.0000 mg | ORAL_TABLET | Freq: Every day | ORAL | Status: DC

## 2018-04-02 MED ORDER — WARFARIN SODIUM 2.5 MG PO TABS
2.5000 mg | ORAL_TABLET | Freq: Once | ORAL | Status: AC
  Administered 2018-04-02: 2.5 mg via ORAL
  Filled 2018-04-02: qty 1

## 2018-04-02 MED ORDER — WARFARIN - PHARMACIST DOSING INPATIENT
Freq: Every day | Status: DC
  Administered 2018-04-03 – 2018-04-05 (×3)

## 2018-04-02 MED ORDER — ALBUTEROL SULFATE (2.5 MG/3ML) 0.083% IN NEBU: 10.0000 mg | INHALATION_SOLUTION | Freq: Once | RESPIRATORY_TRACT | Status: DC

## 2018-04-02 MED ORDER — SODIUM BICARBONATE 8.4 % IV SOLN
50.0000 meq | Freq: Once | INTRAVENOUS | Status: AC
  Administered 2018-04-02: 14:00:00 50 meq via INTRAVENOUS
  Filled 2018-04-02: qty 50

## 2018-04-02 MED ORDER — MOMETASONE FURO-FORMOTEROL FUM 200-5 MCG/ACT IN AERO
2.0000 | INHALATION_SPRAY | Freq: Two times a day (BID) | RESPIRATORY_TRACT | Status: DC
  Administered 2018-04-03 – 2018-04-06 (×7): 2 via RESPIRATORY_TRACT
  Filled 2018-04-02: qty 8.8

## 2018-04-02 MED ORDER — ONDANSETRON HCL 4 MG/2ML IJ SOLN: 4.0000 mg | Freq: Four times a day (QID) | INTRAMUSCULAR | Status: DC | PRN

## 2018-04-02 NOTE — Telephone Encounter (Signed)
[facility] on [address] [facility] not [facility] oxycontin and ordered it and itmay be available tom. But note sure/ family would like to go ahead and get it and not have tokeep waiting for it to come at [facility] [address]. So I will send it to [facility] and family will pick up and I have called [facility] on [address] and told them to cancel the one for their store.

## 2018-04-02 NOTE — Progress Notes (Signed)
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 She smoked about 1-2 pack's per day for about 15 years. She says that currently she has not smoked for the last 1 week. She had been having ongoing symptoms of shortness of breath and was treated with oral antibiotics.  However her shortness of breath did not improve and this led to a chest x-ray followed by a CT scan in July 2019. CT chest showed a 6 x 6.1 x 4.3 cm left upper lobe mass concerning for malignancy along with left hilar and aortopulmonary window adenopathy. 10 mm nodule in the right middle lobe. Multiple sclerotic densities noted in the thoracic spine may represent metastatic disease.  This was followed by a PET/CT scan which showed 8.9 cm left upper lobe mass with an SUV of 16.9 along with adjacent precarinal and mediastinal adenopathy. Multiple hypermetabolic hepatic foci suspicious for metastatic disease. Right middle lobe pulmonary nodule not significantly hypermetabolic. Multiple osseous metastases within the left femoral neck with an SUV of 5.4 and a sclerotic right anterior acetabular region with an SUV of 9.5.  Patient lives alone and is independent of her ADLs and IADLs. She is not on any home oxygen.  She does report feeling fatigued and has lost about 15 pounds over the last 2 months. She reports poor appetite. Reports back pain which is radiating to her bilateral arms as well as radiating to her right lower extremity. She does not have any children and her only close family is her sister.  Ultrasound-guided liver biopsy showed adenocarcinoma consistent with lung origin.TTF1 positiveOmniseqtesting showed KRAS mutation TMB high. PDL1 IHC 0%  MRI brain showed:IMPRESSION: 1. Moderately motion degraded examination. 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 Largest in RIGHT cerebellum measuring 22 mm. 2. 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 Old cerebellar, old LEFT thalamus and old LEFT caudate small infarcts. 3. Mild chronic small vessel ischemic changes.   Interval history-patient reports that her back pain is gradually getting worse.  She is currently on PRN oxycodone but did not get OxyContin from the pharmacy as he did not have it in stock.  She did not call us to let us know about this.  She has not had any fevers.  She does have mild right hip pain and walks with a limp which has essentially remained unchanged.  Her ambulation is also somewhat limited due to her bilateral leg swelling  ECOG PS- 2 Pain scale- 4 Opioid associated constipation- no  Review of systems- Review of Systems  Constitutional: Positive for malaise/fatigue.  Respiratory: Positive for shortness of breath.   Cardiovascular: Positive for leg swelling.       Allergies  Allergen Reactions  . Other Shortness Of Breath    Beta blocker      Past Medical History:  Diagnosis Date  .  Asthma   . CHF (congestive heart failure) ([place])   . COPD (chronic obstructive pulmonary disease) ([place])   . Diabetes mellitus without complication ([place])   . Hypercholesteremia   . Hypertension   . Reflux esophagitis      Past Surgical  History:  Procedure Laterality Date  . IR IMAGING GUIDED PORT INSERTION  02/11/2018    Social History   Socioeconomic History  . Marital status: Single    Spouse name: Not on file  . Number of children: Not on file  . Years of education: Not on file  . Highest education level: Not on file  Occupational History  . Not on file  Social Needs  . Financial resource strain: Not on file  . Food insecurity:    Worry: Not on file    Inability: Not on file  . Transportation needs:    Medical: Not on file    Non-medical: Not on file  Tobacco Use  . Smoking status: Former Smoker    Last attempt to quit: 01/12/2018    Years since quitting: 0.2  . Smokeless tobacco: Never Used  Substance and Sexual Activity  . Alcohol use: Yes    Frequency: Never    Comment: socially  . Drug use: No  . Sexual activity: Not on file  Lifestyle  . Physical activity:    Days per week: Not on file    Minutes per session: Not on file  . Stress: Not on file  Relationships  . Social connections:    Talks on phone: Not on file    Gets together: Not on file    Attends religious service: Not on file    Active member of club or organization: Not on file    Attends meetings of clubs or organizations: Not on file    Relationship status: Not on file  . Intimate partner violence:    Fear of current or ex partner: Not on file    Emotionally abused: Not on file    Physically abused: Not on file    Forced sexual activity: Not on file  Other Topics Concern  . Not on file  Social History Narrative  . Not on file    History reviewed. No pertinent family history.   Current Outpatient Medications:  .  acetaminophen (TYLENOL) 500 MG tablet, Take 1,000 mg by mouth every 6 (six) hours as needed., Disp: , Rfl:  .  ADVAIR DISKUS 500-50 MCG/DOSE AEPB, Inhale 1 puff into the lungs every 12 (twelve) hours., Disp: , Rfl: 0 .  atorvastatin (LIPITOR) 40 MG tablet, Take 40 mg by mouth daily. , Disp: , Rfl:  .   fexofenadine (ALLEGRA) 180 MG tablet, Take 180 mg by mouth daily., Disp: , Rfl:  .  furosemide (LASIX) 20 MG tablet, Take 20 mg by mouth. PRN, Disp: , Rfl:  .  ipratropium-albuterol (DUONEB) 0.5-2.5 (3) MG/3ML SOLN, Take 3 mLs by nebulization every 4 (four) hours., Disp: 360 mL, Rfl: 0 .  magic mouthwash SOLN, Take 10 mLs by mouth 3 (three) times daily., Disp: 300 mL, Rfl: 0 .  metFORMIN (GLUCOPHAGE-XR) 750 MG 24 hr tablet, Take 1 tablet by mouth 2 (two) times daily., Disp: , Rfl: 3 .  montelukast (SINGULAIR) 10 MG tablet, Take 10 mg by mouth daily., Disp: , Rfl: 3 .  omeprazole (PRILOSEC) 20 MG capsule, Take 20 mg by mouth daily., Disp: , Rfl:  .  oxyCODONE (OXY IR/ROXICODONE) 5 MG immediate release tablet, Take 1 tablet (5  mg total) by mouth every 6 (six) hours as needed for severe pain., Disp: 60 tablet, Rfl: 0 .  oxyCODONE (OXYCONTIN) 10 mg 12 hr tablet, Take 1 tablet (10 mg total) by mouth every 12 (twelve) hours., Disp: 60 tablet, Rfl: 0 .  predniSONE (DELTASONE) 10 MG tablet, 60 mg X1 followed by 50 mg X1, 40 mg X1, 30 mg X1, 20 mg X1, 10 mg X1 then stop, Disp: 21 tablet, Rfl: 0 .  SPIRIVA HANDIHALER 18 MCG inhalation capsule, Place 1 puff into inhaler and inhale daily., Disp: , Rfl: 0 .  warfarin (COUMADIN) 6 MG tablet, Take 1 tablet (6 mg total) by mouth daily at 6 PM. TAKE 10 MG BY MOUTH ON Monday AND 7.5MG DAILY ALL OTHER DAYS (Patient taking differently: Take 5 mg by mouth daily at 6 PM. TAKE 10 MG BY MOUTH ON Monday AND 7.5MG DAILY ALL OTHER DAYS), Disp: 30 tablet, Rfl: 0 .  dexamethasone (DECADRON) 4 MG tablet, Take 1 tab two times a day the day before Alimta chemo. Take 2 tabs two times a day starting the day after chemo for 3 days. (Patient not taking: Reported on 02/18/2018), Disp: 30 tablet, Rfl: 1 .  folic acid (FOLVITE) 1 MG tablet, Take 1 tablet (1 mg total) by mouth daily. (Patient not taking: Reported on 02/18/2018), Disp: 30 tablet, Rfl: 6 .  guaiFENesin-dextromethorphan  (ROBITUSSIN DM) 100-10 MG/5ML syrup, Take 15 mLs by mouth every 4 (four) hours as needed for cough. (Patient not taking: Reported on 04/02/2018), Disp: 118 mL, Rfl: 0 .  lidocaine-prilocaine (EMLA) cream, Apply to affected area once (Patient not taking: Reported on 02/18/2018), Disp: 30 g, Rfl: 3 .  LORazepam (ATIVAN) 0.5 MG tablet, Take 1 tablet (0.5 mg total) by mouth every 6 (six) hours as needed (Nausea or vomiting). (Patient not taking: Reported on 02/18/2018), Disp: 30 tablet, Rfl: 0 .  nicotine (NICODERM CQ - DOSED IN MG/24 HOURS) 21 mg/24hr patch, Place 1 patch (21 mg total) onto the skin daily. (Patient not taking: Reported on 03/03/2018), Disp: 28 patch, Rfl: 0 .  ondansetron (ZOFRAN) 8 MG tablet, Take 1 tablet (8 mg total) by mouth every 8 (eight) hours as needed. (Patient not taking: Reported on 02/18/2018), Disp: 30 tablet, Rfl: 1 .  polyethylene glycol powder (GLYCOLAX/MIRALAX) powder, Take 17 g by mouth daily. (Patient not taking: Reported on 04/02/2018), Disp: 255 g, Rfl: 1 .  PROAIR HFA 108 (90 Base) MCG/ACT inhaler, Inhale 2 puffs into the lungs 4 (four) times daily as needed. (Patient not taking: Reported on 03/03/2018), Disp: 18 g, Rfl: 1 .  prochlorperazine (COMPAZINE) 10 MG tablet, Take 1 tablet (10 mg total) by mouth every 6 (six) hours as needed (Nausea or vomiting). (Patient not taking: Reported on 02/18/2018), Disp: 30 tablet, Rfl: 1 .  prochlorperazine (COMPAZINE) 25 MG suppository, Place 1 suppository (25 mg total) rectally every 12 (twelve) hours as needed for nausea. (Patient not taking: Reported on 02/18/2018), Disp: 12 suppository, Rfl: 3 No current facility-administered medications for this visit.   Facility-Administered Medications Ordered in Other Visits:  .  heparin lock flush 100 unit/mL, 500 Units, Intravenous, Once, Randa Evens C, MD .  sodium chloride flush (NS) 0.9 % injection 10 mL, 10 mL, Intravenous, PRN, Sindy Guadeloupe, MD, 10 mL at 04/02/18 0821  Physical exam:    Vitals:   04/02/18 0838  BP: 107/70  Pulse: (!) 117  Resp: 18  Temp: 97.9 F (36.6 C)  TempSrc: Tympanic  SpO2: 94%  Weight: 160 lb 11.2 oz (72.9 kg)  Height: 5' 4"  (1.626 m)   Physical Exam  Constitutional: She is oriented to person, place, and time.  She appears older than stated age.  She is fatigued and sitting in a wheelchair  HENT:  Head: Normocephalic and atraumatic.  Eyes: Pupils are equal, round, and reactive to light. EOM are normal.  Neck: Normal range of motion.  Cardiovascular: Regular rhythm and normal heart sounds.  Tachycardic  Pulmonary/Chest: Effort normal and breath sounds normal.  Abdominal: Soft. Bowel sounds are normal.  Musculoskeletal: She exhibits edema (b/l +2).  Neurological: She is alert and oriented to person, place, and time.  Skin: Skin is warm and dry.     CMP Latest Ref Rng & Units 04/02/2018  Glucose 70 - 99 mg/dL -  BUN 6 - 20 mg/dL -  Creatinine 0.44 - 1.00 mg/dL -  Sodium 135 - 145 mmol/L -  Potassium 3.5 - 5.1 mmol/L 6.7(HH)  Chloride 98 - 111 mmol/L -  CO2 22 - 32 mmol/L -  Calcium 8.9 - 10.3 mg/dL -  Total Protein 6.5 - 8.1 g/dL -  Total Bilirubin 0.3 - 1.2 mg/dL -  Alkaline Phos 38 - 126 U/L -  AST 15 - 41 U/L -  ALT 0 - 44 U/L -   CBC Latest Ref Rng & Units 04/02/2018  WBC 3.6 - 11.0 K/uL 18.5(H)  Hemoglobin 12.0 - 16.0 g/dL 9.1(L)  Hematocrit 35.0 - 47.0 % 27.9(L)  Platelets 150 - 440 K/uL 600(H)      Assessment and plan- Patient is a 57 y.o. female withadenocarcinoma of the lung stage IV CT3N2M1B with metastases to the liver brain.  She is here for on treatment assessment prior to cycle 3 of carboplatin Alimta and Keytruda  1.  Patient has significant hyperkalemia with a potassium of 6.8 which was confirmed on repeat testing.  EKG in our office today showed mildly prolonged PR interval of 150 ms.  QTc was not prolonged.  Sinus tachycardia with a heart rate 114 bpm.  Given her hyperkalemia I would like the patient to be  admitted to the hospital for further management.  We did give her 1 dose of insulin dextrose as well as Kayexalate in the office today.  Because of her hyperkalemia is uncertain although continued 20% of patients will receive Keytruda can get hyperkalemic.  She is not on any other medications that can cause elevated potassium.  She did not have this problem before starting treatment.  I will consider outpatient nephrology input of her hyperkalemia persists  2.  Suspect bilateral leg edema secondary to ongoing malignancy and hypoproteinemia.  Her kidney functions have remained more or less stable and she does not have any AKI today  3.  Neoplasm related pain: We will make sure that she gets her outpatient OxyContin for better control of her pain.  4.  Stage IV lung cancer: Chemo will be on hold today.  It would be ideal if patient can get CT chest abdomen and pelvis while she is admitted as she was due for a scan after 2 cycles  5.  Patient is tachycardic and has an elevated white count of 18.5.  She did not receive any Neulasta with present treatment.  No localizing signs and symptoms of infection.  This needs to be monitored while she is inpatient  6.  Patient has a history of left ventricular thrombus and on chronic Coumadin because of that.  She was told that  she had a supratherapeutic INR a week ago and therefore she has not taken Coumadin since then.  We will get an INR today and if subtherapeutic she needs to restart her Coumadin   Visit Diagnosis 1. Malignant neoplasm of upper lobe of left lung ([place])   2. Hyperkalemia      Dr. Randa Evens, MD, MPH [facility] [phone] 04/02/2018 9:59 AM

## 2018-04-02 NOTE — H&P (Addendum)
[facility] [place] NAME: Laura Booth    MR#:  [id number]  DATE OF BIRTH:  [birth year]  DATE OF ADMISSION:  04/02/2018  PRIMARY CARE PHYSICIAN: Juluis Pitch, MD   REQUESTING/REFERRING PHYSICIAN: Randa Evens MD  CHIEF COMPLAINT:  No chief complaint on file.   HISTORY OF PRESENT ILLNESS: Laura Booth  is a 57 y.o. female with a known history of asthma, CHF, COPD, diabetes type 2, hyperlipidemia, hypertension and diagnosis of lung cancer who was supposed to get her third dose of chemo today.  When she had blood work checked showed that her potassium was very high therefore she is sent for admission.  Patient denies any excessive ingestion of potassium containing foods.  She has shortness of breath and complains of pain in her back and her shoulders.  PAST MEDICAL HISTORY:   Past Medical History:  Diagnosis Date  . Asthma   . CHF (congestive heart failure) ([place])   . COPD (chronic obstructive pulmonary disease) ([place])   . Diabetes mellitus without complication ([place])   . Hypercholesteremia   . Hypertension   . Lung cancer ([place])   . Reflux esophagitis     PAST SURGICAL HISTORY:  Past Surgical History:  Procedure Laterality Date  . IR IMAGING GUIDED PORT INSERTION  02/11/2018    SOCIAL HISTORY:  Social History   Tobacco Use  . Smoking status: Former Smoker    Last attempt to quit: 01/12/2018    Years since quitting: 0.2  . Smokeless tobacco: Never Used  Substance Use Topics  . Alcohol use: Yes    Frequency: Never    Comment: socially    FAMILY HISTORY:  Family History  Problem Relation Age of Onset  . Diabetes Mother     DRUG ALLERGIES:  Allergies  Allergen Reactions  . Other Shortness Of Breath    Beta blocker     REVIEW OF SYSTEMS:   CONSTITUTIONAL: No fever, fatigue or weakness.  EYES: No blurred or double vision.  EARS, NOSE, AND THROAT: No tinnitus or ear pain.  RESPIRATORY: No cough, positive shortness of  breath, positive wheezing or hemoptysis.  CARDIOVASCULAR: No chest pain, orthopnea, edema.  GASTROINTESTINAL: No nausea, vomiting, diarrhea or abdominal pain.  GENITOURINARY: No dysuria, hematuria.  ENDOCRINE: No polyuria, nocturia,  HEMATOLOGY: No anemia, easy bruising or bleeding SKIN: No rash or lesion. MUSCULOSKELETAL: No joint pain or arthritis.   NEUROLOGIC: No tingling, numbness, weakness.  PSYCHIATRY: No anxiety or depression.   MEDICATIONS AT HOME:  Prior to Admission medications   Medication Sig Start Date End Date Taking? Authorizing Provider  acetaminophen (TYLENOL) 500 MG tablet Take 1,000 mg by mouth every 6 (six) hours as needed.    [provider]  ADVAIR DISKUS 500-50 MCG/DOSE AEPB Inhale 1 puff into the lungs every 12 (twelve) hours. 08/25/17   [provider]  atorvastatin (LIPITOR) 40 MG tablet Take 40 mg by mouth daily.     [provider]  dexamethasone (DECADRON) 4 MG tablet Take 1 tab two times a day the day before Alimta chemo. Take 2 tabs two times a day starting the day after chemo for 3 days. Patient not taking: Reported on 02/18/2018 02/17/18   Sindy Guadeloupe, MD  fexofenadine (ALLEGRA) 180 MG tablet Take 180 mg by mouth daily.    [provider]  folic acid (FOLVITE) 1 MG tablet Take 1 tablet (1 mg total) by mouth daily. Patient not taking: Reported on 02/18/2018 02/17/18  Sindy Guadeloupe, MD  furosemide (LASIX) 20 MG tablet Take 20 mg by mouth. PRN    [provider]  guaiFENesin-dextromethorphan (ROBITUSSIN DM) 100-10 MG/5ML syrup Take 15 mLs by mouth every 4 (four) hours as needed for cough. Patient not taking: Reported on 04/02/2018 09/22/17   [name], MD  ipratropium-albuterol (DUONEB) 0.5-2.5 (3) MG/3ML SOLN Take 3 mLs by nebulization every 4 (four) hours. 09/22/17   [name], MD  lidocaine-prilocaine (EMLA) cream Apply to affected area once Patient not taking: Reported on 02/18/2018 02/17/18    Sindy Guadeloupe, MD  LORazepam (ATIVAN) 0.5 MG tablet Take 1 tablet (0.5 mg total) by mouth every 6 (six) hours as needed (Nausea or vomiting). Patient not taking: Reported on 02/18/2018 02/17/18   Sindy Guadeloupe, MD  magic mouthwash SOLN Take 10 mLs by mouth 3 (three) times daily. 03/31/18   Sindy Guadeloupe, MD  metFORMIN (GLUCOPHAGE-XR) 750 MG 24 hr tablet Take 1 tablet by mouth 2 (two) times daily. 09/06/17   [provider]  montelukast (SINGULAIR) 10 MG tablet Take 10 mg by mouth daily. 09/08/17   [provider]  nicotine (NICODERM CQ - DOSED IN MG/24 HOURS) 21 mg/24hr patch Place 1 patch (21 mg total) onto the skin daily. Patient not taking: Reported on 03/03/2018 09/23/17   [name], MD  omeprazole (PRILOSEC) 20 MG capsule Take 20 mg by mouth daily.    [provider]  ondansetron (ZOFRAN) 8 MG tablet Take 1 tablet (8 mg total) by mouth every 8 (eight) hours as needed. Patient not taking: Reported on 02/18/2018 02/17/18   Sindy Guadeloupe, MD  oxyCODONE (OXY IR/ROXICODONE) 5 MG immediate release tablet Take 1 tablet (5 mg total) by mouth every 6 (six) hours as needed for severe pain. 03/31/18   Sindy Guadeloupe, MD  oxyCODONE (OXYCONTIN) 10 mg 12 hr tablet Take 1 tablet (10 mg total) by mouth every 12 (twelve) hours. 03/31/18   Sindy Guadeloupe, MD  polyethylene glycol powder (GLYCOLAX/MIRALAX) powder Take 17 g by mouth daily. Patient not taking: Reported on 04/02/2018 03/18/18   Sindy Guadeloupe, MD  predniSONE (DELTASONE) 10 MG tablet 60 mg X1 followed by 50 mg X1, 40 mg X1, 30 mg X1, 20 mg X1, 10 mg X1 then stop 02/27/18   Sindy Guadeloupe, MD  PROAIR HFA 108 [phone] Base) MCG/ACT inhaler Inhale 2 puffs into the lungs 4 (four) times daily as needed. Patient not taking: Reported on 03/03/2018 09/22/17   [name], MD  prochlorperazine (COMPAZINE) 10 MG tablet Take 1 tablet (10 mg total) by mouth every 6 (six) hours as needed (Nausea or vomiting). Patient not taking:  Reported on 02/18/2018 02/17/18   Sindy Guadeloupe, MD  prochlorperazine (COMPAZINE) 25 MG suppository Place 1 suppository (25 mg total) rectally every 12 (twelve) hours as needed for nausea. Patient not taking: Reported on 02/18/2018 02/17/18   Sindy Guadeloupe, MD  SPIRIVA HANDIHALER 18 MCG inhalation capsule Place 1 puff into inhaler and inhale daily. 08/25/17   [provider]  warfarin (COUMADIN) 6 MG tablet Take 1 tablet (6 mg total) by mouth daily at 6 PM. TAKE 10 MG BY MOUTH ON Monday AND 7.5MG  DAILY ALL OTHER DAYS Patient taking differently: Take 5 mg by mouth daily at 6 PM. TAKE 10 MG BY MOUTH ON Monday AND 7.5MG  DAILY ALL OTHER DAYS 09/22/17   [name], MD      PHYSICAL EXAMINATION:   VITAL SIGNS: There were  no vitals taken for this visit.  GENERAL:  57 y.o.-year-old patient lying in the bed with no acute distress.  EYES: Pupils equal, round, reactive to light and accommodation. No scleral icterus. Extraocular muscles intact.  HEENT: Head atraumatic, normocephalic. Oropharynx and nasopharynx clear.  NECK:  Supple, no jugular venous distention. No thyroid enlargement, no tenderness.  LUNGS: Bilateral wheezing throughout both lung CARDIOVASCULAR: S1, S2 normal. No murmurs, rubs, or gallops.  ABDOMEN: Soft, nontender, nondistended. Bowel sounds present. No organomegaly or mass.  EXTREMITIES: 2+ pedal edema, cyanosis, or clubbing.  NEUROLOGIC: Cranial nerves II through XII are intact. Muscle strength 5/5 in all extremities. Sensation intact. Gait not checked.  PSYCHIATRIC: The patient is alert and oriented x 3.  SKIN: No obvious rash, lesion, or ulcer.   LABORATORY PANEL:   CBC Recent Labs  Lab 04/02/18 0821  WBC 18.5*  HGB 9.1*  HCT 27.9*  PLT 600*  MCV 87.1  MCH 28.3  MCHC 32.5  RDW 16.4*  LYMPHSABS 1.2  MONOABS 0.5  EOSABS 0.0  BASOSABS 0.0    ------------------------------------------------------------------------------------------------------------------  Chemistries  Recent Labs  Lab 04/02/18 0821 04/02/18 0920  NA 137  --   K 6.8* 6.7*  CL 100  --   CO2 24  --   GLUCOSE 164*  --   BUN 58*  --   CREATININE 1.02*  --   CALCIUM 9.8  --   AST 27  --   ALT 29  --   ALKPHOS 303*  --   BILITOT 0.4  --    ------------------------------------------------------------------------------------------------------------------ estimated creatinine clearance is 58.8 mL/min (A) (by C-G formula based on SCr of 1.02 mg/dL (H)). ------------------------------------------------------------------------------------------------------------------ No results for input(s): TSH, T4TOTAL, T3FREE, THYROIDAB in the last 72 hours.  Invalid input(s): FREET3   Coagulation profile Recent Labs  Lab 04/02/18 0920  INR 1.95   ------------------------------------------------------------------------------------------------------------------- No results for input(s): DDIMER in the last 72 hours. -------------------------------------------------------------------------------------------------------------------  Cardiac Enzymes No results for input(s): CKMB, TROPONINI, MYOGLOBIN in the last 168 hours.  Invalid input(s): CK ------------------------------------------------------------------------------------------------------------------ Invalid input(s): POCBNP  ---------------------------------------------------------------------------------------------------------------  Urinalysis    Component Value Date/Time   COLORURINE Amber 10/23/2014 1544   APPEARANCEUR Cloudy 10/23/2014 1544   LABSPEC 1.030 10/23/2014 1544   PHURINE 5.0 10/23/2014 1544   GLUCOSEU 150 mg/dL 10/23/2014 1544   HGBUR Negative 10/23/2014 1544   BILIRUBINUR Negative 10/23/2014 1544   KETONESUR Negative 10/23/2014 1544   PROTEINUR >=500 10/23/2014 1544   NITRITE  Negative 10/23/2014 1544   LEUKOCYTESUR Negative 10/23/2014 1544     RADIOLOGY: No results found.  EKG: Orders placed or performed during the hospital encounter of 04/02/18  . EKG 12-Lead  . EKG 12-Lead    IMPRESSION AND PLAN: Patient is 57 year old being admitted with hyperkalemia  1.  Hyperkalemia etiology unclear Patient will be treated with acute hypokalemia protocol Also will start patient on a lowkalmin nephrology will be consulted  2.  COPD with active wheezing we will treat with nebulizers  3.  Significant bilateral lower extremity swelling we will check albumin level give low-dose IV Lasix TED hose  4.  Lung cancer per oncology  5.  Chronic Coumadin therapy pharmacy consult unclear indication    all the records are reviewed and case discussed with ED provider. Management plans discussed with the patient, family and they are in agreement.  CODE STATUS: Code Status History    Date Active Date Inactive Code Status Order ID Comments User Context   09/19/2017 1441 09/22/2017 1906 Full Code  [id number]  Dustin Flock, MD Inpatient       TOTAL TIME TAKING CARE OF THIS PATIENT:55 minutes.    Dustin Flock M.D on 04/02/2018 at 1:27 PM  Between 7am to 6pm - Pager - [phone]  After 6pm go to [email or web address] - password [email or web address] [facility]  [phone]  CC: Primary care physician; Juluis Pitch, MD

## 2018-04-02 NOTE — Progress Notes (Signed)
ANTICOAGULATION CONSULT NOTE - Initial Consult  Pharmacy Consult for Warfarin dosing Indication: left ventricular apical thrombus following MI  Allergies  Allergen Reactions  . Other Shortness Of Breath    Beta blocker     Patient Measurements:   Heparin Dosing Weight:   Vital Signs: Temp: 97.9 F (36.6 C) (09/12 0838) Temp Source: Tympanic (09/12 0838) BP: 107/70 (09/12 0838) Pulse Rate: 117 (09/12 0838)  Labs: Recent Labs    04/02/18 0821 04/02/18 0920  HGB 9.1*  --   HCT 27.9*  --   PLT 600*  --   LABPROT  --  22.1*  INR  --  1.95  CREATININE 1.02*  --     Estimated Creatinine Clearance: 58.8 mL/min (A) (by C-G formula based on SCr of 1.02 mg/dL (H)).   Medical History: Past Medical History:  Diagnosis Date  . Asthma   . CHF (congestive heart failure) ([place])   . COPD (chronic obstructive pulmonary disease) ([place])   . Diabetes mellitus without complication ([place])   . Hypercholesteremia   . Hypertension   . Lung cancer ([place])   . Reflux esophagitis     Medications:  Warfarin 5 mg once daily  Assessment: 57 y.o. Female with history of asthma, CHF, COPD, diabetes type 2, hyperlipidemia, hypertension and diagnosis of lung cancer who was supposed to get her third dose of chemo today.  Blood work showed elevated K and sent to [facility] for admission.  Upon chart review atient has history of left ventricular apical thrombus following MI requiring warfarin.  Per patient she has not taken a dose today. Per patient most recent dose was warfarin 5 mg once daily and according to her resulting in supratherapeutic INR leading recent hold.  At admission INR 1.95  Goal of Therapy:  INR 2-3 Monitor platelets by anticoagulation protocol: Yes   Plan:  Will order warfarin 2.5 mg once tonight.  Will check INR with morning labs to reevaluate dose.  Evelena Asa, PharmD 04/02/2018,2:41 PM

## 2018-04-02 NOTE — Telephone Encounter (Signed)
Called the floor and spoke to [facility] the RN and gave report to her that potassium 6.8 and it was repeated 6.7. Dr. Janese Banks spoke to Dr. Remigio Eisenmenger and willing to take her as pt for inpatient admission. I told her that Dr. Janese Banks wanted me to give her D50 -50 ml and 10 units IV push insulin. It is suppose to drop the potassium quickly but it does not last long. She spoke to [place] about giving pt kayexlate.  On the way taking pt upstairs to the room, pt said that she was hurting in lower stomach and around her back. I asked Velna Hatchet once they notify MD to admit to ask for pain med. And I told that to pt also

## 2018-04-02 NOTE — Consult Note (Signed)
[facility] CONSULT NOTE    Date: 04/02/2018                  Patient Name:  Laura Booth  MRN: [id number]  DOB: 1961-01-25  Age / Sex: 57 y.o., female         PCP: Juluis Pitch, MD                 Service Requesting Consult: Hospitalist                 Reason for Consult: Hyperkalemia.            History of Present Illness: Patient is a 57 y.o. female with a PMHx of left ventricular thrombus with history of anti-coagulation, CHF, COPD, hypertension, diabetes mellitus type 2, tobacco abuse, metastatic lung cancer, who was admitted to [facility] on 04/02/2018 for evaluation of hyperkalemia.  The patient has been undergoing chemotherapy with carboplatinum, Alimta, and Keytruda for metastatic lung cancer.  On March 12, 2018 serum potassium was a bit high at 5.4.  Earlier today at 40 this morning serum potassium was quite high at 6.8.  This was repeated at 920 and came back at 6.7.  Of note Beryle Flock is associated with hyperkalemia at times.  The patient also appears to have acute renal failure as her BUN was noted to be 58 with a creatinine of 1.02.  Her baseline BUN and creatinine are 16 and 0.62 respectively.  Patient denies eating a significant amount of bananas, oranges, or potatoes recently.   Medications: Outpatient medications: Medications Prior to Admission  Medication Sig Dispense Refill Last Dose  . acetaminophen (TYLENOL) 500 MG tablet Take 1,000 mg by mouth every 6 (six) hours as needed.   Taking  . ADVAIR DISKUS 500-50 MCG/DOSE AEPB Inhale 1 puff into the lungs every 12 (twelve) hours.  0 Taking  . atorvastatin (LIPITOR) 40 MG tablet Take 40 mg by mouth daily.    Taking  . dexamethasone (DECADRON) 4 MG tablet Take 1 tab two times a day the day before Alimta chemo. Take 2 tabs two times a day starting the day after chemo for 3 days. (Patient not taking: Reported on 02/18/2018) 30 tablet 1 Not Taking  . fexofenadine (ALLEGRA) 180 MG tablet Take 180 mg by  mouth daily.   Taking  . folic acid (FOLVITE) 1 MG tablet Take 1 tablet (1 mg total) by mouth daily. (Patient not taking: Reported on 02/18/2018) 30 tablet 6 Not Taking  . furosemide (LASIX) 20 MG tablet Take 20 mg by mouth. PRN   Taking  . guaiFENesin-dextromethorphan (ROBITUSSIN DM) 100-10 MG/5ML syrup Take 15 mLs by mouth every 4 (four) hours as needed for cough. (Patient not taking: Reported on 04/02/2018) 118 mL 0 Not Taking  . ipratropium-albuterol (DUONEB) 0.5-2.5 (3) MG/3ML SOLN Take 3 mLs by nebulization every 4 (four) hours. 360 mL 0 Taking  . lidocaine-prilocaine (EMLA) cream Apply to affected area once (Patient not taking: Reported on 02/18/2018) 30 g 3 Not Taking  . LORazepam (ATIVAN) 0.5 MG tablet Take 1 tablet (0.5 mg total) by mouth every 6 (six) hours as needed (Nausea or vomiting). (Patient not taking: Reported on 02/18/2018) 30 tablet 0 Not Taking  . magic mouthwash SOLN Take 10 mLs by mouth 3 (three) times daily. 300 mL 0 Taking  . metFORMIN (GLUCOPHAGE-XR) 750 MG 24 hr tablet Take 1 tablet by mouth 2 (two) times daily.  3 Taking  . montelukast (SINGULAIR) 10 MG tablet  Take 10 mg by mouth daily.  3 Taking  . nicotine (NICODERM CQ - DOSED IN MG/24 HOURS) 21 mg/24hr patch Place 1 patch (21 mg total) onto the skin daily. (Patient not taking: Reported on 03/03/2018) 28 patch 0 Not Taking  . omeprazole (PRILOSEC) 20 MG capsule Take 20 mg by mouth daily.   Taking  . ondansetron (ZOFRAN) 8 MG tablet Take 1 tablet (8 mg total) by mouth every 8 (eight) hours as needed. (Patient not taking: Reported on 02/18/2018) 30 tablet 1 Not Taking  . oxyCODONE (OXY IR/ROXICODONE) 5 MG immediate release tablet Take 1 tablet (5 mg total) by mouth every 6 (six) hours as needed for severe pain. 60 tablet 0 Taking  . polyethylene glycol powder (GLYCOLAX/MIRALAX) powder Take 17 g by mouth daily. (Patient not taking: Reported on 04/02/2018) 255 g 1 Not Taking  . predniSONE (DELTASONE) 10 MG tablet 60 mg X1 followed  by 50 mg X1, 40 mg X1, 30 mg X1, 20 mg X1, 10 mg X1 then stop 21 tablet 0 Taking  . PROAIR HFA 108 (90 Base) MCG/ACT inhaler Inhale 2 puffs into the lungs 4 (four) times daily as needed. (Patient not taking: Reported on 03/03/2018) 18 g 1 Not Taking  . prochlorperazine (COMPAZINE) 10 MG tablet Take 1 tablet (10 mg total) by mouth every 6 (six) hours as needed (Nausea or vomiting). (Patient not taking: Reported on 02/18/2018) 30 tablet 1 Not Taking  . prochlorperazine (COMPAZINE) 25 MG suppository Place 1 suppository (25 mg total) rectally every 12 (twelve) hours as needed for nausea. (Patient not taking: Reported on 02/18/2018) 12 suppository 3 Not Taking  . SPIRIVA HANDIHALER 18 MCG inhalation capsule Place 1 puff into inhaler and inhale daily.  0 Taking  . warfarin (COUMADIN) 6 MG tablet Take 1 tablet (6 mg total) by mouth daily at 6 PM. TAKE 10 MG BY MOUTH ON Monday AND 7.5MG  DAILY ALL OTHER DAYS (Patient taking differently: Take 5 mg by mouth daily at 6 PM. TAKE 10 MG BY MOUTH ON Monday AND 7.5MG  DAILY ALL OTHER DAYS) 30 tablet 0 Taking    Current medications: Current Facility-Administered Medications  Medication Dose Route Frequency Provider Last Rate Last Dose  . 0.9 %  sodium chloride infusion  250 mL Intravenous PRN Khelani Kops, MD      . acetaminophen (TYLENOL) tablet 650 mg  650 mg Oral Q6H PRN Dustin Flock, MD       Or  . acetaminophen (TYLENOL) suppository 650 mg  650 mg Rectal Q6H PRN Dustin Flock, MD      . albuterol (PROVENTIL) (2.5 MG/3ML) 0.083% nebulizer solution 10 mg  10 mg Nebulization Once Dustin Flock, MD      . atorvastatin (LIPITOR) tablet 40 mg  40 mg Oral q1800 Dustin Flock, MD   40 mg at 04/02/18 1621  . furosemide (LASIX) injection 20 mg  20 mg Intravenous Q12H Dustin Flock, MD   20 mg at 04/02/18 1409  . ipratropium-albuterol (DUONEB) 0.5-2.5 (3) MG/3ML nebulizer solution 3 mL  3 mL Nebulization Q4H Dustin Flock, MD      . loratadine (CLARITIN)  tablet 10 mg  10 mg Oral Daily Dustin Flock, MD   10 mg at 04/02/18 1544  . mometasone-formoterol (DULERA) 200-5 MCG/ACT inhaler 2 puff  2 puff Inhalation BID Dustin Flock, MD      . ondansetron [facility]) tablet 4 mg  4 mg Oral Q6H PRN Dustin Flock, MD       Or  .  ondansetron (ZOFRAN) injection 4 mg  4 mg Intravenous Q6H PRN Dustin Flock, MD      . oxyCODONE (Oxy IR/ROXICODONE) immediate release tablet 5 mg  5 mg Oral Q6H PRN Dustin Flock, MD   5 mg at 04/02/18 1409  . oxyCODONE (OXYCONTIN) 12 hr tablet 10 mg  10 mg Oral Q12H Dustin Flock, MD   10 mg at 04/02/18 1544  . pantoprazole (PROTONIX) EC tablet 40 mg  40 mg Oral Daily Dustin Flock, MD      . sodium chloride flush (NS) 0.9 % injection 3 mL  3 mL Intravenous Q12H Dustin Flock, MD   3 mL at 04/02/18 1547  . sodium chloride flush (NS) 0.9 % injection 3 mL  3 mL Intravenous PRN Dustin Flock, MD      . sodium zirconium cyclosilicate (LOKELMA) packet 10 g  10 g Oral TID Cloris Flippo, MD      . tiotropium (SPIRIVA) inhalation capsule 18 mcg  18 mcg Inhalation Daily Dustin Flock, MD      . warfarin (COUMADIN) tablet 2.5 mg  2.5 mg Oral ONCE-1800 Dustin Flock, MD      . Warfarin - Pharmacist Dosing Inpatient   Does not apply [id number] Dustin Flock, MD       Facility-Administered Medications Ordered in Other Encounters  Medication Dose Route Frequency Provider Last Rate Last Dose  . heparin lock flush 100 unit/mL  500 Units Intravenous Once Sindy Guadeloupe, MD      . sodium chloride flush (NS) 0.9 % injection 10 mL  10 mL Intravenous PRN Sindy Guadeloupe, MD   10 mL at 04/02/18 3875  . sodium polystyrene (KAYEXALATE) 15 GM/60ML suspension 30 g  30 g Oral Once Sindy Guadeloupe, MD          Allergies: Allergies  Allergen Reactions  . Other Shortness Of Breath    Beta blocker       Past Medical History: Past Medical History:  Diagnosis Date  . Asthma   . CHF (congestive heart failure) ([place])   . COPD  (chronic obstructive pulmonary disease) ([place])   . Diabetes mellitus without complication ([place])   . Hypercholesteremia   . Hypertension   . Lung cancer ([place])   . Reflux esophagitis      Past Surgical History: Past Surgical History:  Procedure Laterality Date  . IR IMAGING GUIDED PORT INSERTION  02/11/2018     Family History: Family History  Problem Relation Age of Onset  . Diabetes Mother      Social History: Social History   Socioeconomic History  . Marital status: Single    Spouse name: Not on file  . Number of children: Not on file  . Years of education: Not on file  . Highest education level: Not on file  Occupational History  . Not on file  Social Needs  . Financial resource strain: Not hard at all  . Food insecurity:    Worry: Never true    Inability: Never true  . Transportation needs:    Medical: No    Non-medical: No  Tobacco Use  . Smoking status: Former Smoker    Last attempt to quit: 01/12/2018    Years since quitting: 0.2  . Smokeless tobacco: Never Used  Substance and Sexual Activity  . Alcohol use: Yes    Frequency: Never    Comment: socially  . Drug use: No  . Sexual activity: Not Currently    Birth control/protection: Abstinence  Lifestyle  .  Physical activity:    Days per week: 0 days    Minutes per session: 0 min  . Stress: Not on file  Relationships  . Social connections:    Talks on phone: More than three times a week    Gets together: More than three times a week    Attends religious service: Patient refused    Active member of club or organization: No    Attends meetings of clubs or organizations: Never    Relationship status: Never married  . Intimate partner violence:    Fear of current or ex partner: No    Emotionally abused: No    Physically abused: No    Forced sexual activity: No  Other Topics Concern  . Not on file  Social History Narrative  . Not on file     Review of Systems: Review of Systems   Constitutional: Positive for malaise/fatigue. Negative for chills and fever.  HENT: Negative for congestion, hearing loss and tinnitus.   Eyes: Negative for blurred vision and double vision.  Respiratory: Positive for cough and shortness of breath.   Cardiovascular: Negative for chest pain and palpitations.  Gastrointestinal: Positive for nausea. Negative for diarrhea and heartburn.  Genitourinary: Negative for dysuria, frequency and urgency.  Musculoskeletal: Positive for joint pain. Negative for myalgias.  Skin: Negative for itching and rash.  Neurological: Negative for dizziness and focal weakness.  Endo/Heme/Allergies: Negative for polydipsia. Does not bruise/bleed easily.  Psychiatric/Behavioral: Negative for depression. The patient is not nervous/anxious.      Vital Signs: There were no vitals taken for this visit.  Weight trends: There were no vitals filed for this visit.  Physical Exam: General: NAD, resting comfortably in bed  Head: Normocephalic, atraumatic.  Eyes: Anicteric, EOMI  Nose: Mucous membranes moist, not inflammed, nonerythematous.  Throat: Oropharynx nonerythematous, no exudate appreciated.   Neck: Supple, trachea midline.  Lungs:  Scattered rhonchi and wheezes  Heart: S1S2 no rubs  Abdomen:  BS normoactive. Soft, Nondistended, non-tender.  No masses or organomegaly.  Extremities: 2+ pretibial edema.  Neurologic: A&O X3, Motor strength is 5/5 in the all 4 extremities  Skin: No visible rashes, scars.    Lab results: Basic Metabolic Panel: Recent Labs  Lab 04/02/18 0821 04/02/18 0920  NA 137  --   K 6.8* 6.7*  CL 100  --   CO2 24  --   GLUCOSE 164*  --   BUN 58*  --   CREATININE 1.02*  --   CALCIUM 9.8  --     Liver Function Tests: Recent Labs  Lab 04/02/18 0821  AST 27  ALT 29  ALKPHOS 303*  BILITOT 0.4  PROT 8.1  ALBUMIN 2.5*   No results for input(s): LIPASE, AMYLASE in the last 168 hours. No results for input(s): AMMONIA in the  last 168 hours.  CBC: Recent Labs  Lab 04/02/18 0821  WBC 18.5*  NEUTROABS 16.9*  HGB 9.1*  HCT 27.9*  MCV 87.1  PLT 600*    Cardiac Enzymes: No results for input(s): CKTOTAL, CKMB, CKMBINDEX, TROPONINI in the last 168 hours.  BNP: Invalid input(s): POCBNP  CBG: No results for input(s): GLUCAP in the last 168 hours.  Microbiology: Results for orders placed or performed during the hospital encounter of 09/19/17  Blood culture (routine x 2)     Status: None   Collection Time: 09/19/17 11:14 AM  Result Value Ref Range Status   Specimen Description BLOOD RIGHT ANTECUBITAL  Final   Special  Requests   Final    BOTTLES DRAWN AEROBIC AND ANAEROBIC Blood Culture results may not be optimal due to an excessive volume of blood received in culture bottles   Culture   Final    NO GROWTH 5 DAYS Performed at [facility], [address] [place], [place] [address]    Report Status 09/24/2017 FINAL  Final  Blood culture (routine x 2)     Status: None   Collection Time: 09/19/17 11:14 AM  Result Value Ref Range Status   Specimen Description BLOOD BLOOD RIGHT HAND  Final   Special Requests   Final    BOTTLES DRAWN AEROBIC AND ANAEROBIC Blood Culture results may not be optimal due to an excessive volume of blood received in culture bottles   Culture   Final    NO GROWTH 5 DAYS Performed at [facility], [address], [place], [place] [address]    Report Status 09/24/2017 FINAL  Final    Coagulation Studies: Recent Labs    04/02/18 0920  LABPROT 22.1*  INR 1.95    Urinalysis: No results for input(s): COLORURINE, LABSPEC, PHURINE, GLUCOSEU, HGBUR, BILIRUBINUR, KETONESUR, PROTEINUR, UROBILINOGEN, NITRITE, LEUKOCYTESUR in the last 72 hours.  Invalid input(s): APPERANCEUR    Imaging: Dg Chest Port 1 View  Result Date: 04/02/2018 CLINICAL DATA:  Shortness of breath. EXAM: PORTABLE CHEST 1 VIEW COMPARISON:  Radiographs of September 22, 2017. PET scan of January 28, 2018. FINDINGS: The heart size and mediastinal contours are within normal limits. Interval placement of right sided Port-A-Cath with distal tip in expected position of cavoatrial junction. No pneumothorax or pleural effusion is noted. Right lung is unremarkable. Ill-defined opacity is seen laterally in the left upper lobe consistent with neoplasm as described on prior CT scan. New nodular density seen in left lower lobe also concerning for possible neoplasm. The visualized skeletal structures are unremarkable. IMPRESSION: Left upper lobe density is noted consistent with history of malignancy. New nodular density is seen in left lower lobe which may represent metastatic disease. CT scan may be performed for further evaluation. Electronically Signed   By: Marijo Conception, M.D.   On: 04/02/2018 13:29      Assessment & Plan: Pt is a 57 y.o. female with a PMHx of left ventricular thrombus with history of anti-coagulation, CHF, COPD, hypertension, diabetes mellitus type 2, tobacco abuse, metastatic lung cancer, who was admitted to [facility] on 04/02/2018 for evaluation of hyperkalemia.   1.  Acute renal failure, likely related to chemotherapy, poor PO intake. 2.  Hyperkalemia, suspect related to [place].  3.  Anemia unspecified, likely related to chemo/malginancy. 4.  Hypertension.   Plan:  The patient presents with a very interesting case.  She is here now with severe hyperkalemia as well as acute renal failure.  We will start the patient on IV fluid hydration with 0.9 normal saline.  In addition she will need treatment for her hyperkalemia.  We have started her on sodium zirconium 10 g p.o. 3 times daily.  We will monitor serum potassium every 6 hours.  If potassium continues to go up we may need to consider renal placement therapy and this was discussed in depth with the patient.  Follow renal function as well.  Check renal ultrasound to make sure there is no underlying obstruction.  Thanks for  consultation.

## 2018-04-03 LAB — BASIC METABOLIC PANEL
Anion gap: 10 (ref 5–15)
BUN: 37 mg/dL — AB (ref 6–20)
CALCIUM: 10.1 mg/dL (ref 8.9–10.3)
CO2: 30 mmol/L (ref 22–32)
Chloride: 103 mmol/L (ref 98–111)
Creatinine, Ser: 0.61 mg/dL (ref 0.44–1.00)
GFR calc Af Amer: >60 mL/min (ref >60)
GFR calc non Af Amer: >60 mL/min (ref >60)
GLUCOSE: 126 mg/dL — AB (ref 70–99)
POTASSIUM: 5.9 mmol/L — AB (ref 3.5–5.1)
SODIUM: 143 mmol/L (ref 135–145)

## 2018-04-03 LAB — CBC
HCT: 28.8 % — ABNORMAL LOW (ref 35.0–47.0)
Hemoglobin: 9.3 g/dL — ABNORMAL LOW (ref 12.0–16.0)
MCH: 28 pg (ref 26.0–34.0)
MCHC: 32.4 g/dL (ref 32.0–36.0)
MCV: 86.6 fL (ref 80.0–100.0)
PLATELETS: 554 10*3/uL — AB (ref 150–440)
RBC: 3.33 MIL/uL — AB (ref 3.80–5.20)
RDW: 17 % — AB (ref 11.5–14.5)
WBC: 20.6 10*3/uL — ABNORMAL HIGH (ref 3.6–11.0)

## 2018-04-03 LAB — NA AND K (SODIUM & POTASSIUM), RAND UR
Potassium Urine: 12 mmol/L
SODIUM UR: 25 mmol/L

## 2018-04-03 LAB — OSMOLALITY, URINE: Osmolality, Ur: 265 mOsm/kg — ABNORMAL LOW (ref 300–900)

## 2018-04-03 LAB — PROTIME-INR
INR: 1.71
PROTHROMBIN TIME: 19.9 s — AB (ref 11.4–15.2)

## 2018-04-03 MED ORDER — MONTELUKAST SODIUM 10 MG PO TABS
10.0000 mg | ORAL_TABLET | Freq: Every day | ORAL | Status: DC
  Administered 2018-04-03 – 2018-04-06 (×4): 10 mg via ORAL
  Filled 2018-04-03 (×4): qty 1

## 2018-04-03 MED ORDER — WARFARIN SODIUM 2.5 MG PO TABS
2.5000 mg | ORAL_TABLET | Freq: Once | ORAL | Status: AC
  Administered 2018-04-03: 2.5 mg via ORAL
  Filled 2018-04-03: qty 1

## 2018-04-03 MED ORDER — WARFARIN SODIUM 5 MG PO TABS: 5.0000 mg | ORAL_TABLET | Freq: Once | ORAL | Status: DC

## 2018-04-03 MED ORDER — SODIUM CHLORIDE 0.9 % IV SOLN
INTRAVENOUS | Status: DC
  Administered 2018-04-03 (×2): via INTRAVENOUS

## 2018-04-03 MED ORDER — NEPRO/CARBSTEADY PO LIQD
237.0000 mL | Freq: Three times a day (TID) | ORAL | Status: DC
  Administered 2018-04-03 – 2018-04-06 (×8): 237 mL via ORAL

## 2018-04-03 NOTE — Progress Notes (Signed)
[facility] NAME: Laura Booth    MR#:  [id number]  DATE OF BIRTH:  Mar 27, 1961  SUBJECTIVE:   Patient presented with hyperkalemia.  Potassium level has improved, renal function is also improved.  Hyperkalemia is thought to be secondary to her immunotherapy Keytruda.  Patient still complaining of some specific pain which is chronic for her.  No other acute events overnight.  REVIEW OF SYSTEMS:    Review of Systems  Constitutional: Negative for chills and fever.  HENT: Negative for congestion and tinnitus.   Eyes: Negative for blurred vision and double vision.  Respiratory: Negative for cough, shortness of breath and wheezing.   Cardiovascular: Negative for chest pain, orthopnea and PND.  Gastrointestinal: Negative for abdominal pain, diarrhea, nausea and vomiting.  Genitourinary: Negative for dysuria and hematuria.  Neurological: Negative for dizziness, sensory change and focal weakness.  All other systems reviewed and are negative.   Nutrition: Heart Healthy/Carb control Tolerating Diet: Yes Tolerating PT: Ambulatory  DRUG ALLERGIES:   Allergies  Allergen Reactions  . Other Shortness Of Breath    Beta blocker     VITALS:  Blood pressure 109/72, pulse (!) 125, temperature 98.4 F (36.9 C), temperature source Oral, resp. rate 20, height 5\' 4"  (1.626 m), weight 72.9 kg, SpO2 100 %.  PHYSICAL EXAMINATION:   Physical Exam  GENERAL:  57 y.o.-year-old patient lying in bed in no acute distress.  EYES: Pupils equal, round, reactive to light and accommodation. No scleral icterus. Extraocular muscles intact.  HEENT: Head atraumatic, normocephalic. Oropharynx and nasopharynx clear.  NECK:  Supple, no jugular venous distention. No thyroid enlargement, no tenderness.  LUNGS: Normal breath sounds bilaterally, no wheezing, rales, rhonchi. No use of accessory muscles of respiration.  CARDIOVASCULAR: S1, S2 normal. No murmurs, rubs,  or gallops.  ABDOMEN: Soft, nontender, nondistended. Bowel sounds present. No organomegaly or mass.  EXTREMITIES: No cyanosis, clubbing or edema b/l.    NEUROLOGIC: Cranial nerves II through XII are intact. No focal Motor or sensory deficits b/l.   PSYCHIATRIC: The patient is alert and oriented x 3.  SKIN: No obvious rash, lesion, or ulcer.    LABORATORY PANEL:   CBC Recent Labs  Lab 04/03/18 0050  WBC 20.6*  HGB 9.3*  HCT 28.8*  PLT 554*   ------------------------------------------------------------------------------------------------------------------  Chemistries  Recent Labs  Lab 04/02/18 0821  04/03/18 0050  NA 137  --  143  K 6.8*   < > 5.9*  CL 100  --  103  CO2 24  --  30  GLUCOSE 164*  --  126*  BUN 58*  --  37*  CREATININE 1.02*  --  0.61  CALCIUM 9.8  --  10.1  AST 27  --   --   ALT 29  --   --   ALKPHOS 303*  --   --   BILITOT 0.4  --   --    < > = values in this interval not displayed.   ------------------------------------------------------------------------------------------------------------------  Cardiac Enzymes No results for input(s): TROPONINI in the last 168 hours. ------------------------------------------------------------------------------------------------------------------  RADIOLOGY:  Dg Chest Port 1 View  Result Date: 04/02/2018 CLINICAL DATA:  Shortness of breath. EXAM: PORTABLE CHEST 1 VIEW COMPARISON:  Radiographs of September 22, 2017. PET scan of January 28, 2018. FINDINGS: The heart size and mediastinal contours are within normal limits. Interval placement of right sided Port-A-Cath with distal tip in expected position of cavoatrial junction. No pneumothorax or pleural  effusion is noted. Right lung is unremarkable. Ill-defined opacity is seen laterally in the left upper lobe consistent with neoplasm as described on prior CT scan. New nodular density seen in left lower lobe also concerning for possible neoplasm. The visualized skeletal  structures are unremarkable. IMPRESSION: Left upper lobe density is noted consistent with history of malignancy. New nodular density is seen in left lower lobe which may represent metastatic disease. CT scan may be performed for further evaluation. Electronically Signed   By: Marijo Conception, M.D.   On: 04/02/2018 13:29     ASSESSMENT AND PLAN:   57 year old female with past medical history of lung cancer, COPD, hypertension, hyperlipidemia, diabetes who presents to the hospital due to severe hyperkalemia  1.  Hyperkalemia- patient presented to the hospital with a potassium of 6.7.   thought to be secondary to acute kidney injury along with patient being on Keytruda. - appreciate nephro input and cont. Lokelma.  Potasium level down to 5.9 today.  - cont. Fluids, Lokelma for now.   2. ARF - due to dehydration.  Much improved with IV fluids and will continue to monitor. -Appreciate nephrology input.  3.  History of left ventricular thrombus-continue Coumadin.  4.  COPD-no acute exacerbation, continue Dulera, Spiriva, duo nebs.  5.  Hyperlipidemia-continue atorvastatin.  6.  GERD-continue Protonix.  7. Hx of Lung Cancer - follows with Dr. Janese Banks. Currently on Immunotherapy with Keytruda.  - follow up with them as outpatient.   Possible d/c home tomorrow if Potassium level is trending down.   All the records are reviewed and case discussed with Care Management/Social Worker. Management plans discussed with the patient, family and they are in agreement.  CODE STATUS: Full code  DVT Prophylaxis: Coumadin  TOTAL TIME TAKING CARE OF THIS PATIENT: 30 minutes.   POSSIBLE D/C IN 1-2 DAYS, DEPENDING ON CLINICAL CONDITION.   Henreitta Leber M.D on 04/03/2018 at 1:36 PM  Between 7am to 6pm - Pager - [phone]  After 6pm go to [email or web address] - Proofreader  [facility]  Office  [phone]  CC: Primary care physician; Juluis Pitch, MD

## 2018-04-03 NOTE — Progress Notes (Signed)
[facility]  ROUNDING NOTE   Subjective:  Patient seen at bedside. Serum potassium down to 5.9. Renal function also improved.  Objective:  Vital signs in last 24 hours:  Temp:  [97.8 F (36.6 C)-98.3 F (36.8 C)] 97.8 F (36.6 C) (09/13 0423) Pulse Rate:  [116-126] 124 (09/13 0423) Resp:  [18-20] 20 (09/13 0423) BP: (110-122)/(69-74) 122/74 (09/13 0423) SpO2:  [92 %-97 %] 97 % (09/13 0423) Weight:  [72.9 kg] 72.9 kg (09/13 0017)  Weight change:  Filed Weights   04/03/18 0017  Weight: 72.9 kg    Intake/Output: No intake/output data recorded.   Intake/Output this shift:  No intake/output data recorded.  Physical Exam: General: No acute distress  Head: Normocephalic, atraumatic. Moist oral mucosal membranes  Eyes: Anicteric  Neck: Supple, trachea midline  Lungs:  Clear to auscultation, normal effort  Heart: S1S2 tachycardic  Abdomen:  Soft, nontender, bowel sounds present  Extremities: 2+ peripheral edema.  Neurologic: Awake, alert, following commands  Skin: No lesions       Basic Metabolic Panel: Recent Labs  Lab 04/02/18 0821 04/02/18 0920 04/02/18 1536 04/02/18 1855 04/03/18 0050  NA 137  --   --   --  143  K 6.8* 6.7* 6.1* 5.9* 5.9*  CL 100  --   --   --  103  CO2 24  --   --   --  30  GLUCOSE 164*  --   --   --  126*  BUN 58*  --   --   --  37*  CREATININE 1.02*  --   --   --  0.61  CALCIUM 9.8  --   --   --  10.1    Liver Function Tests: Recent Labs  Lab 04/02/18 0821  AST 27  ALT 29  ALKPHOS 303*  BILITOT 0.4  PROT 8.1  ALBUMIN 2.5*   No results for input(s): LIPASE, AMYLASE in the last 168 hours. No results for input(s): AMMONIA in the last 168 hours.  CBC: Recent Labs  Lab 04/02/18 0821 04/03/18 0050  WBC 18.5* 20.6*  NEUTROABS 16.9*  --   HGB 9.1* 9.3*  HCT 27.9* 28.8*  MCV 87.1 86.6  PLT 600* 554*    Cardiac Enzymes: No results for input(s): CKTOTAL, CKMB, CKMBINDEX, TROPONINI in the last 168  hours.  BNP: Invalid input(s): POCBNP  CBG: No results for input(s): GLUCAP in the last 168 hours.  Microbiology: Results for orders placed or performed during the hospital encounter of 09/19/17  Blood culture (routine x 2)     Status: None   Collection Time: 09/19/17 11:14 AM  Result Value Ref Range Status   Specimen Description BLOOD RIGHT ANTECUBITAL  Final   Special Requests   Final    BOTTLES DRAWN AEROBIC AND ANAEROBIC Blood Culture results may not be optimal due to an excessive volume of blood received in culture bottles   Culture   Final    NO GROWTH 5 DAYS Performed at [facility], [address] [place], [place] [address]    Report Status 09/24/2017 FINAL  Final  Blood culture (routine x 2)     Status: None   Collection Time: 09/19/17 11:14 AM  Result Value Ref Range Status   Specimen Description BLOOD BLOOD RIGHT HAND  Final   Special Requests   Final    BOTTLES DRAWN AEROBIC AND ANAEROBIC Blood Culture results may not be optimal due to an excessive volume of blood received in  culture bottles   Culture   Final    NO GROWTH 5 DAYS Performed at [facility], [address] [place], [place] [address]    Report Status 09/24/2017 FINAL  Final    Coagulation Studies: Recent Labs    04/02/18 0920 04/03/18 0050  LABPROT 22.1* 19.9*  INR 1.95 1.71    Urinalysis: No results for input(s): COLORURINE, LABSPEC, PHURINE, GLUCOSEU, HGBUR, BILIRUBINUR, KETONESUR, PROTEINUR, UROBILINOGEN, NITRITE, LEUKOCYTESUR in the last 72 hours.  Invalid input(s): APPERANCEUR    Imaging: Dg Chest Port 1 View  Result Date: 04/02/2018 CLINICAL DATA:  Shortness of breath. EXAM: PORTABLE CHEST 1 VIEW COMPARISON:  Radiographs of September 22, 2017. PET scan of January 28, 2018. FINDINGS: The heart size and mediastinal contours are within normal limits. Interval placement of right sided Port-A-Cath with distal tip in expected position of cavoatrial junction. No pneumothorax  or pleural effusion is noted. Right lung is unremarkable. Ill-defined opacity is seen laterally in the left upper lobe consistent with neoplasm as described on prior CT scan. New nodular density seen in left lower lobe also concerning for possible neoplasm. The visualized skeletal structures are unremarkable. IMPRESSION: Left upper lobe density is noted consistent with history of malignancy. New nodular density is seen in left lower lobe which may represent metastatic disease. CT scan may be performed for further evaluation. Electronically Signed   By: Marijo Conception, M.D.   On: 04/02/2018 13:29     Medications:   . sodium chloride     . albuterol  10 mg Nebulization Once  . atorvastatin  40 mg Oral q1800  . furosemide  20 mg Intravenous Q12H  . ipratropium-albuterol  3 mL Nebulization Q4H  . loratadine  10 mg Oral Daily  . mometasone-formoterol  2 puff Inhalation BID  . montelukast  10 mg Oral Daily  . oxyCODONE  10 mg Oral Q12H  . pantoprazole  40 mg Oral Daily  . sodium zirconium cyclosilicate  10 g Oral TID  . tiotropium  18 mcg Inhalation Daily  . warfarin  2.5 mg Oral ONCE-1800  . Warfarin - Pharmacist Dosing Inpatient   Does not apply q1800   acetaminophen **OR** acetaminophen, ondansetron **OR** ondansetron (ZOFRAN) IV, oxyCODONE  Assessment/ Plan:  57 y.o. African American female with a PMHx of left ventricular thrombus with history of anti-coagulation, CHF, COPD, hypertension, diabetes mellitus type 2, tobacco abuse, metastatic lung cancer, who was admitted to [facility] on 04/02/2018 for evaluation of hyperkalemia.   1.  Acute renal failure, likely related to chemotherapy, poor PO intake. 2.  Hyperkalemia, suspect related to [place].  3.  Anemia unspecified, likely related to chemo/malginancy. 4.  Hypertension.   Plan:  Renal function has improved a bit.  BUN down to 37 with a creatinine of 0.61.  Continue IV fluid hydration.  Serum potassium has come down to 5.9.  Continue  sodium zirconium 10 g p.o. 3 times daily for another 24 hours.  We will continue to monitor serum potassium closely.  Otherwise additional treatment as per hospitalist.  LOS: 1 Bryonna Sundby 9/13/20199:17 AM

## 2018-04-03 NOTE — Progress Notes (Addendum)
ANTICOAGULATION CONSULT NOTE - Initial Consult  Pharmacy Consult for Warfarin dosing Indication: left ventricular apical thrombus following MI  Allergies  Allergen Reactions  . Other Shortness Of Breath    Beta blocker     Patient Measurements: Height: 5\' 4"  (162.6 cm) Weight: 160 lb 11.5 oz (72.9 kg) IBW/kg (Calculated) : 54.7 Heparin Dosing Weight:   Vital Signs: Temp: 97.8 F (36.6 C) (09/13 0423) Temp Source: Oral (09/13 0423) BP: 122/74 (09/13 0423) Pulse Rate: 124 (09/13 0423)  Labs: Recent Labs    04/02/18 0821 04/02/18 0920 04/03/18 0050  HGB 9.1*  --  9.3*  HCT 27.9*  --  28.8*  PLT 600*  --  554*  LABPROT  --  22.1* 19.9*  INR  --  1.95 1.71  CREATININE 1.02*  --  0.61    Estimated Creatinine Clearance: 75 mL/min (by C-G formula based on SCr of 0.61 mg/dL).   Medical History: Past Medical History:  Diagnosis Date  . Asthma   . CHF (congestive heart failure) ([place])   . COPD (chronic obstructive pulmonary disease) ([place])   . Diabetes mellitus without complication ([place])   . Hypercholesteremia   . Hypertension   . Lung cancer ([place])   . Reflux esophagitis     Medications:  Warfarin 5 mg once daily  Assessment: 57 y.o. Female with history of asthma, CHF, COPD, diabetes type 2, hyperlipidemia, hypertension and diagnosis of lung cancer who was supposed to get her third dose of chemo today.  Blood work showed elevated K and sent to [facility] for admission.  Upon chart review patient has history of left ventricular apical thrombus following MI requiring warfarin.  Per patient she did not take a dose PTA on 09/12. Per patient most recent dose was warfarin 5 mg once daily and according to her resulting in supratherapeutic INR leading recent hold. I did find an INR>10 for 09/04 and she reports taking no doses since that reading. She was admitted here early in March with a supratherapeutic INR but her dose was a 7.5/10 mg combination. She did not get any doses here to  evaluate her response. There are no new significant DDIs.                INR      Warfarin 9/12   1.95         2.5 9/13   1.71         2.5  Goal of Therapy:  INR 2-3 Monitor platelets by anticoagulation protocol: Yes   Plan:  Will order warfarin 2.5 mg once tonight.  Will check INR with morning labs to reevaluate dose.   Laura Booth, PharmD 04/03/2018,7:42 AM

## 2018-04-03 NOTE — Progress Notes (Signed)
Initial Nutrition Assessment  DOCUMENTATION CODES:   Not applicable  INTERVENTION:   -Add potassium restriction to heart healthy, carb modified diet -Nepro Shake po TID, each supplement provides 425 kcal and 19 grams protein  NUTRITION DIAGNOSIS:   Increased nutrient needs related to cancer and cancer related treatments as evidenced by estimated needs.  GOAL:   Patient will meet greater than or equal to 90% of their needs  MONITOR:   PO intake, Supplement acceptance, Labs, Weight trends, Skin, I & O's  REASON FOR ASSESSMENT:   Malnutrition Screening Tool    ASSESSMENT:   Laura Booth  is a 57 y.o. female with a known history of asthma, CHF, COPD, diabetes type 2, hyperlipidemia, hypertension and diagnosis of lung cancer who was supposed to get her third dose of chemo today.  When she had blood work checked showed that her potassium was very high therefore she is sent for admission.    Pt admitted with hyperkalemia, AKI due to chemotherapy.   Spoke with pt at bedside, who reports a decreased appetite related to early satiety over the past 3 months, which she attributes to chemotherapy sides effects. Pt shares she tries to eat several times per day in order to get nutrition; she has incorporate snacks and consumes 3 meals per day, but often only eats small amounts due ot getting full very quickly. She also consumes a combination of 3 Premier Protein and Ensure Plus supplements daily. Per pt, she often mixes ice cream with the supplements for better flavor.   Pt reports UBW is around 185#m but unsure of when the last time she weighed this amount. Pt has experienced a 2.1% wt loss over the past month, which is not significant for time frame.   Pt denies any changes in mobility, however, reports she gets more fatigued easily.   Discussed importance of good meal and supplement intake to promote healing. Pt amenable to try supplements during hospitalization (will order Nepro due to  high K levels and pt's preference of butter pecan flavor). Pt amenable to continue supplements at home to optimize nutritional intake.   No recent Hgb A1c available for assessment (Hgb A1c: 8.0 in 2014). PTA DM medications are 750 mg metformin daily.   Labs reviewed: K: 5.9 (on sodium zirconium cyclosilicate)   NUTRITION - FOCUSED PHYSICAL EXAM:    Most Recent Value  Orbital Region  No depletion  Upper Arm Region  Mild depletion  Thoracic and Lumbar Region  No depletion  Buccal Region  No depletion  Temple Region  No depletion  Clavicle Bone Region  No depletion  Clavicle and Acromion Bone Region  No depletion  Scapular Bone Region  No depletion  Dorsal Hand  No depletion  Patellar Region  No depletion  Anterior Thigh Region  No depletion  Posterior Calf Region  No depletion  Edema (RD Assessment)  Mild  Hair  Reviewed  Eyes  Reviewed  Mouth  Reviewed  Skin  Reviewed  Nails  Reviewed       Diet Order:   Diet Order            Diet heart healthy/carb modified Room service appropriate? Yes; Fluid consistency: Thin  Diet effective now              EDUCATION NEEDS:   Education needs have been addressed  Skin:  Skin Assessment: Reviewed RN Assessment  Last BM:  04/02/18  Height:   Ht Readings from Last 1 Encounters:  04/03/18 5'  4" (1.626 m)    Weight:   Wt Readings from Last 1 Encounters:  04/03/18 72.9 kg    Ideal Body Weight:  54.5 kg  BMI:  Body mass index is 27.59 kg/m.  Estimated Nutritional Needs:   Kcal:  1700-1900  Protein:  90-105 grams  Fluid:  1.7-1.9 L    Niomie Englert A. Jimmye Norman, RD, LDN, CDE Pager: [phone] After hours Pager: [phone]

## 2018-04-03 NOTE — Progress Notes (Signed)
Hematology/Oncology Consult note [facility]  Telephone:([phone] [phone]  Patient Care Team: Juluis Pitch, MD as PCP - General (Family Medicine) Telford Nab, RN as Registered Nurse   Name of the patient: Laura Booth  [id number]  05/12/1961   Date of visit: 04/03/2018  Interval history- feels better since admission. Sob is at baseline. Pain is well controleld    Review of systems- Review of Systems  Constitutional: Positive for malaise/fatigue. Negative for chills, fever and weight loss.  HENT: Negative for congestion, ear discharge and nosebleeds.   Eyes: Negative for blurred vision.  Respiratory: Positive for shortness of breath. Negative for cough, hemoptysis, sputum production and wheezing.   Cardiovascular: Positive for leg swelling. Negative for chest pain, palpitations, orthopnea and claudication.  Gastrointestinal: Negative for abdominal pain, blood in stool, constipation, diarrhea, heartburn, melena, nausea and vomiting.  Genitourinary: Negative for dysuria, flank pain, frequency, hematuria and urgency.  Musculoskeletal: Negative for back pain, joint pain and myalgias.  Skin: Negative for rash.  Neurological: Negative for dizziness, tingling, focal weakness, seizures, weakness and headaches.  Endo/Heme/Allergies: Does not bruise/bleed easily.  Psychiatric/Behavioral: Negative for depression and suicidal ideas. The patient does not have insomnia.       Allergies  Allergen Reactions  . Other Shortness Of Breath    Beta blocker      Past Medical History:  Diagnosis Date  . Asthma   . CHF (congestive heart failure) ([place])   . COPD (chronic obstructive pulmonary disease) ([place])   . Diabetes mellitus without complication ([place])   . Hypercholesteremia   . Hypertension   . Lung cancer ([place])   . Reflux esophagitis      Past Surgical History:  Procedure Laterality Date  . IR IMAGING GUIDED PORT INSERTION  02/11/2018      Social History   Socioeconomic History  . Marital status: Single    Spouse name: Not on file  . Number of children: Not on file  . Years of education: Not on file  . Highest education level: Not on file  Occupational History  . Not on file  Social Needs  . Financial resource strain: Not hard at all  . Food insecurity:    Worry: Never true    Inability: Never true  . Transportation needs:    Medical: No    Non-medical: No  Tobacco Use  . Smoking status: Former Smoker    Last attempt to quit: 01/12/2018    Years since quitting: 0.2  . Smokeless tobacco: Never Used  Substance and Sexual Activity  . Alcohol use: Yes    Frequency: Never    Comment: socially  . Drug use: No  . Sexual activity: Not Currently    Birth control/protection: Abstinence  Lifestyle  . Physical activity:    Days per week: 0 days    Minutes per session: 0 min  . Stress: Not on file  Relationships  . Social connections:    Talks on phone: More than three times a week    Gets together: More than three times a week    Attends religious service: Patient refused    Active member of club or organization: No    Attends meetings of clubs or organizations: Never    Relationship status: Never married  . Intimate partner violence:    Fear of current or ex partner: No    Emotionally abused: No    Physically abused: No    Forced sexual activity: No  Other Topics Concern  . Not on file  Social History Narrative  . Not on file    Family History  Problem Relation Age of Onset  . Diabetes Mother      Current Facility-Administered Medications:  .  0.9 %  sodium chloride infusion, , Intravenous, Continuous, Lateef, Munsoor, MD, Last Rate: 75 mL/hr at 04/03/18 0946 .  acetaminophen (TYLENOL) tablet 650 mg, 650 mg, Oral, Q6H PRN **OR** acetaminophen (TYLENOL) suppository 650 mg, 650 mg, Rectal, Q6H PRN, Dustin Flock, MD .  albuterol (PROVENTIL) (2.5 MG/3ML) 0.083% nebulizer solution 10 mg, 10 mg,  Nebulization, Once, Dustin Flock, MD .  atorvastatin (LIPITOR) tablet 40 mg, 40 mg, Oral, q1800, Dustin Flock, MD, 40 mg at 04/02/18 1621 .  feeding supplement (NEPRO CARB STEADY) liquid 237 mL, 237 mL, Oral, TID BM, Sainani, Vivek J, MD .  furosemide (LASIX) injection 20 mg, 20 mg, Intravenous, Q12H, Dustin Flock, MD, 20 mg at 04/03/18 1347 .  ipratropium-albuterol (DUONEB) 0.5-2.5 (3) MG/3ML nebulizer solution 3 mL, 3 mL, Nebulization, Q4H, Dustin Flock, MD, 3 mL at 04/03/18 1530 .  loratadine (CLARITIN) tablet 10 mg, 10 mg, Oral, Daily, Dustin Flock, MD, 10 mg at 04/03/18 0927 .  mometasone-formoterol (DULERA) 200-5 MCG/ACT inhaler 2 puff, 2 puff, Inhalation, BID, Dustin Flock, MD, 2 puff at 04/03/18 [phone] .  montelukast (SINGULAIR) tablet 10 mg, 10 mg, Oral, Daily, Jodell Cipro, Prasanna, MD, 10 mg at 04/03/18 0927 .  ondansetron (ZOFRAN) tablet 4 mg, 4 mg, Oral, Q6H PRN **OR** ondansetron (ZOFRAN) injection 4 mg, 4 mg, Intravenous, Q6H PRN, Dustin Flock, MD .  oxyCODONE (Oxy IR/ROXICODONE) immediate release tablet 5 mg, 5 mg, Oral, Q6H PRN, Dustin Flock, MD, 5 mg at 04/03/18 1347 .  oxyCODONE (OXYCONTIN) 12 hr tablet 10 mg, 10 mg, Oral, Q12H, Dustin Flock, MD, 10 mg at 04/03/18 0927 .  pantoprazole (PROTONIX) EC tablet 40 mg, 40 mg, Oral, Daily, Dustin Flock, MD, 40 mg at 04/03/18 0927 .  sodium zirconium cyclosilicate (LOKELMA) packet 10 g, 10 g, Oral, TID, Lateef, Munsoor, MD, 10 g at 04/03/18 1140 .  tiotropium (SPIRIVA) inhalation capsule 18 mcg, 18 mcg, Inhalation, Daily, Dustin Flock, MD, 18 mcg at 04/03/18 0928 .  warfarin (COUMADIN) tablet 2.5 mg, 2.5 mg, Oral, ONCE-1800, Dustin Flock, MD .  Warfarin - Pharmacist Dosing Inpatient, , Does not apply, q1800, Dustin Flock, MD  Facility-Administered Medications Ordered in Other Encounters:  .  heparin lock flush 100 unit/mL, 500 Units, Intravenous, Once, Sindy Guadeloupe, MD .  sodium chloride flush (NS)  0.9 % injection 10 mL, 10 mL, Intravenous, PRN, Sindy Guadeloupe, MD, 10 mL at 04/02/18 9381 .  sodium polystyrene (KAYEXALATE) 15 GM/60ML suspension 30 g, 30 g, Oral, Once, Sindy Guadeloupe, MD  Physical exam:  Vitals:   04/03/18 0017 04/03/18 0338 04/03/18 0423 04/03/18 0920  BP:   122/74 109/72  Pulse:   (!) 124 (!) 125  Resp:   20   Temp:   97.8 F (36.6 C) 98.4 F (36.9 C)  TempSrc:   Oral Oral  SpO2: 96% 97% 97% 100%  Weight: 160 lb 11.5 oz (72.9 kg)     Height: 5\' 4"  (1.626 m)      Physical Exam  Constitutional: She is oriented to person, place, and time.  Appears fatigued.   HENT:  Head: Normocephalic and atraumatic.  Eyes: Pupils are equal, round, and reactive to light. EOM are normal.  Neck: Normal range of motion.  Cardiovascular: Normal rate, regular  rhythm and normal heart sounds.  Pulmonary/Chest: Effort normal and breath sounds normal.  Abdominal: Soft. Bowel sounds are normal.  Musculoskeletal: She exhibits edema (b/l +2).  Neurological: She is alert and oriented to person, place, and time.  Skin: Skin is warm and dry.     CMP Latest Ref Rng & Units 04/03/2018  Glucose 70 - 99 mg/dL 126(H)  BUN 6 - 20 mg/dL 37(H)  Creatinine 0.44 - 1.00 mg/dL 0.61  Sodium 135 - 145 mmol/L 143  Potassium 3.5 - 5.1 mmol/L 5.9(H)  Chloride 98 - 111 mmol/L 103  CO2 22 - 32 mmol/L 30  Calcium 8.9 - 10.3 mg/dL 10.1  Total Protein 6.5 - 8.1 g/dL -  Total Bilirubin 0.3 - 1.2 mg/dL -  Alkaline Phos 38 - 126 U/L -  AST 15 - 41 U/L -  ALT 0 - 44 U/L -   CBC Latest Ref Rng & Units 04/03/2018  WBC 3.6 - 11.0 K/uL 20.6(H)  Hemoglobin 12.0 - 16.0 g/dL 9.3(L)  Hematocrit 35.0 - 47.0 % 28.8(L)  Platelets 150 - 440 K/uL 554(H)    @IMAGES @  Dg Chest Port 1 View  Result Date: 04/02/2018 CLINICAL DATA:  Shortness of breath. EXAM: PORTABLE CHEST 1 VIEW COMPARISON:  Radiographs of September 22, 2017. PET scan of January 28, 2018. FINDINGS: The heart size and mediastinal contours are within normal  limits. Interval placement of right sided Port-A-Cath with distal tip in expected position of cavoatrial junction. No pneumothorax or pleural effusion is noted. Right lung is unremarkable. Ill-defined opacity is seen laterally in the left upper lobe consistent with neoplasm as described on prior CT scan. New nodular density seen in left lower lobe also concerning for possible neoplasm. The visualized skeletal structures are unremarkable. IMPRESSION: Left upper lobe density is noted consistent with history of malignancy. New nodular density is seen in left lower lobe which may represent metastatic disease. CT scan may be performed for further evaluation. Electronically Signed   By: Marijo Conception, M.D.   On: 04/02/2018 13:29     Assessment and plan- Patient is a 57 y.o. female with stage IV lung cancer status post 2 cycles of carboplatin and Alimta and Keytruda admitted for severe hyperkalemia  1.  Hyperkalemia: Resolved after receiving zirconium. Nephrology on board appreciate their input. Possibly secondary to [place] as no other identifiable etiology. I did touch base with Dr. Holley Raring if she can be rechallenged with [place] in the future. He has recommended IVF, zirconium and close monitoring of potassium while on keytruda. He will also f/u with her as an outpatient.  She is likely to be discharged tomorrow if her potassium levels continue to remain normal  2.  AKI: Resolved after IV fluids.  Continue to monitor  3.  Stage IV adenocarcinoma of the lung: Chemotherapy was not given yesterday due to hyperkalemia.  I will follow-up with her as an outpatient next week and consider restarting treatment at that time.  She was due to get repeat scans after 2 cycles.  However because of ongoing HPI I will hold off on getting the scans as an inpatient and consider that as an outpatient with or without contrast based on her renal functions  4.  Because of her leukocytosis and thrombocytosis is unclear.   Differential mainly shows neutrophilia and this is likely reactive secondary to underlying malignancy and ongoing treatment.  Chest x-ray was negative for pneumonia.  She did not receive Neulasta as a part of her treatment.  Anemia secondary to chemotherapy. continue to monitor   Visit Diagnosis 1. SOB (shortness of breath)    2.  Hyperkalemia 3.  Stage IV lung cancer  Dr. Randa Evens, MD, MPH [facility] [phone] 04/03/2018 3:43 PM

## 2018-04-04 ENCOUNTER — Inpatient Hospital Stay: Payer: Commercial Managed Care - PPO

## 2018-04-04 DIAGNOSIS — D638 Anemia in other chronic diseases classified elsewhere: Secondary | ICD-10-CM

## 2018-04-04 DIAGNOSIS — R000 Tachycardia, unspecified: Secondary | ICD-10-CM

## 2018-04-04 DIAGNOSIS — C779 Secondary and unspecified malignant neoplasm of lymph node, unspecified: Secondary | ICD-10-CM

## 2018-04-04 DIAGNOSIS — E785 Hyperlipidemia, unspecified: Secondary | ICD-10-CM

## 2018-04-04 DIAGNOSIS — C7951 Secondary malignant neoplasm of bone: Secondary | ICD-10-CM

## 2018-04-04 DIAGNOSIS — C787 Secondary malignant neoplasm of liver and intrahepatic bile duct: Secondary | ICD-10-CM

## 2018-04-04 LAB — BASIC METABOLIC PANEL
ANION GAP: 11 (ref 5–15)
BUN: 19 mg/dL (ref 6–20)
CHLORIDE: 101 mmol/L (ref 98–111)
CO2: 29 mmol/L (ref 22–32)
CREATININE: 0.51 mg/dL (ref 0.44–1.00)
Calcium: 8.9 mg/dL (ref 8.9–10.3)
GFR calc non Af Amer: >60 mL/min (ref >60)
Glucose, Bld: 164 mg/dL — ABNORMAL HIGH (ref 70–99)
Potassium: 4.4 mmol/L (ref 3.5–5.1)
Sodium: 141 mmol/L (ref 135–145)

## 2018-04-04 LAB — URINALYSIS, COMPLETE (UACMP) WITH MICROSCOPIC
Bilirubin Urine: NEGATIVE
Glucose, UA: NEGATIVE mg/dL
Ketones, ur: NEGATIVE mg/dL
Leukocytes, UA: NEGATIVE
Nitrite: NEGATIVE
Protein, ur: NEGATIVE mg/dL
Specific Gravity, Urine: 1.005 (ref 1.005–1.030)
pH: 5 (ref 5.0–8.0)

## 2018-04-04 LAB — PROTIME-INR
INR: 1.75
Prothrombin Time: 20.3 seconds — ABNORMAL HIGH (ref 11.4–15.2)

## 2018-04-04 MED ORDER — SODIUM ZIRCONIUM CYCLOSILICATE 5 G PO PACK
10.0000 g | PACK | Freq: Every day | ORAL | Status: DC
  Administered 2018-04-05: 08:00:00 10 g via ORAL
  Filled 2018-04-04 (×2): qty 2

## 2018-04-04 MED ORDER — DILTIAZEM HCL ER COATED BEADS 120 MG PO CP24
120.0000 mg | ORAL_CAPSULE | Freq: Every day | ORAL | Status: DC
  Administered 2018-04-04 – 2018-04-05 (×2): 120 mg via ORAL
  Filled 2018-04-04 (×3): qty 1

## 2018-04-04 MED ORDER — OXYCODONE HCL 5 MG PO TABS
5.0000 mg | ORAL_TABLET | Freq: Once | ORAL | Status: AC
  Administered 2018-04-04: 19:00:00 5 mg via ORAL
  Filled 2018-04-04: qty 1

## 2018-04-04 MED ORDER — SODIUM CHLORIDE 0.9% FLUSH
10.0000 mL | INTRAVENOUS | Status: DC | PRN
  Administered 2018-04-04 – 2018-04-05 (×3): 10 mL via INTRAVENOUS
  Filled 2018-04-04 (×3): qty 10

## 2018-04-04 MED ORDER — OXYCODONE HCL 5 MG PO TABS
10.0000 mg | ORAL_TABLET | Freq: Four times a day (QID) | ORAL | Status: DC | PRN
  Administered 2018-04-04 – 2018-04-06 (×6): 10 mg via ORAL
  Filled 2018-04-04 (×6): qty 2

## 2018-04-04 MED ORDER — WARFARIN SODIUM 5 MG PO TABS
5.0000 mg | ORAL_TABLET | Freq: Once | ORAL | Status: AC
  Administered 2018-04-04: 5 mg via ORAL
  Filled 2018-04-04: qty 1

## 2018-04-04 NOTE — Progress Notes (Signed)
[facility]  ROUNDING NOTE   Subjective:  Patient doing much better as compared to admission. Potassium down to 4.4. Renal function also improved. Tolerating sodium zirconium well.   Objective:  Vital signs in last 24 hours:  Temp:  [98.3 F (36.8 C)-98.8 F (37.1 C)] 98.5 F (36.9 C) (09/14 0408) Pulse Rate:  [119-127] 122 (09/14 1037) Resp:  [16-20] 20 (09/14 1037) BP: (98-118)/(67-75) 118/67 (09/14 1037) SpO2:  [91 %-98 %] 92 % (09/14 1032)  Weight change:  Filed Weights   04/03/18 0017  Weight: 72.9 kg    Intake/Output: I/O last 3 completed shifts: In: 1763.4 [P.O.:240; I.V.:1523.4] Out: -    Intake/Output this shift:  Total I/O In: 373.8 [P.O.:240; I.V.:133.8] Out: -   Physical Exam: General: No acute distress  Head: Normocephalic, atraumatic. Moist oral mucosal membranes  Eyes: Anicteric  Neck: Supple, trachea midline  Lungs:  Clear to auscultation, normal effort  Heart: S1S2 no rubs  Abdomen:  Soft, nontender, bowel sounds present  Extremities: 2+ peripheral edema.  Neurologic: Awake, alert, following commands  Skin: No lesions  Access:     Basic Metabolic Panel: Recent Labs  Lab 04/02/18 0821 04/02/18 0920 04/02/18 1536 04/02/18 1855 04/03/18 0050 04/04/18 0545  NA 137  --   --   --  143 141  K 6.8* 6.7* 6.1* 5.9* 5.9* 4.4  CL 100  --   --   --  103 101  CO2 24  --   --   --  30 29  GLUCOSE 164*  --   --   --  126* 164*  BUN 58*  --   --   --  37* 19  CREATININE 1.02*  --   --   --  0.61 0.51  CALCIUM 9.8  --   --   --  10.1 8.9    Liver Function Tests: Recent Labs  Lab 04/02/18 0821  AST 27  ALT 29  ALKPHOS 303*  BILITOT 0.4  PROT 8.1  ALBUMIN 2.5*   No results for input(s): LIPASE, AMYLASE in the last 168 hours. No results for input(s): AMMONIA in the last 168 hours.  CBC: Recent Labs  Lab 04/02/18 0821 04/03/18 0050  WBC 18.5* 20.6*  NEUTROABS 16.9*  --   HGB 9.1* 9.3*  HCT 27.9* 28.8*  MCV 87.1 86.6   PLT 600* 554*    Cardiac Enzymes: No results for input(s): CKTOTAL, CKMB, CKMBINDEX, TROPONINI in the last 168 hours.  BNP: Invalid input(s): POCBNP  CBG: No results for input(s): GLUCAP in the last 168 hours.  Microbiology: Results for orders placed or performed during the hospital encounter of 09/19/17  Blood culture (routine x 2)     Status: None   Collection Time: 09/19/17 11:14 AM  Result Value Ref Range Status   Specimen Description BLOOD RIGHT ANTECUBITAL  Final   Special Requests   Final    BOTTLES DRAWN AEROBIC AND ANAEROBIC Blood Culture results may not be optimal due to an excessive volume of blood received in culture bottles   Culture   Final    NO GROWTH 5 DAYS Performed at [facility], [address], [place], [place] [address]    Report Status 09/24/2017 FINAL  Final  Blood culture (routine x 2)     Status: None   Collection Time: 09/19/17 11:14 AM  Result Value Ref Range Status   Specimen Description BLOOD BLOOD RIGHT HAND  Final   Special Requests   Final  BOTTLES DRAWN AEROBIC AND ANAEROBIC Blood Culture results may not be optimal due to an excessive volume of blood received in culture bottles   Culture   Final    NO GROWTH 5 DAYS Performed at [facility], [address] [place], [place] [address]    Report Status 09/24/2017 FINAL  Final    Coagulation Studies: Recent Labs    04/02/18 0920 04/03/18 0050 04/04/18 0545  LABPROT 22.1* 19.9* 20.3*  INR 1.95 1.71 1.75    Urinalysis: No results for input(s): COLORURINE, LABSPEC, PHURINE, GLUCOSEU, HGBUR, BILIRUBINUR, KETONESUR, PROTEINUR, UROBILINOGEN, NITRITE, LEUKOCYTESUR in the last 72 hours.  Invalid input(s): APPERANCEUR    Imaging: Dg Chest 2 View  Result Date: 04/04/2018 CLINICAL DATA:  Hx of lung CA, COPD, CHF, asthma Quit smoking 6-19 EXAM: CHEST - 2 VIEW COMPARISON:  04/02/2018 FINDINGS: Left suprahilar mass, poorly marginated on the frontal radiograph.  Persistent patchy opacities in the mid and upper lung. Right lung clear. Heart size and mediastinal contours are within normal limits. Aortic Atherosclerosis (ICD10-170.0). Power injectable right IJ port catheter to the distal SVC. Blunting of left lateral costophrenic angle without definite effusion on the lateral radiograph. Visualized bones unremarkable. IMPRESSION: Stable left pulmonary opacities.  No acute findings. Electronically Signed   By: Lucrezia Europe M.D.   On: 04/04/2018 09:43     Medications:    . albuterol  10 mg Nebulization Once  . atorvastatin  40 mg Oral q1800  . diltiazem  120 mg Oral Daily  . feeding supplement (NEPRO CARB STEADY)  237 mL Oral TID BM  . furosemide  20 mg Intravenous Q12H  . ipratropium-albuterol  3 mL Nebulization Q4H  . loratadine  10 mg Oral Daily  . mometasone-formoterol  2 puff Inhalation BID  . montelukast  10 mg Oral Daily  . oxyCODONE  10 mg Oral Q12H  . pantoprazole  40 mg Oral Daily  . sodium zirconium cyclosilicate  10 g Oral TID  . tiotropium  18 mcg Inhalation Daily  . warfarin  5 mg Oral ONCE-1800  . Warfarin - Pharmacist Dosing Inpatient   Does not apply q1800   acetaminophen **OR** acetaminophen, ondansetron **OR** ondansetron (ZOFRAN) IV, oxyCODONE, sodium chloride flush  Assessment/ Plan:  57 y.o. female with a PMHx ofleft ventricular thrombus with history of anti-coagulation, CHF, COPD, hypertension, diabetes mellitus type 2, tobacco abuse, metastatic lung cancer, who was admitted to [facility] on9/12/2019for evaluation of hyperkalemia.  1. Acute renal failure, likely related to chemotherapy, poor PO intake. 2. Hyperkalemia, suspect related to [place].  3. Anemia unspecified, likely related to chemo/malginancy. 4. Hypertension.   Plan: Patient doing much better as compared to admission.  Acute renal failure significantly improved.  Hyperkalemia also now resolved with sodium zirconium as potassium currently 4.4.  Case  discussed with oncology.  They would like to maintain the patient on Keytruda.  We will maintain the patient on sodium zirconium at this point in time to prevent further hyperkalemia from needed chemotherapy.  We will follow the patient very closely in the office to make sure that she is not redeveloping hyperkalemia.  Thanks for consultation.   LOS: 2 Orine Goga 9/14/20192:27 PM

## 2018-04-04 NOTE — Progress Notes (Signed)
Spoke with patient's RN.  Patient wishes to have oxygen left on and is being adamant she wears home oxygen at night and this is the time of day that she starts to get tired and knows she will start to get tired and she also stated she likes to hear the bubbling noise.  I called RN to explain what had been told to me.  RN called back to say she had reinterated the point to the  Patient that we needed documentation of her dropping her saturations below 90%.  Patient was aggreeable to calling when she becomes short of breath so we could check her saturations and re assess her need for oxygen.

## 2018-04-04 NOTE — Progress Notes (Signed)
Laura Booth at [facility] NAME: Laura Booth    MR#:  [id number]  DATE OF BIRTH:  Sep 17, 1960  SUBJECTIVE:   Patient presented with hyperkalemia.  Potassium level has improved, renal function is also improved.  Hyperkalemia is thought to be secondary to her immunotherapy Keytruda.  Patient still complaining of some specific pain which is chronic for her.  No other acute events overnight.  REVIEW OF SYSTEMS:    Review of Systems  Constitutional: Negative for chills and fever.  HENT: Negative for congestion and tinnitus.   Eyes: Negative for blurred vision and double vision.  Respiratory: Negative for cough, shortness of breath and wheezing.   Cardiovascular: Negative for chest pain, orthopnea and PND.  Gastrointestinal: Negative for abdominal pain, diarrhea, nausea and vomiting.  Genitourinary: Negative for dysuria and hematuria.  Neurological: Negative for dizziness, sensory change and focal weakness.  All other systems reviewed and are negative.   Nutrition: Heart Healthy/Carb control Tolerating Diet: Yes Tolerating PT: Ambulatory  DRUG ALLERGIES:   Allergies  Allergen Reactions  . Other Shortness Of Breath    Beta blocker     VITALS:  Blood pressure 118/67, pulse (!) 122, temperature 98.5 F (36.9 C), temperature source Oral, resp. rate 20, height 5\' 4"  (1.626 m), weight 72.9 kg, SpO2 92 %.  PHYSICAL EXAMINATION:   Physical Exam  GENERAL:  57 y.o.-year-old patient lying in bed in no acute distress.  EYES: Pupils equal, round, reactive to light and accommodation. No scleral icterus. Extraocular muscles intact.  HEENT: Head atraumatic, normocephalic. Oropharynx and nasopharynx clear.  NECK:  Supple, no jugular venous distention. No thyroid enlargement, no tenderness.  LUNGS: Normal breath sounds bilaterally, no wheezing, rales, rhonchi. No use of accessory muscles of respiration.  CARDIOVASCULAR: S1, S2 normal. No murmurs, rubs,  or gallops.  ABDOMEN: Soft, nontender, nondistended. Bowel sounds present. No organomegaly or mass.  EXTREMITIES: No cyanosis, clubbing or edema b/l.    NEUROLOGIC: Cranial nerves II through XII are intact. No focal Motor or sensory deficits b/l.   PSYCHIATRIC: The patient is alert and oriented x 3.  SKIN: No obvious rash, lesion, or ulcer.    LABORATORY PANEL:   CBC Recent Labs  Lab 04/03/18 0050  WBC 20.6*  HGB 9.3*  HCT 28.8*  PLT 554*   ------------------------------------------------------------------------------------------------------------------  Chemistries  Recent Labs  Lab 04/02/18 0821  04/04/18 0545  NA 137   < > 141  K 6.8*   < > 4.4  CL 100   < > 101  CO2 24   < > 29  GLUCOSE 164*   < > 164*  BUN 58*   < > 19  CREATININE 1.02*   < > 0.51  CALCIUM 9.8   < > 8.9  AST 27  --   --   ALT 29  --   --   ALKPHOS 303*  --   --   BILITOT 0.4  --   --    < > = values in this interval not displayed.   ------------------------------------------------------------------------------------------------------------------  Cardiac Enzymes No results for input(s): TROPONINI in the last 168 hours. ------------------------------------------------------------------------------------------------------------------  RADIOLOGY:  Dg Chest 2 View  Result Date: 04/04/2018 CLINICAL DATA:  Hx of lung CA, COPD, CHF, asthma Quit smoking 6-19 EXAM: CHEST - 2 VIEW COMPARISON:  04/02/2018 FINDINGS: Left suprahilar mass, poorly marginated on the frontal radiograph. Persistent patchy opacities in the mid and upper lung. Right lung clear. Heart size and mediastinal contours  are within normal limits. Aortic Atherosclerosis (ICD10-170.0). Power injectable right IJ port catheter to the distal SVC. Blunting of left lateral costophrenic angle without definite effusion on the lateral radiograph. Visualized bones unremarkable. IMPRESSION: Stable left pulmonary opacities.  No acute findings.  Electronically Signed   By: Lucrezia Europe M.D.   On: 04/04/2018 09:43     ASSESSMENT AND PLAN:   57 year old female with past medical history of lung cancer, COPD, hypertension, hyperlipidemia, diabetes who presents to the hospital due to severe hyperkalemia  1.  Hyperkalemia- patient presented to the hospital with a potassium of 6.7.   thought to be secondary to acute kidney injury along with patient being on Keytruda. - appreciate nephro input and cont. Lokelma.  Potasium level down to 4.5 today.  - cont. Fluids, Lokelma for now.   2. ARF - due to dehydration.  Much improved with IV fluids and will continue to monitor. -Appreciate nephrology input.  3.  History of left ventricular thrombus-continue Coumadin.  4.  COPD-no acute exacerbation, continue Dulera, Spiriva, duo nebs.  5.  Hyperlipidemia-continue atorvastatin.  6.  GERD-continue Protonix.  7. Hx of Lung Cancer - follows with Dr. Janese Banks. Currently on Immunotherapy with Keytruda.  - follow up with them as outpatient.   8. Tachycardia-   Sinus tachy   TSH was normal a month ago.   Cardizem long acting tabs.  Possible is normal, but have tachycardia, so monitor tonight.   All the records are reviewed and case discussed with Care Management/Social Worker. Management plans discussed with the patient, family and they are in agreement.  CODE STATUS: Full code  DVT Prophylaxis: Coumadin  TOTAL TIME TAKING CARE OF THIS PATIENT: 30 minutes.   POSSIBLE D/C IN 1-2 DAYS, DEPENDING ON CLINICAL CONDITION.   [name] M.D on 04/04/2018 at 3:37 PM  Between 7am to 6pm - Pager - [phone]  After 6pm go to [email or web address] - Proofreader  [facility]  Office  [phone]  CC: Primary care physician; Juluis Pitch, MD

## 2018-04-04 NOTE — Progress Notes (Signed)
ANTICOAGULATION CONSULT NOTE - Initial Consult  Pharmacy Consult for Warfarin dosing Indication: left ventricular apical thrombus following MI  Allergies  Allergen Reactions  . Other Shortness Of Breath    Beta blocker     Patient Measurements: Height: 5\' 4"  (162.6 cm) Weight: 160 lb 11.5 oz (72.9 kg) IBW/kg (Calculated) : 54.7 Heparin Dosing Weight:   Vital Signs: Temp: 98.5 F (36.9 C) (09/14 0408) Temp Source: Oral (09/14 0408) BP: 114/75 (09/14 0408) Pulse Rate: 127 (09/14 0408)  Labs: Recent Labs    04/02/18 0821 04/02/18 0920 04/03/18 0050 04/04/18 0545  HGB 9.1*  --  9.3*  --   HCT 27.9*  --  28.8*  --   PLT 600*  --  554*  --   LABPROT  --  22.1* 19.9* 20.3*  INR  --  1.95 1.71 1.75  CREATININE 1.02*  --  0.61 0.51    Estimated Creatinine Clearance: 75 mL/min (by C-G formula based on SCr of 0.51 mg/dL).   Medical History: Past Medical History:  Diagnosis Date  . Asthma   . CHF (congestive heart failure) ([place])   . COPD (chronic obstructive pulmonary disease) ([place])   . Diabetes mellitus without complication ([place])   . Hypercholesteremia   . Hypertension   . Lung cancer ([place])   . Reflux esophagitis     Medications:  Warfarin 5 mg once daily  Assessment: 57 y.o. Female with history of asthma, CHF, COPD, diabetes type 2, hyperlipidemia, hypertension and diagnosis of lung cancer who was supposed to get her third dose of chemo today.  Blood work showed elevated K and sent to [facility] for admission.  Upon chart review patient has history of left ventricular apical thrombus following MI requiring warfarin.  Per patient she did not take a dose PTA on 09/12. Per patient most recent dose was warfarin 5 mg once daily and according to her resulting in supratherapeutic INR leading recent hold. I did find an INR>10 for 09/04 and she reports taking no doses since that reading. She was admitted here early in March with a supratherapeutic INR but her dose was a 7.5/10 mg  combination. She did not get any doses here to evaluate her response. There are no new significant DDIs.                INR      Warfarin 9/12   1.95         2.5 9/13   1.71         2.5 9/14   1.75        5 mg   Goal of Therapy:  INR 2-3 Monitor platelets by anticoagulation protocol: Yes   Plan:  Will order warfarin 5 mg once tonight.  Will check INR with morning labs to reevaluate dose.   Vallery Sa, PharmD 04/04/2018,8:25 AM

## 2018-04-04 NOTE — Progress Notes (Addendum)
VS check indicated pulse ox 89-90 % on RA with pt not c/O SOB but reports she can tell in the evenings she needs her oxygen. 02 @1l /[place] applied with sats 91/92 %. HR 110. No respiratory distress or SOB reported.

## 2018-04-04 NOTE — Progress Notes (Addendum)
Weaned off 02 with sats stable at 92% RA. Reports continuous right arm and right chest/back pain with tylenol and oxycontin used Slept long nap after oxycontin this morning. Noted right sided weakness in arm/leg. Meds adjusted related to lokelma use. Family in frequently.  Cardizem started due to tachycardia with asymptomatic HR 120's- pt states she had this a long time with no response from trials of "beta blockers -metoprolol, coreg and 1 other that she couldn't remember the name.

## 2018-04-04 NOTE — Progress Notes (Signed)
Family member called and ask for oxygen level to be checked. Pt found in BR leaning over sink, dysneic, weak and reporting right arm/back pain with 02 off. Pt assisted back to bed with 1+, 02 reapplied at 1l/[place], See vs's with increased HR 130's and monitored.  Pt recovered with rest and 02 reapplication with pt/family instructed let staff ambulate with pt to BR and added connective tubing to allow pt to wear 02 to BR. Pt reports her pain is "8" and oxycodone 5 mg for breakthrough pain not enough. Oxycodone 5 mg po given. Paged Dr. Leslye Peer with order to increase oxycodone to 10 mg po. Pt anf family updated.

## 2018-04-05 LAB — BASIC METABOLIC PANEL
ANION GAP: 11 (ref 5–15)
BUN: 12 mg/dL (ref 6–20)
CALCIUM: 8.8 mg/dL — AB (ref 8.9–10.3)
CHLORIDE: 96 mmol/L — AB (ref 98–111)
CO2: 33 mmol/L — AB (ref 22–32)
Creatinine, Ser: 0.64 mg/dL (ref 0.44–1.00)
GFR calc Af Amer: >60 mL/min (ref >60)
GFR calc non Af Amer: >60 mL/min (ref >60)
GLUCOSE: 117 mg/dL — AB (ref 70–99)
POTASSIUM: 3.8 mmol/L (ref 3.5–5.1)
Sodium: 140 mmol/L (ref 135–145)

## 2018-04-05 LAB — CBC
HEMATOCRIT: 24.9 % — AB (ref 35.0–47.0)
HEMOGLOBIN: 8.1 g/dL — AB (ref 12.0–16.0)
MCH: 28.2 pg (ref 26.0–34.0)
MCHC: 32.7 g/dL (ref 32.0–36.0)
MCV: 86.2 fL (ref 80.0–100.0)
Platelets: 393 10*3/uL (ref 150–440)
RBC: 2.89 MIL/uL — ABNORMAL LOW (ref 3.80–5.20)
RDW: 16.6 % — ABNORMAL HIGH (ref 11.5–14.5)
WBC: 18 10*3/uL — ABNORMAL HIGH (ref 3.6–11.0)

## 2018-04-05 LAB — PROTIME-INR
INR: 1.91
PROTHROMBIN TIME: 21.7 s — AB (ref 11.4–15.2)

## 2018-04-05 MED ORDER — INFLUENZA VAC SPLIT QUAD 0.5 ML IM SUSY: 0.5000 mL | PREFILLED_SYRINGE | INTRAMUSCULAR | Status: DC

## 2018-04-05 MED ORDER — WARFARIN SODIUM 5 MG PO TABS
5.0000 mg | ORAL_TABLET | Freq: Once | ORAL | Status: AC
  Administered 2018-04-05: 5 mg via ORAL
  Filled 2018-04-05: qty 1

## 2018-04-05 MED ORDER — MAGNESIUM HYDROXIDE 400 MG/5ML PO SUSP
30.0000 mL | Freq: Once | ORAL | Status: AC
  Administered 2018-04-05: 30 mL via ORAL
  Filled 2018-04-05: qty 30

## 2018-04-05 MED ORDER — POLYETHYLENE GLYCOL 3350 17 G PO PACK
17.0000 g | PACK | Freq: Every day | ORAL | Status: DC
  Administered 2018-04-05 – 2018-04-06 (×2): 17 g via ORAL
  Filled 2018-04-05 (×2): qty 1

## 2018-04-05 MED ORDER — MORPHINE SULFATE (PF) 2 MG/ML IV SOLN
2.0000 mg | Freq: Once | INTRAVENOUS | Status: AC
  Administered 2018-04-05: 2 mg via INTRAVENOUS
  Filled 2018-04-05: qty 1

## 2018-04-05 NOTE — Progress Notes (Addendum)
Pt very DOE/increased pain with mobility to BR with pt requiring 1+ to return to bed this a.m. Continued on 02 @ 2l/[place]. Reports pain uncontrolled with activity with MD notified. Morphine 2 mg IV x 1 given with partial effect. Palliative care consult placed after MD had discussion with pt/family member. Laxatives given for constipation with small BM thus far; will continue to monitor. Ambulating around FOB with 02 and states she feels better moving around now. HR in 100's with BP lower norms. Feels generalized fatigue.

## 2018-04-05 NOTE — Progress Notes (Signed)
Family Meeting Note  Advance Directive:no  Today a meeting took place with the Patient and neice- POA.  The following clinical team members were present during this meeting:MD  The following were discussed:Patient's diagnosis: metastatic lung cancer  , Patient's progosis: < 12 months and Goals for treatment: Full Code Patient today told me, she is having significant side effect to this chemotherapy and she is losing appetite and getting generalized weakness due to that. She also told me " I know, I am going to die." I had detailed discussion about her diagnosis of Metastatic cancer, prognosis, side effects of chemo and concept of palliative care and hospice with her. She will like to meet them to make decisions.  Additional follow-up to be provided: palliative care  Time spent during discussion:20 minutes  Laura Basta, MD

## 2018-04-05 NOTE — Progress Notes (Addendum)
Hematology/Oncology Consult note [facility]  [phone] [phone]  Patient Care Team: Juluis Pitch, MD as PCP - General (Family Medicine) Telford Nab, RN as Registered Nurse   Name of the patient: Laura Booth  [id number]  19-Oct-1960   Date of visit: 04/05/2018     Interval history- she is sitting up in her chair and feels better. Sob is at her baseline. Feels fatigued  ECOG PS- 2 Pain scale- 4 Opioid associated constipation- no  Review of systems- Review of Systems  Constitutional: Positive for malaise/fatigue. Negative for chills, fever and weight loss.  HENT: Negative for congestion, ear discharge and nosebleeds.   Eyes: Negative for blurred vision.  Respiratory: Positive for shortness of breath. Negative for cough, hemoptysis, sputum production and wheezing.   Cardiovascular: Positive for leg swelling. Negative for chest pain, palpitations, orthopnea and claudication.  Gastrointestinal: Negative for abdominal pain, blood in stool, constipation, diarrhea, heartburn, melena, nausea and vomiting.  Genitourinary: Negative for dysuria, flank pain, frequency, hematuria and urgency.  Musculoskeletal: Negative for back pain, joint pain and myalgias.  Skin: Negative for rash.  Neurological: Negative for dizziness, tingling, focal weakness, seizures, weakness and headaches.  Endo/Heme/Allergies: Does not bruise/bleed easily.  Psychiatric/Behavioral: Negative for depression and suicidal ideas. The patient does not have insomnia.       Allergies  Allergen Reactions  . Other Shortness Of Breath    Beta blocker      Past Medical History:  Diagnosis Date  . Asthma   . CHF (congestive heart failure) ([place])   . COPD (chronic obstructive pulmonary disease) ([place])   . Diabetes mellitus without complication ([place])   . Hypercholesteremia   . Hypertension   . Lung cancer ([place])   . Reflux esophagitis      Past Surgical  History:  Procedure Laterality Date  . IR IMAGING GUIDED PORT INSERTION  02/11/2018    Social History   Socioeconomic History  . Marital status: Single    Spouse name: Not on file  . Number of children: Not on file  . Years of education: Not on file  . Highest education level: Not on file  Occupational History  . Not on file  Social Needs  . Financial resource strain: Not hard at all  . Food insecurity:    Worry: Never true    Inability: Never true  . Transportation needs:    Medical: No    Non-medical: No  Tobacco Use  . Smoking status: Former Smoker    Last attempt to quit: 01/12/2018    Years since quitting: 0.2  . Smokeless tobacco: Never Used  Substance and Sexual Activity  . Alcohol use: Yes    Frequency: Never    Comment: socially  . Drug use: No  . Sexual activity: Not Currently    Birth control/protection: Abstinence  Lifestyle  . Physical activity:    Days per week: 0 days    Minutes per session: 0 min  . Stress: Not on file  Relationships  . Social connections:    Talks on phone: More than three times a week    Gets together: More than three times a week    Attends religious service: Patient refused    Active member of club or organization: No    Attends meetings of clubs or organizations: Never    Relationship status: Never married  . Intimate partner violence:    Fear of current or ex partner: No    Emotionally  abused: No    Physically abused: No    Forced sexual activity: No  Other Topics Concern  . Not on file  Social History Narrative  . Not on file    Family History  Problem Relation Age of Onset  . Diabetes Mother      Current Facility-Administered Medications:  .  acetaminophen (TYLENOL) tablet 650 mg, 650 mg, Oral, Q6H PRN, 650 mg at 04/03/18 1818 **OR** acetaminophen (TYLENOL) suppository 650 mg, 650 mg, Rectal, Q6H PRN, Dustin Flock, MD, 650 mg at 04/05/18 0840 .  albuterol (PROVENTIL) (2.5 MG/3ML) 0.083% nebulizer solution 10  mg, 10 mg, Nebulization, Once, Dustin Flock, MD .  atorvastatin (LIPITOR) tablet 40 mg, 40 mg, Oral, q1800, Dustin Flock, MD, 40 mg at 04/04/18 1729 .  diltiazem (CARDIZEM CD) 24 hr capsule 120 mg, 120 mg, Oral, Daily, [name], MD, 120 mg at 04/05/18 1045 .  feeding supplement (NEPRO CARB STEADY) liquid 237 mL, 237 mL, Oral, TID BM, Sainani, Belia Heman, MD, Last Rate: 0 mL/hr at 04/04/18 0613, 237 mL at 04/05/18 1401 .  furosemide (LASIX) injection 20 mg, 20 mg, Intravenous, Q12H, Dustin Flock, MD, 20 mg at 04/05/18 1402 .  ipratropium-albuterol (DUONEB) 0.5-2.5 (3) MG/3ML nebulizer solution 3 mL, 3 mL, Nebulization, Q4H, Dustin Flock, MD, 3 mL at 04/05/18 1450 .  loratadine (CLARITIN) tablet 10 mg, 10 mg, Oral, Daily, Dustin Flock, MD, 10 mg at 04/05/18 1046 .  mometasone-formoterol (DULERA) 200-5 MCG/ACT inhaler 2 puff, 2 puff, Inhalation, BID, Dustin Flock, MD, 2 puff at 04/05/18 [phone] .  montelukast (SINGULAIR) tablet 10 mg, 10 mg, Oral, Daily, Jodell Cipro, Prasanna, MD, 10 mg at 04/05/18 1046 .  ondansetron (ZOFRAN) tablet 4 mg, 4 mg, Oral, Q6H PRN **OR** ondansetron (ZOFRAN) injection 4 mg, 4 mg, Intravenous, Q6H PRN, Dustin Flock, MD .  oxyCODONE (Oxy IR/ROXICODONE) immediate release tablet 10 mg, 10 mg, Oral, Q6H PRN, Loletha Grayer, MD, 10 mg at 04/05/18 1405 .  oxyCODONE (OXYCONTIN) 12 hr tablet 10 mg, 10 mg, Oral, Q12H, Dustin Flock, MD, 10 mg at 04/05/18 1046 .  pantoprazole (PROTONIX) EC tablet 40 mg, 40 mg, Oral, Daily, Dustin Flock, MD, 40 mg at 04/05/18 1046 .  polyethylene glycol (MIRALAX / GLYCOLAX) packet 17 g, 17 g, Oral, Daily, [name], MD, 17 g at 04/05/18 1047 .  sodium chloride flush (NS) 0.9 % injection 10 mL, 10 mL, Intravenous, PRN, [name], MD, 10 mL at 04/05/18 1403 .  sodium zirconium cyclosilicate (LOKELMA) packet 10 g, 10 g, Oral, Daily, [name], MD, 10 g at 04/05/18 0821 .  tiotropium  (SPIRIVA) inhalation capsule 18 mcg, 18 mcg, Inhalation, Daily, Dustin Flock, MD, 18 mcg at 04/05/18 0823 .  warfarin (COUMADIN) tablet 5 mg, 5 mg, Oral, ONCE-1800, [name], MD .  Warfarin - Pharmacist Dosing Inpatient, , Does not apply, q1800, Dustin Flock, MD  Facility-Administered Medications Ordered in Other Encounters:  .  heparin lock flush 100 unit/mL, 500 Units, Intravenous, Once, Sindy Guadeloupe, MD .  sodium chloride flush (NS) 0.9 % injection 10 mL, 10 mL, Intravenous, PRN, Sindy Guadeloupe, MD, 10 mL at 04/02/18 7616 .  sodium polystyrene (KAYEXALATE) 15 GM/60ML suspension 30 g, 30 g, Oral, Once, Sindy Guadeloupe, MD  Physical exam:  Vitals:   04/05/18 0716 04/05/18 0832 04/05/18 1106 04/05/18 1407  BP:  116/75  95/62  Pulse:  (!) 114  (!) 104  Resp:  20  18  Temp:  98.5 F (36.9 C)  98  F (36.7 C)  TempSrc:  Oral  Oral  SpO2: 95% 95% 92% 97%  Weight:      Height:       Physical Exam  Constitutional: She is oriented to person, place, and time.  Appears older than stated age. fatigued  HENT:  Head: Normocephalic and atraumatic.  Eyes: Pupils are equal, round, and reactive to light. EOM are normal.  Neck: Normal range of motion.  Cardiovascular: Regular rhythm and normal heart sounds.  tachycardic  Pulmonary/Chest: Effort normal.  Scattered b/l wheezing  Abdominal: Soft. Bowel sounds are normal.  Musculoskeletal: She exhibits edema (b/l compression stockings in place).  Neurological: She is alert and oriented to person, place, and time.  Skin: Skin is warm and dry.     CMP Latest Ref Rng & Units 04/05/2018  Glucose 70 - 99 mg/dL 117(H)  BUN 6 - 20 mg/dL 12  Creatinine 0.44 - 1.00 mg/dL 0.64  Sodium 135 - 145 mmol/L 140  Potassium 3.5 - 5.1 mmol/L 3.8  Chloride 98 - 111 mmol/L 96(L)  CO2 22 - 32 mmol/L 33(H)  Calcium 8.9 - 10.3 mg/dL 8.8(L)  Total Protein 6.5 - 8.1 g/dL -  Total Bilirubin 0.3 - 1.2 mg/dL -  Alkaline Phos 38 - 126 U/L -  AST  15 - 41 U/L -  ALT 0 - 44 U/L -   CBC Latest Ref Rng & Units 04/05/2018  WBC 3.6 - 11.0 K/uL 18.0(H)  Hemoglobin 12.0 - 16.0 g/dL 8.1(L)  Hematocrit 35.0 - 47.0 % 24.9(L)  Platelets 150 - 440 K/uL 393    @IMAGES @  Dg Chest 2 View  Result Date: 04/04/2018 CLINICAL DATA:  Hx of lung CA, COPD, CHF, asthma Quit smoking 6-19 EXAM: CHEST - 2 VIEW COMPARISON:  04/02/2018 FINDINGS: Left suprahilar mass, poorly marginated on the frontal radiograph. Persistent patchy opacities in the mid and upper lung. Right lung clear. Heart size and mediastinal contours are within normal limits. Aortic Atherosclerosis (ICD10-170.0). Power injectable right IJ port catheter to the distal SVC. Blunting of left lateral costophrenic angle without definite effusion on the lateral radiograph. Visualized bones unremarkable. IMPRESSION: Stable left pulmonary opacities.  No acute findings. Electronically Signed   By: Lucrezia Europe M.D.   On: 04/04/2018 09:43   Dg Chest Port 1 View  Result Date: 04/02/2018 CLINICAL DATA:  Shortness of breath. EXAM: PORTABLE CHEST 1 VIEW COMPARISON:  Radiographs of September 22, 2017. PET scan of January 28, 2018. FINDINGS: The heart size and mediastinal contours are within normal limits. Interval placement of right sided Port-A-Cath with distal tip in expected position of cavoatrial junction. No pneumothorax or pleural effusion is noted. Right lung is unremarkable. Ill-defined opacity is seen laterally in the left upper lobe consistent with neoplasm as described on prior CT scan. New nodular density seen in left lower lobe also concerning for possible neoplasm. The visualized skeletal structures are unremarkable. IMPRESSION: Left upper lobe density is noted consistent with history of malignancy. New nodular density is seen in left lower lobe which may represent metastatic disease. CT scan may be performed for further evaluation. Electronically Signed   By: Marijo Conception, M.D.   On: 04/02/2018 13:29      Assessment and plan- Patient is a 57 y.o. female with history of stage IV lung cancer with liver and bone metastases as well as metastases to lymph node presenting with hyperkalemia likely secondary to [facility]  1.  Hyperkalemia: Secondary to Keytruda likely since no other identifiable cause  was found.  Potassium has now normalized after zirconium. Appreciate nephrology input. She will need outpatient f/u with them as well if Beryle Flock will be rechallenged  2. Stage IV lung cancer- s/p 2 cycles of chemotherapy. Recommend getting CT chest abdomen pelvis with contrast to assess interim response as kidney functions are now back to baseline  3. Sinus tachycardia: she has that at baseline likely secondary to malignancy and ongoing anemia. cardizem should be used cautiously to avoid hypotension especialy since she has sinus tachycardia not afib. I will touch base with Dr. Nehemiah Massed her cardiologist as an outpatient as well  4. Anemia: likely due to malignancy and ongoing chemotehrapy. Iron studies indicative of anemia of chronic disease. Continue to monitor  I will f/u with her next week as an outpatient   Visit Diagnosis 1. SOB (shortness of breath)   2. Hypoxia      Dr. Randa Evens, MD, MPH [facility] at [facility] [phone] 04/05/2018 4:58 PM

## 2018-04-05 NOTE — Progress Notes (Signed)
[facility]  ROUNDING NOTE   Subjective:  Patient seen at bedside. Sitting up in chair. Serum potassium 3.8. Serum creatinine 0.64.   Objective:  Vital signs in last 24 hours:  Temp:  [98.5 F (36.9 C)-99.1 F (37.3 C)] 98.5 F (36.9 C) (09/15 0832) Pulse Rate:  [108-131] 114 (09/15 0832) Resp:  [16-24] 20 (09/15 0832) BP: (95-125)/(64-80) 116/75 (09/15 0832) SpO2:  [88 %-97 %] 92 % (09/15 1106)  Weight change:  Filed Weights   04/03/18 0017  Weight: 72.9 kg    Intake/Output: I/O last 3 completed shifts: In: 2371.2 [P.O.:240; I.V.:1657.2; NG/GT:474] Out: -    Intake/Output this shift:  Total I/O In: 240 [P.O.:240] Out: -   Physical Exam: General: No acute distress  Head: Normocephalic, atraumatic. Moist oral mucosal membranes  Eyes: Anicteric  Neck: Supple, trachea midline  Lungs:  Clear to auscultation, normal effort  Heart: S1S2 no rubs  Abdomen:  Soft, nontender, bowel sounds present  Extremities: 2+ peripheral edema.  Neurologic: Awake, alert, following commands  Skin: No lesions  Access:     Basic Metabolic Panel: Recent Labs  Lab 04/02/18 0821  04/02/18 1536 04/02/18 1855 04/03/18 0050 04/04/18 0545 04/05/18 0513  NA 137  --   --   --  143 141 140  K 6.8*   < > 6.1* 5.9* 5.9* 4.4 3.8  CL 100  --   --   --  103 101 96*  CO2 24  --   --   --  30 29 33*  GLUCOSE 164*  --   --   --  126* 164* 117*  BUN 58*  --   --   --  37* 19 12  CREATININE 1.02*  --   --   --  0.61 0.51 0.64  CALCIUM 9.8  --   --   --  10.1 8.9 8.8*   < > = values in this interval not displayed.    Liver Function Tests: Recent Labs  Lab 04/02/18 0821  AST 27  ALT 29  ALKPHOS 303*  BILITOT 0.4  PROT 8.1  ALBUMIN 2.5*   No results for input(s): LIPASE, AMYLASE in the last 168 hours. No results for input(s): AMMONIA in the last 168 hours.  CBC: Recent Labs  Lab 04/02/18 0821 04/03/18 0050 04/05/18 0513  WBC 18.5* 20.6* 18.0*  NEUTROABS 16.9*   --   --   HGB 9.1* 9.3* 8.1*  HCT 27.9* 28.8* 24.9*  MCV 87.1 86.6 86.2  PLT 600* 554* 393    Cardiac Enzymes: No results for input(s): CKTOTAL, CKMB, CKMBINDEX, TROPONINI in the last 168 hours.  BNP: Invalid input(s): POCBNP  CBG: No results for input(s): GLUCAP in the last 168 hours.  Microbiology: Results for orders placed or performed during the hospital encounter of 09/19/17  Blood culture (routine x 2)     Status: None   Collection Time: 09/19/17 11:14 AM  Result Value Ref Range Status   Specimen Description BLOOD RIGHT ANTECUBITAL  Final   Special Requests   Final    BOTTLES DRAWN AEROBIC AND ANAEROBIC Blood Culture results may not be optimal due to an excessive volume of blood received in culture bottles   Culture   Final    NO GROWTH 5 DAYS Performed at [facility], [address], [place], [place] [address]    Report Status 09/24/2017 FINAL  Final  Blood culture (routine x 2)     Status: None   Collection  Time: 09/19/17 11:14 AM  Result Value Ref Range Status   Specimen Description BLOOD BLOOD RIGHT HAND  Final   Special Requests   Final    BOTTLES DRAWN AEROBIC AND ANAEROBIC Blood Culture results may not be optimal due to an excessive volume of blood received in culture bottles   Culture   Final    NO GROWTH 5 DAYS Performed at [facility], [address] [place], [place] [address]    Report Status 09/24/2017 FINAL  Final    Coagulation Studies: Recent Labs    04/03/18 0050 04/04/18 0545 04/05/18 0513  LABPROT 19.9* 20.3* 21.7*  INR 1.71 1.75 1.91    Urinalysis: Recent Labs    04/04/18 1545  COLORURINE STRAW*  LABSPEC 1.005  PHURINE 5.0  GLUCOSEU NEGATIVE  HGBUR SMALL*  BILIRUBINUR NEGATIVE  KETONESUR NEGATIVE  PROTEINUR NEGATIVE  NITRITE NEGATIVE  LEUKOCYTESUR NEGATIVE      Imaging: Dg Chest 2 View  Result Date: 04/04/2018 CLINICAL DATA:  Hx of lung CA, COPD, CHF, asthma Quit smoking 6-19 EXAM: CHEST - 2 VIEW  COMPARISON:  04/02/2018 FINDINGS: Left suprahilar mass, poorly marginated on the frontal radiograph. Persistent patchy opacities in the mid and upper lung. Right lung clear. Heart size and mediastinal contours are within normal limits. Aortic Atherosclerosis (ICD10-170.0). Power injectable right IJ port catheter to the distal SVC. Blunting of left lateral costophrenic angle without definite effusion on the lateral radiograph. Visualized bones unremarkable. IMPRESSION: Stable left pulmonary opacities.  No acute findings. Electronically Signed   By: Lucrezia Europe M.D.   On: 04/04/2018 09:43     Medications:    . albuterol  10 mg Nebulization Once  . atorvastatin  40 mg Oral q1800  . diltiazem  120 mg Oral Daily  . feeding supplement (NEPRO CARB STEADY)  237 mL Oral TID BM  . furosemide  20 mg Intravenous Q12H  . ipratropium-albuterol  3 mL Nebulization Q4H  . loratadine  10 mg Oral Daily  . mometasone-formoterol  2 puff Inhalation BID  . montelukast  10 mg Oral Daily  . oxyCODONE  10 mg Oral Q12H  . pantoprazole  40 mg Oral Daily  . polyethylene glycol  17 g Oral Daily  . sodium zirconium cyclosilicate  10 g Oral Daily  . tiotropium  18 mcg Inhalation Daily  . warfarin  5 mg Oral ONCE-1800  . Warfarin - Pharmacist Dosing Inpatient   Does not apply q1800   acetaminophen **OR** acetaminophen, ondansetron **OR** ondansetron (ZOFRAN) IV, oxyCODONE, sodium chloride flush  Assessment/ Plan:  57 y.o. female with a PMHx ofleft ventricular thrombus with history of anti-coagulation, CHF, COPD, hypertension, diabetes mellitus type 2, tobacco abuse, metastatic lung cancer, who was admitted to [facility] on9/12/2019for evaluation of hyperkalemia.  1. Acute renal failure, likely related to chemotherapy, poor PO intake. 2. Hyperkalemia, suspect related to [place].  3. Anemia unspecified, likely related to chemo/malginancy. 4. Hypertension.   Plan: Patient has made significant improvement since  admission.  Acute renal failure has resolved as has hyperkalemia's.  Serum potassium currently 3.8 with a creatinine of 0.64.  She will need follow-up in our office to make sure that she does not develop any additional hyperkalemia due to [facility].  Continue the patient on sodium zirconium 10 g p.o. daily for now.  We plan to see the patient back in the office later this coming week.  Thanks for consultation.   LOS: 3 Kyran Whittier 9/15/201912:38 PM

## 2018-04-05 NOTE — Progress Notes (Signed)
ANTICOAGULATION CONSULT NOTE - Initial Consult  Pharmacy Consult for Warfarin dosing Indication: left ventricular apical thrombus following MI  Allergies  Allergen Reactions  . Other Shortness Of Breath    Beta blocker     Patient Measurements: Height: 5\' 4"  (162.6 cm) Weight: 160 lb 11.5 oz (72.9 kg) IBW/kg (Calculated) : 54.7 Heparin Dosing Weight:   Vital Signs: Temp: 98.5 F (36.9 C) (09/15 0832) Temp Source: Oral (09/15 0832) BP: 116/75 (09/15 0832) Pulse Rate: 114 (09/15 0832)  Labs: Recent Labs    04/03/18 0050 04/04/18 0545 04/05/18 0513  HGB 9.3*  --  8.1*  HCT 28.8*  --  24.9*  PLT 554*  --  393  LABPROT 19.9* 20.3* 21.7*  INR 1.71 1.75 1.91  CREATININE 0.61 0.51 0.64    Estimated Creatinine Clearance: 75 mL/min (by C-G formula based on SCr of 0.64 mg/dL).   Medical History: Past Medical History:  Diagnosis Date  . Asthma   . CHF (congestive heart failure) ([place])   . COPD (chronic obstructive pulmonary disease) ([place])   . Diabetes mellitus without complication ([place])   . Hypercholesteremia   . Hypertension   . Lung cancer ([place])   . Reflux esophagitis     Medications:  Warfarin 5 mg once daily  Assessment: 57 y.o. Female with history of asthma, CHF, COPD, diabetes type 2, hyperlipidemia, hypertension and diagnosis of lung cancer who was supposed to get her third dose of chemo today.  Blood work showed elevated K and sent to [facility] for admission.  Upon chart review patient has history of left ventricular apical thrombus following MI requiring warfarin.  Per patient she did not take a dose PTA on 09/12. Per patient most recent dose was warfarin 5 mg once daily and according to her resulting in supratherapeutic INR leading recent hold. I did find an INR>10 for 09/04 and she reports taking no doses since that reading. She was admitted here early in March with a supratherapeutic INR but her dose was a 7.5/10 mg combination. She did not get any doses here to  evaluate her response. There are no new significant DDIs.                INR      Warfarin 9/12   1.95         2.5 9/13   1.71         2.5 9/14   1.75        5 mg  9/15   1.91   Goal of Therapy:  INR 2-3 Monitor platelets by anticoagulation protocol: Yes   Plan:  Will order warfarin 5 mg once tonight.  Will check INR with morning labs to reevaluate dose.   Larene Beach, PharmD  04/05/2018,9:59 AM

## 2018-04-05 NOTE — Progress Notes (Signed)
[facility] NAME: Laura Booth    MR#:  [id number]  DATE OF BIRTH:  02-Oct-1960  SUBJECTIVE:   Patient presented with hyperkalemia.  Potassium level has improved, renal function is also improved.  Hyperkalemia is thought to be secondary to her immunotherapy Keytruda.  Patient still complaining of some specific pain which is chronic for her.  No other acute events overnight. Complain of generalized weakness and getting significant shortness of breath with exertion but comfortable while resting.  REVIEW OF SYSTEMS:    Review of Systems  Constitutional: Negative for chills and fever.  HENT: Negative for congestion and tinnitus.   Eyes: Negative for blurred vision and double vision.  Respiratory: Negative for cough, shortness of breath and wheezing.   Cardiovascular: Negative for chest pain, orthopnea and PND.  Gastrointestinal: Negative for abdominal pain, diarrhea, nausea and vomiting.  Genitourinary: Negative for dysuria and hematuria.  Neurological: Negative for dizziness, sensory change and focal weakness.  All other systems reviewed and are negative.   Nutrition: Heart Healthy/Carb control Tolerating Diet: Yes Tolerating PT: Ambulatory  DRUG ALLERGIES:   Allergies  Allergen Reactions  . Other Shortness Of Breath    Beta blocker     VITALS:  Blood pressure 95/62, pulse (!) 104, temperature 98 F (36.7 C), temperature source Oral, resp. rate 18, height 5\' 4"  (1.626 m), weight 72.9 kg, SpO2 97 %.  PHYSICAL EXAMINATION:   Physical Exam  GENERAL:  57 y.o.-year-old patient lying in bed in no acute distress.  EYES: Pupils equal, round, reactive to light and accommodation. No scleral icterus. Extraocular muscles intact.  HEENT: Head atraumatic, normocephalic. Oropharynx and nasopharynx clear.  NECK:  Supple, no jugular venous distention. No thyroid enlargement, no tenderness.  LUNGS: Normal breath sounds bilaterally, no  wheezing, rales, rhonchi. No use of accessory muscles of respiration.  CARDIOVASCULAR: S1, S2 normal. No murmurs, rubs, or gallops.  ABDOMEN: Soft, nontender, nondistended. Bowel sounds present. No organomegaly or mass.  EXTREMITIES: No cyanosis, clubbing or edema b/l.    NEUROLOGIC: Cranial nerves II through XII are intact. No focal Motor or sensory deficits b/l.   PSYCHIATRIC: The patient is alert and oriented x 3.  SKIN: No obvious rash, lesion, or ulcer.    LABORATORY PANEL:   CBC Recent Labs  Lab 04/05/18 0513  WBC 18.0*  HGB 8.1*  HCT 24.9*  PLT 393   ------------------------------------------------------------------------------------------------------------------  Chemistries  Recent Labs  Lab 04/02/18 0821  04/05/18 0513  NA 137   < > 140  K 6.8*   < > 3.8  CL 100   < > 96*  CO2 24   < > 33*  GLUCOSE 164*   < > 117*  BUN 58*   < > 12  CREATININE 1.02*   < > 0.64  CALCIUM 9.8   < > 8.8*  AST 27  --   --   ALT 29  --   --   ALKPHOS 303*  --   --   BILITOT 0.4  --   --    < > = values in this interval not displayed.   ------------------------------------------------------------------------------------------------------------------  Cardiac Enzymes No results for input(s): TROPONINI in the last 168 hours. ------------------------------------------------------------------------------------------------------------------  RADIOLOGY:  Dg Chest 2 View  Result Date: 04/04/2018 CLINICAL DATA:  Hx of lung CA, COPD, CHF, asthma Quit smoking 6-19 EXAM: CHEST - 2 VIEW COMPARISON:  04/02/2018 FINDINGS: Left suprahilar mass, poorly marginated on the frontal radiograph. Persistent  patchy opacities in the mid and upper lung. Right lung clear. Heart size and mediastinal contours are within normal limits. Aortic Atherosclerosis (ICD10-170.0). Power injectable right IJ port catheter to the distal SVC. Blunting of left lateral costophrenic angle without definite effusion on the  lateral radiograph. Visualized bones unremarkable. IMPRESSION: Stable left pulmonary opacities.  No acute findings. Electronically Signed   By: Lucrezia Europe M.D.   On: 04/04/2018 09:43     ASSESSMENT AND PLAN:   57 year old female with past medical history of lung cancer, COPD, hypertension, hyperlipidemia, diabetes who presents to the hospital due to severe hyperkalemia  1.  Hyperkalemia- patient presented to the hospital with a potassium of 6.7.   thought to be secondary to acute kidney injury along with patient being on Keytruda. - appreciate nephro input and cont. Lokelma.  Potasium level down to 4.5 - 3,8 today.  - cont. Fluids, Lokelma for now.  -Nephro suggested to follow in clinic.  2. ARF - due to dehydration.  Much improved with IV fluids and will continue to monitor. -Appreciate nephrology input.  3.  History of left ventricular thrombus-continue Coumadin.  4.  COPD-no acute exacerbation, continue Dulera, Spiriva, duo nebs.  5.  Hyperlipidemia-continue atorvastatin.  6.  GERD-continue Protonix.  7. Hx of Lung Cancer stage IV- follows with Dr. Janese Banks. Currently on Immunotherapy with Keytruda.  - follow up with them as outpatient.  -Patient today told me, she is having significant side effect to this chemotherapy and she is losing appetite and getting generalized weakness due to that. She also told me " I know, I am going to die." I had detailed discussion about her diagnosis of Metastatic cancer, prognosis, side effects of chemo and concept of palliative care and hospice with her. She will like to meet them to make decisions.  8. Tachycardia-   Sinus tachy   TSH was normal a month ago.   Cardizem long acting tabs.  Possible is normal, but have tachycardia, so monitor tonight.   All the records are reviewed and case discussed with Care Management/Social Worker. Management plans discussed with the patient, family and they are in agreement.  CODE STATUS: Full code  DVT  Prophylaxis: Coumadin  TOTAL TIME TAKING CARE OF THIS PATIENT: 40 minutes.   POSSIBLE D/C IN 1-2 DAYS, DEPENDING ON CLINICAL CONDITION.   [name] M.D on 04/05/2018 at 2:16 PM  Between 7am to 6pm - Pager - [phone]  After 6pm go to [email or web address] - Proofreader  [facility]  Office  [phone]  CC: Primary care physician; Juluis Pitch, MD

## 2018-04-06 ENCOUNTER — Inpatient Hospital Stay: Payer: Commercial Managed Care - PPO

## 2018-04-06 ENCOUNTER — Encounter: Payer: Self-pay | Admitting: Radiology

## 2018-04-06 DIAGNOSIS — E875 Hyperkalemia: Principal | ICD-10-CM

## 2018-04-06 DIAGNOSIS — Z515 Encounter for palliative care: Secondary | ICD-10-CM

## 2018-04-06 DIAGNOSIS — Z66 Do not resuscitate: Secondary | ICD-10-CM

## 2018-04-06 DIAGNOSIS — R0902 Hypoxemia: Secondary | ICD-10-CM

## 2018-04-06 DIAGNOSIS — R0602 Shortness of breath: Secondary | ICD-10-CM

## 2018-04-06 DIAGNOSIS — C3490 Malignant neoplasm of unspecified part of unspecified bronchus or lung: Secondary | ICD-10-CM

## 2018-04-06 DIAGNOSIS — Z7189 Other specified counseling: Secondary | ICD-10-CM

## 2018-04-06 LAB — PROTIME-INR
INR: 2
PROTHROMBIN TIME: 22.5 s — AB (ref 11.4–15.2)

## 2018-04-06 MED ORDER — IOPAMIDOL (ISOVUE-300) INJECTION 61%
15.0000 mL | INTRAVENOUS | Status: AC
  Administered 2018-04-06 (×2): 15 mL via ORAL

## 2018-04-06 MED ORDER — DILTIAZEM HCL ER COATED BEADS 180 MG PO CP24
180.0000 mg | ORAL_CAPSULE | Freq: Every day | ORAL | Status: DC
  Administered 2018-04-06: 10:00:00 180 mg via ORAL
  Filled 2018-04-06: qty 1

## 2018-04-06 MED ORDER — TRAMADOL HCL 50 MG PO TABS
50.0000 mg | ORAL_TABLET | Freq: Four times a day (QID) | ORAL | Status: DC | PRN
  Administered 2018-04-06: 17:00:00 50 mg via ORAL
  Filled 2018-04-06: qty 1

## 2018-04-06 MED ORDER — SODIUM ZIRCONIUM CYCLOSILICATE 10 G PO PACK: 10.0000 g | PACK | Freq: Every day | ORAL | 0 refills | Status: AC

## 2018-04-06 MED ORDER — NEPRO/CARBSTEADY PO LIQD: 237.0000 mL | Freq: Three times a day (TID) | ORAL | 0 refills | Status: AC

## 2018-04-06 MED ORDER — WARFARIN SODIUM 5 MG PO TABS
5.0000 mg | ORAL_TABLET | Freq: Once | ORAL | Status: AC
  Administered 2018-04-06: 5 mg via ORAL
  Filled 2018-04-06: qty 1

## 2018-04-06 MED ORDER — HEPARIN SOD (PORK) LOCK FLUSH 100 UNIT/ML IV SOLN
500.0000 [IU] | Freq: Once | INTRAVENOUS | Status: AC
  Administered 2018-04-06: 500 [IU] via INTRAVENOUS
  Filled 2018-04-06: qty 5

## 2018-04-06 MED ORDER — DILTIAZEM HCL ER COATED BEADS 180 MG PO CP24: 180.0000 mg | ORAL_CAPSULE | Freq: Every day | ORAL | 0 refills | Status: AC

## 2018-04-06 MED ORDER — OXYCODONE HCL ER 10 MG PO T12A: 10.0000 mg | EXTENDED_RELEASE_TABLET | Freq: Two times a day (BID) | ORAL | 0 refills | Status: AC

## 2018-04-06 MED ORDER — IOPAMIDOL (ISOVUE-300) INJECTION 61%
100.0000 mL | Freq: Once | INTRAVENOUS | Status: AC | PRN
  Administered 2018-04-06: 12:00:00 100 mL via INTRAVENOUS

## 2018-04-06 NOTE — Discharge Instructions (Signed)
Palliative care nurse to follow after discharge.

## 2018-04-06 NOTE — Consult Note (Signed)
Consultation Note Date: 04/06/2018   Patient Name: Laura Booth  DOB: 1961/05/30  MRN: [id number]  Age / Sex: 57 y.o., female  PCP: Juluis Pitch, MD Referring Physician: [name], *  Reason for Consultation: Establishing goals of care  HPI/Patient Profile: 57 y.o. female admitted on 04/02/2018 from home.  Patient was seen at the cancer center in preparation for her third treatment of Keytruda, but unfortunately her blood work showed that her potassium was extremely high and she was sent over for evaluation and possible admission.  Patient has a significant medical history of asthma, CHF, COPD, diabetes, hypertension, and stage IV lung cancer with liver, lymph node, and bone metastasis.  Patient's potassium was 6.1 and thought to be related to her chemo treatment and AKI as per oncology.  Since admission patient has received Lokelma for hyperkalemia, IV fluids for dehydration, and continued on home medications for COPD and GERD. She was seen by Oncology and Nephrology. Palliative Medicine consulted for goals of care discussion.   Clinical Assessment and Goals of Care: I have reviewed medical records including lab results, imaging, Epic notes, and MAR, received report from the bedside RN, and assessed the patient. I then met at the bedside with patient, her daughter, and 3 sisters to discuss diagnosis prognosis, GOC, EOL wishes, disposition and options.  She is alert and oriented x3.  She was able to engage in goals of care discussion with her family and I.  I introduced Palliative Medicine as specialized medical care for people living with serious illness. It focuses on providing relief from the symptoms and stress of a serious illness. The goal is to improve quality of life for both the patient and the family.  As far as functional and nutritional status she reports she was able to perform all  ADLs prior to admission with some fatigue and generalized weakness.  She states after receiving chemo treatments she is generally more weaker than usual and would often sleep.  She also reports a decline in her appetite due to treatments.  She states she tries to drink at least 3 of 4 protein shakes a day if she is unable to tolerate meals she reports that she ambulates with a cane for stability.  She does have some chronic pain in which she states she takes Tylenol and oxycodone for management.  We discussed her current illness and what it means in the larger context of her on-going co-morbidities.  Natural disease trajectory and expectations at EOL were discussed.  Both patient and family verbalized understanding of her current illness and ongoing comorbidities.  Patient remains hopeful for some stability but is also prepared for the worse.  She reports that she is hopeful with her current chemotherapy treatment this will allow her some additional time on earth and with her family.  However she knows that ultimately she will pass away due to her cancer.  We discussed in detail how she feels during her cancer treatments and although she states she does not feel the best she  has discussed with her daughter and she does not want to discontinue treatments and possibly shorten her life span.  She does reports the quality matters to her but at this point she would like to enjoy every moment with her daughter and sisters even if that requires taking treatment with hopes of adding more days than none.  I attempted to elicit values and goals of care important to the patient.    The difference between aggressive medical intervention and comfort care was considered in light of the patient's goals of care.  Patient reports she would like to continue with her current medical treatments such as chemotherapy until she is told that it is no longer working or her cancer has progressed despite every effort to slow the  progression.  Advanced directives, concepts specific to code status, artifical feeding and hydration, and rehospitalization were considered and discussed.  Discussed if patient had any advanced directives and her current CODE STATUS which is a full code.  Patient verbalizes that her daughter is aware of her wishes in the event that she is unable to express them.  Patient states she is not interested in any forms of artificial feedings such as PEG tube.  We discussed her full CODE STATUS.  We discussed what a potential code could look like in the event of a cardiac or respiratory arrest including patient's current illness and comorbidities.  Patient verbalized her and her daughter has discussed this previously and at this time she is requested to be a DNR/DNI.  Daughter verbalized understanding and agreement.  Patient states she is nave that she will eventually pass away and knows that CPR or other life-sustaining measures would not provide her any better quality of life but only worsen.  Support was given.  Palliative Care services outpatient were explained and offered.  Given patient's goal to continue with chemotherapy and other medical interventions we discussed outpatient palliative care services.  Patient and daughter verbalized understanding and has request for outpatient palliative services at discharge.  Questions and concerns were addressed. The family was encouraged to call with questions or concerns.  PMT will continue to support holistically.  Patient is alert and oriented x3.  She is capable of making her own medical decisions at this time, however patient does verbalize that in the event she is unable to make decisions that her daughter, would be her assigned decision-maker. NEXT OF KIN    SUMMARY OF RECOMMENDATIONS    DNR/DNI-as requested by patient/daughter  Continue to treat the treatable while hospitalized.  Patient verbalizes that her current pain regimen is controlling pain  effectively.  No changes needed.  Patient and daughter has verbalized they would like outpatient palliative services at discharge.  Case management referral for outpatient palliative services once discharged.  Palliative medicine team will continue to support patient, family, and medical team during hospitalization as needed.  Code Status/Advance Care Planning:  DNR/DNI   Palliative Prophylaxis:   Bowel Regimen and Frequent Pain Assessment  Additional Recommendations (Limitations, Scope, Preferences):  Full Scope Treatment  Psycho-social/Spiritual:   Desire for further Chaplaincy support:no  Prognosis:   Unable to determine-guarded to poor in the setting of asthma, CHF, diabetes, COPD, hypertension, stage IV metastatic lung cancer, decreased mobility, protein calorie malnutrition, and poor p.o. intake.  Discharge Planning: To Be Determined outpatient palliative services     Primary Diagnoses: Present on Admission: . Hyperkalemia   I have reviewed the medical record, interviewed the patient and family, and examined the patient. The  following aspects are pertinent.  Past Medical History:  Diagnosis Date  . Asthma   . CHF (congestive heart failure) ([place])   . COPD (chronic obstructive pulmonary disease) ([place])   . Diabetes mellitus without complication ([place])   . Hypercholesteremia   . Hypertension   . Lung cancer ([place])   . Reflux esophagitis    Social History   Socioeconomic History  . Marital status: Single    Spouse name: Not on file  . Number of children: Not on file  . Years of education: Not on file  . Highest education level: Not on file  Occupational History  . Not on file  Social Needs  . Financial resource strain: Not hard at all  . Food insecurity:    Worry: Never true    Inability: Never true  . Transportation needs:    Medical: No    Non-medical: No  Tobacco Use  . Smoking status: Former Smoker    Last attempt to quit: 01/12/2018    Years  since quitting: 0.2  . Smokeless tobacco: Never Used  Substance and Sexual Activity  . Alcohol use: Yes    Frequency: Never    Comment: socially  . Drug use: No  . Sexual activity: Not Currently    Birth control/protection: Abstinence  Lifestyle  . Physical activity:    Days per week: 0 days    Minutes per session: 0 min  . Stress: Not on file  Relationships  . Social connections:    Talks on phone: More than three times a week    Gets together: More than three times a week    Attends religious service: Patient refused    Active member of club or organization: No    Attends meetings of clubs or organizations: Never    Relationship status: Never married  Other Topics Concern  . Not on file  Social History Narrative  . Not on file   Family History  Problem Relation Age of Onset  . Diabetes Mother    Scheduled Meds: . albuterol  10 mg Nebulization Once  . atorvastatin  40 mg Oral q1800  . diltiazem  180 mg Oral Daily  . feeding supplement (NEPRO CARB STEADY)  237 mL Oral TID BM  . furosemide  20 mg Intravenous Q12H  . ipratropium-albuterol  3 mL Nebulization Q4H  . loratadine  10 mg Oral Daily  . mometasone-formoterol  2 puff Inhalation BID  . montelukast  10 mg Oral Daily  . oxyCODONE  10 mg Oral Q12H  . pantoprazole  40 mg Oral Daily  . polyethylene glycol  17 g Oral Daily  . tiotropium  18 mcg Inhalation Daily  . warfarin  5 mg Oral ONCE-1800  . Warfarin - Pharmacist Dosing Inpatient   Does not apply q1800   Continuous Infusions: PRN Meds:.acetaminophen **OR** acetaminophen, ondansetron **OR** ondansetron (ZOFRAN) IV, oxyCODONE, sodium chloride flush Medications Prior to Admission:  Prior to Admission medications   Medication Sig Start Date End Date Taking? Authorizing Provider  folic acid (FOLVITE) 1 MG tablet Take 1 mg by mouth daily.   Yes [provider]  lisinopril (PRINIVIL,ZESTRIL) 40 MG tablet Take 40 mg by mouth daily.   Yes [provider]  PROAIR HFA 108 (90 Base) MCG/ACT inhaler Inhale 2 puffs into the lungs 4 (four) times daily as needed. Patient taking differently: Inhale 2 puffs into the lungs every 6 (six) hours as needed.  09/22/17  Yes [name], MD  acetaminophen (  TYLENOL) 500 MG tablet Take 1,000 mg by mouth every 6 (six) hours as needed.    [provider]  ADVAIR DISKUS 500-50 MCG/DOSE AEPB Inhale 1 puff into the lungs every 12 (twelve) hours. 08/25/17   [provider]  atorvastatin (LIPITOR) 40 MG tablet Take 40 mg by mouth daily.     [provider]  dexamethasone (DECADRON) 4 MG tablet Take 1 tab two times a day the day before [place] chemo. Take 2 tabs two times a day starting the day after chemo for 3 days. Patient not taking: Reported on 02/18/2018 02/17/18   Sindy Guadeloupe, MD  fexofenadine (ALLEGRA) 180 MG tablet Take 180 mg by mouth daily.    [provider]  furosemide (LASIX) 20 MG tablet Take 20 mg by mouth. PRN    [provider]  guaiFENesin-dextromethorphan (ROBITUSSIN DM) 100-10 MG/5ML syrup Take 15 mLs by mouth every 4 (four) hours as needed for cough. Patient not taking: Reported on 04/02/2018 09/22/17   [name], MD  ipratropium-albuterol (DUONEB) 0.5-2.5 (3) MG/3ML SOLN Take 3 mLs by nebulization every 4 (four) hours. 09/22/17   [name], MD  lidocaine-prilocaine (EMLA) cream Apply to affected area once Patient not taking: Reported on 02/18/2018 02/17/18   Sindy Guadeloupe, MD  LORazepam (ATIVAN) 0.5 MG tablet Take 1 tablet (0.5 mg total) by mouth every 6 (six) hours as needed (Nausea or vomiting). Patient not taking: Reported on 02/18/2018 02/17/18   Sindy Guadeloupe, MD  magic mouthwash SOLN Take 10 mLs by mouth 3 (three) times daily. 03/31/18   Sindy Guadeloupe, MD  metFORMIN (GLUCOPHAGE-XR) 750 MG 24 hr tablet Take 1 tablet by mouth 2 (two) times daily. 09/06/17   [provider]  montelukast (SINGULAIR) 10 MG  tablet Take 10 mg by mouth daily. 09/08/17   [provider]  nicotine (NICODERM CQ - DOSED IN MG/24 HOURS) 21 mg/24hr patch Place 1 patch (21 mg total) onto the skin daily. Patient not taking: Reported on 03/03/2018 09/23/17   [name], MD  omeprazole (PRILOSEC) 20 MG capsule Take 20 mg by mouth daily.    [provider]  ondansetron (ZOFRAN) 8 MG tablet Take 1 tablet (8 mg total) by mouth every 8 (eight) hours as needed. Patient not taking: Reported on 02/18/2018 02/17/18   Sindy Guadeloupe, MD  oxyCODONE (OXY IR/ROXICODONE) 5 MG immediate release tablet Take 1 tablet (5 mg total) by mouth every 6 (six) hours as needed for severe pain. 03/31/18   Sindy Guadeloupe, MD  oxyCODONE (OXYCONTIN) 10 mg 12 hr tablet Take 1 tablet (10 mg total) by mouth every 12 (twelve) hours. 04/02/18   Sindy Guadeloupe, MD  polyethylene glycol powder (GLYCOLAX/MIRALAX) powder Take 17 g by mouth daily. Patient not taking: Reported on 04/02/2018 03/18/18   Sindy Guadeloupe, MD  predniSONE (DELTASONE) 10 MG tablet 60 mg X1 followed by 50 mg X1, 40 mg X1, 30 mg X1, 20 mg X1, 10 mg X1 then stop 02/27/18   Sindy Guadeloupe, MD  prochlorperazine (COMPAZINE) 10 MG tablet Take 1 tablet (10 mg total) by mouth every 6 (six) hours as needed (Nausea or vomiting). Patient not taking: Reported on 02/18/2018 02/17/18   Sindy Guadeloupe, MD  prochlorperazine (COMPAZINE) 25 MG suppository Place 1 suppository (25 mg total) rectally every 12 (twelve) hours as needed for nausea. Patient not taking: Reported on 02/18/2018 02/17/18   Sindy Guadeloupe, MD  SPIRIVA HANDIHALER 18 MCG inhalation capsule  Place 1 puff into inhaler and inhale daily. 08/25/17   [provider]  warfarin (COUMADIN) 6 MG tablet Take 1 tablet (6 mg total) by mouth daily at 6 PM. TAKE 10 MG BY MOUTH ON Monday AND 7.5MG DAILY ALL OTHER DAYS Patient taking differently: Take 5 mg by mouth daily at 6 PM. TAKE 10 MG BY MOUTH ON Monday AND 7.5MG DAILY ALL OTHER DAYS  09/22/17   [name], MD   Allergies  Allergen Reactions  . Other Shortness Of Breath    Beta blocker    Review of Systems  Constitutional: Positive for activity change, appetite change, fatigue and unexpected weight change.  Respiratory: Positive for shortness of breath.   Musculoskeletal: Positive for back pain.  Neurological: Positive for weakness.  All other systems reviewed and are negative.   Physical Exam  Constitutional: She is oriented to person, place, and time. Vital signs are normal. She is cooperative. She has a sickly appearance.  Cardiovascular: Normal rate, regular rhythm, normal heart sounds and normal pulses.  Pulmonary/Chest: Effort normal. She has decreased breath sounds.  Abdominal: Soft. Normal appearance and bowel sounds are normal.  Neurological: She is alert and oriented to person, place, and time.  Skin: Skin is warm, dry and intact.  Psychiatric: She has a normal mood and affect. Her speech is normal and behavior is normal. Judgment and thought content normal. Cognition and memory are normal.  Nursing note reviewed.   Vital Signs: BP 126/73   Pulse (!) 114   Temp 98.5 F (36.9 C) (Oral)   Resp 19   Ht 5' 4"  (1.626 m)   Wt 72.9 kg   SpO2 (!) 84%   BMI 27.59 kg/m  Pain Scale: 0-10 POSS *See Group Information*: S-Acceptable,Sleep, easy to arouse Pain Score: 7    SpO2: SpO2: (!) 84 % O2 Device:SpO2: (!) 84 % O2 Flow Rate: .O2 Flow Rate (L/min): 2 L/min  IO: Intake/output summary:   Intake/Output Summary (Last 24 hours) at 04/06/2018 1437 Last data filed at 04/06/2018 1300 Gross per 24 hour  Intake 960 ml  Output -  Net 960 ml    LBM: Last BM Date: 04/05/18 Baseline Weight: Weight: 72.9 kg Most recent weight: Weight: 72.9 kg     Palliative Assessment/Data: PPS 30%   Time In: 1430 Time Out: 1545 Time Total: 75 min.   Greater than 50%  of this time was spent counseling and coordinating care related to the above assessment  and plan.  Signed by: Alda Lea, AGPCNP-BC Palliative Medicine Team  Phone: [phone] Fax: [phone] Pager: [phone] Amion: Bjorn Pippin    Please contact Palliative Medicine Team phone at [phone] for questions and concerns.  For individual provider: See Shea Evans

## 2018-04-06 NOTE — Discharge Summary (Signed)
[facility] NAME: Laura Booth    MR#:  [id number]  DATE OF BIRTH:  1960-08-21  DATE OF ADMISSION:  04/02/2018 ADMITTING PHYSICIAN: Dustin Flock, MD  DATE OF DISCHARGE: 04/06/2018  PRIMARY CARE PHYSICIAN: Juluis Pitch, MD    ADMISSION DIAGNOSIS:  Hyperkalemia Lung Cancer  DISCHARGE DIAGNOSIS:  Active Problems:   Hyperkalemia   Lung cancer with mets  SECONDARY DIAGNOSIS:   Past Medical History:  Diagnosis Date  . Asthma   . CHF (congestive heart failure) ([place])   . COPD (chronic obstructive pulmonary disease) ([place])   . Diabetes mellitus without complication ([place])   . Hypercholesteremia   . Hypertension   . Lung cancer ([place])   . Reflux esophagitis     HOSPITAL COURSE:   57 year old female with past medical history of lung cancer, COPD, hypertension, hyperlipidemia, diabetes who presents to the hospital due to severe hyperkalemia  1.  Hyperkalemia- patient presented to the hospital with a potassium of 6.7.   thought to be secondary to acute kidney injury along with patient being on Keytruda. - appreciate nephro input and cont. Lokelma.  Potasium level down to 4.5 - 3,8 today.  - cont. Fluids, Lokelma for now.  -Nephro suggested to follow in clinic.  2. ARF - due to dehydration.  Much improved with IV fluids and will continue to monitor. -Appreciate nephrology input.  3.  History of left ventricular thrombus-continue Coumadin.  4.  COPD-no acute exacerbation, continue Dulera, Spiriva, duo nebs.  5.  Hyperlipidemia-continue atorvastatin.  6.  GERD-continue Protonix.  7. Hx of Lung Cancer stage IV- follows with Dr. Janese Banks. Currently on Immunotherapy with Keytruda.  - follow up with them as outpatient.  -Patient today told me, she is having significant side effect to this chemotherapy and she is losing appetite and getting generalized weakness due to that. She also told me " I know, I am going to  die." I had detailed discussion about her diagnosis of Metastatic cancer, prognosis, side effects of chemo and concept of palliative care and hospice with her. She will like to meet them to make decisions.  Oncologist has suggested to do a repeat CT chest abdomen and pelvis to check the progression of her cancer, which showed slight worsening on the lung mass and this explains her persistent shortness of breath, she was advised to continue using supplemental oxygen 24 hours a day instead of using only at nighttime.  During meeting with palliative care, she agreed to have palliative care nurse follow at home after discharge but she want to continue chemotherapy for now.  8. Tachycardia-   Sinus tachy   TSH was normal a month ago.   Cardizem long acting tabs.   Follow with cardio clinic in 1 week.  DISCHARGE CONDITIONS:   Stable.  CONSULTS OBTAINED:  Treatment Team:  Anthonette Legato, MD Sindy Guadeloupe, MD  DRUG ALLERGIES:   Allergies  Allergen Reactions  . Other Shortness Of Breath    Beta blocker     DISCHARGE MEDICATIONS:   Allergies as of 04/06/2018      Reactions   Other Shortness Of Breath   Beta blocker       Medication List    STOP taking these medications   dexamethasone 4 MG tablet Commonly known as:  DECADRON   guaiFENesin-dextromethorphan 100-10 MG/5ML syrup Commonly known as:  ROBITUSSIN DM   lisinopril 40 MG tablet Commonly known as:  PRINIVIL,ZESTRIL   LORazepam  0.5 MG tablet Commonly known as:  ATIVAN   magic mouthwash Soln   metFORMIN 750 MG 24 hr tablet Commonly known as:  GLUCOPHAGE-XR   nicotine 21 mg/24hr patch Commonly known as:  NICODERM CQ - dosed in mg/24 hours   predniSONE 10 MG tablet Commonly known as:  DELTASONE   prochlorperazine 10 MG tablet Commonly known as:  COMPAZINE   prochlorperazine 25 MG suppository Commonly known as:  COMPAZINE     TAKE these medications   acetaminophen 500 MG tablet Commonly known as:   TYLENOL Take 1,000 mg by mouth every 6 (six) hours as needed.   ADVAIR DISKUS 500-50 MCG/DOSE Aepb Generic drug:  Fluticasone-Salmeterol Inhale 1 puff into the lungs every 12 (twelve) hours.   atorvastatin 40 MG tablet Commonly known as:  LIPITOR Take 40 mg by mouth daily.   diltiazem 180 MG 24 hr capsule Commonly known as:  CARDIZEM CD Take 1 capsule (180 mg total) by mouth daily. Start taking on:  04/07/2018   feeding supplement (NEPRO CARB STEADY) Liqd Take 237 mLs by mouth 3 (three) times daily between meals.   fexofenadine 180 MG tablet Commonly known as:  ALLEGRA Take 180 mg by mouth daily.   folic acid 1 MG tablet Commonly known as:  FOLVITE Take 1 mg by mouth daily.   furosemide 20 MG tablet Commonly known as:  LASIX Take 20 mg by mouth. PRN   ipratropium-albuterol 0.5-2.5 (3) MG/3ML Soln Commonly known as:  DUONEB Take 3 mLs by nebulization every 4 (four) hours.   lidocaine-prilocaine cream Commonly known as:  EMLA Apply to affected area once   montelukast 10 MG tablet Commonly known as:  SINGULAIR Take 10 mg by mouth daily.   omeprazole 20 MG capsule Commonly known as:  PRILOSEC Take 20 mg by mouth daily.   ondansetron 8 MG tablet Commonly known as:  ZOFRAN Take 1 tablet (8 mg total) by mouth every 8 (eight) hours as needed.   oxyCODONE 5 MG immediate release tablet Commonly known as:  Oxy IR/ROXICODONE Take 1 tablet (5 mg total) by mouth every 6 (six) hours as needed for severe pain.   oxyCODONE 10 mg 12 hr tablet Commonly known as:  OXYCONTIN Take 1 tablet (10 mg total) by mouth every 12 (twelve) hours.   polyethylene glycol powder powder Commonly known as:  GLYCOLAX/MIRALAX Take 17 g by mouth daily.   PROAIR HFA 108 (90 Base) MCG/ACT inhaler Generic drug:  albuterol Inhale 2 puffs into the lungs 4 (four) times daily as needed. What changed:  when to take this   sodium zirconium cyclosilicate 10 g Pack packet Commonly known as:   LOKELMA Take 10 g by mouth daily.   SPIRIVA HANDIHALER 18 MCG inhalation capsule Generic drug:  tiotropium Place 1 puff into inhaler and inhale daily.   warfarin 6 MG tablet Commonly known as:  COUMADIN Take 1 tablet (6 mg total) by mouth daily at 6 PM. TAKE 10 MG BY MOUTH ON Monday AND 7.5MG  DAILY ALL OTHER DAYS What changed:  how much to take            Durable Medical Equipment  (From admission, onward)         Start     Ordered   04/06/18 1533  DME Oxygen  Once    Comments:  Lung cancer  Question Answer Comment  Mode or (Route) Nasal cannula   Liters per Minute 2   Frequency Continuous (stationary and portable oxygen unit needed)  Oxygen conserving device Yes   Oxygen delivery system Gas      04/06/18 1533           DISCHARGE INSTRUCTIONS:    Palliative care nurse to follow at home after discharge.  If you experience worsening of your admission symptoms, develop shortness of breath, life threatening emergency, suicidal or homicidal thoughts you must seek medical attention immediately by calling 911 or calling your MD immediately  if symptoms less severe.  You Must read complete instructions/literature along with all the possible adverse reactions/side effects for all the Medicines you take and that have been prescribed to you. Take any new Medicines after you have completely understood and accept all the possible adverse reactions/side effects.   Please note  You were cared for by a hospitalist during your hospital stay. If you have any questions about your discharge medications or the care you received while you were in the hospital after you are discharged, you can call the unit and asked to speak with the hospitalist on call if the hospitalist that took care of you is not available. Once you are discharged, your primary care physician will handle any further medical issues. Please note that NO REFILLS for any discharge medications will be authorized once you  are discharged, as it is imperative that you return to your primary care physician (or establish a relationship with a primary care physician if you do not have one) for your aftercare needs so that they can reassess your need for medications and monitor your lab values.    Today   CHIEF COMPLAINT:  No chief complaint on file.   HISTORY OF PRESENT ILLNESS:  Laura Booth  is a 57 y.o. female with a known history of asthma, CHF, COPD, diabetes type 2, hyperlipidemia, hypertension and diagnosis of lung cancer who was supposed to get her third dose of chemo today.  When she had blood work checked showed that her potassium was very high therefore she is sent for admission.  Patient denies any excessive ingestion of potassium containing foods.  She has shortness of breath and complains of pain in her back and her shoulders.  VITAL SIGNS:  Blood pressure 125/80, pulse (!) 105, temperature 98.1 F (36.7 C), resp. rate 18, height 5\' 4"  (1.626 m), weight 72.9 kg, SpO2 94 %.  I/O:    Intake/Output Summary (Last 24 hours) at 04/06/2018 1639 Last data filed at 04/06/2018 1300 Gross per 24 hour  Intake 960 ml  Output -  Net 960 ml    PHYSICAL EXAMINATION:   GENERAL:  57 y.o.-year-old patient lying in bed in no acute distress.  EYES: Pupils equal, round, reactive to light and accommodation. No scleral icterus. Extraocular muscles intact.  HEENT: Head atraumatic, normocephalic. Oropharynx and nasopharynx clear.  NECK:  Supple, no jugular venous distention. No thyroid enlargement, no tenderness.  LUNGS: Normal breath sounds bilaterally, no wheezing, rales, rhonchi. No use of accessory muscles of respiration.  CARDIOVASCULAR: S1, S2 normal. No murmurs, rubs, or gallops.  ABDOMEN: Soft, nontender, nondistended. Bowel sounds present. No organomegaly or mass.  EXTREMITIES: No cyanosis, clubbing or edema b/l.    NEUROLOGIC: Cranial nerves II through XII are intact. No focal Motor or sensory deficits  b/l.   PSYCHIATRIC: The patient is alert and oriented x 3.  SKIN: No obvious rash, lesion, or ulcer.    DATA REVIEW:   CBC Recent Labs  Lab 04/05/18 0513  WBC 18.0*  HGB 8.1*  HCT 24.9*  PLT 393  Chemistries  Recent Labs  Lab 04/02/18 0821  04/05/18 0513  NA 137   < > 140  K 6.8*   < > 3.8  CL 100   < > 96*  CO2 24   < > 33*  GLUCOSE 164*   < > 117*  BUN 58*   < > 12  CREATININE 1.02*   < > 0.64  CALCIUM 9.8   < > 8.8*  AST 27  --   --   ALT 29  --   --   ALKPHOS 303*  --   --   BILITOT 0.4  --   --    < > = values in this interval not displayed.    Cardiac Enzymes No results for input(s): TROPONINI in the last 168 hours.  Microbiology Results  Results for orders placed or performed during the hospital encounter of 09/19/17  Blood culture (routine x 2)     Status: None   Collection Time: 09/19/17 11:14 AM  Result Value Ref Range Status   Specimen Description BLOOD RIGHT ANTECUBITAL  Final   Special Requests   Final    BOTTLES DRAWN AEROBIC AND ANAEROBIC Blood Culture results may not be optimal due to an excessive volume of blood received in culture bottles   Culture   Final    NO GROWTH 5 DAYS Performed at [facility], [address] [place], [place] [address]    Report Status 09/24/2017 FINAL  Final  Blood culture (routine x 2)     Status: None   Collection Time: 09/19/17 11:14 AM  Result Value Ref Range Status   Specimen Description BLOOD BLOOD RIGHT HAND  Final   Special Requests   Final    BOTTLES DRAWN AEROBIC AND ANAEROBIC Blood Culture results may not be optimal due to an excessive volume of blood received in culture bottles   Culture   Final    NO GROWTH 5 DAYS Performed at [facility], [address] [place], [place] [address]    Report Status 09/24/2017 FINAL  Final    RADIOLOGY:  Ct Chest W Contrast  Result Date: 04/06/2018 CLINICAL DATA:  Stage IV lung cancer with hepatic and osseous metastatic disease.  Restaging post Keytruda treated therapy. Hyperkalemia. EXAM: CT CHEST, ABDOMEN, AND PELVIS WITH CONTRAST TECHNIQUE: Multidetector CT imaging of the chest, abdomen and pelvis was performed following the standard protocol during bolus administration of intravenous contrast. CONTRAST:  121mL ISOVUE-300 IOPAMIDOL (ISOVUE-300) INJECTION 61% COMPARISON:  PET-CT 01/28/2018.  Chest CT 01/20/2018. FINDINGS: CT CHEST FINDINGS Cardiovascular: Suboptimal contrast bolus. No acute vascular findings are evident. There is atherosclerosis of the aorta, great vessels and coronary arteries. Right IJ Port-A-Cath extends to the level of the superior right atrium. The heart size is normal. There is no pericardial effusion. Mediastinum/Nodes: The previously demonstrated hypermetabolic adenopathy in the AP window and left hilum is partly obscured by adjacent left upper lobe airspace disease, although appears grossly stable. AP window node measures 14 mm on image 22/2. Hilar node measures 15 mm on image 24/2. No progressive thoracic adenopathy identified. Low-density thyroid nodules are stable. The esophagus appears unremarkable. Lungs/Pleura: New trace left pleural effusion. There is new mild dependent atelectasis in the left lower lobe. There is increased airspace disease surrounding the previously demonstrated dominant, hypermetabolic left upper lobe mass. This partially obscures the mass and limits its measurement. There are new central low-density components within the mass consistent with necrosis. There is posterior bowing of the left  major fissure which is similar to the previous study. A perifissural 10 x 7 mm right middle lobe nodule on image 71/4 has not significantly changed. There are other stable smaller right lung nodules, including a 6 mm right lower lobe nodule on image 57/4 and a 6 mm right lower lobe nodule on image 66/4. No new or enlarging nodules identified. There is mild centrilobular emphysema. Musculoskeletal/Chest  wall: Multiple sclerotic metastases are identified within the spine, ribs, sternum and left scapula. These are more visible than on previous studies, likely due to at least partial treatment. No lytic lesion or pathologic fracture identified. There is a 13 mm soft tissue nodule in the right breast (image 32/2) which was mildly hypermetabolic on previous PET-CT. CT ABDOMEN AND PELVIS FINDINGS Hepatobiliary: Multiple ill-defined hypodensities are noted within the liver, most likely due to metastatic disease as correlated with previous PET-CT. The largest lesions measure 15 mm in the right lobe (image 62/2) and 14 mm in the left lobe (image 66/2). The lesions were not well seen on previous CT images, limiting comparison. Other more circumscribed low-density hepatic lesions are stable, likely incidental cysts. No evidence of gallstones, gallbladder wall thickening or biliary dilatation. Pancreas: Unremarkable. No pancreatic ductal dilatation or surrounding inflammatory changes. Spleen: Normal in size without focal abnormality. Adrenals/Urinary Tract: There is an ill-defined 1.6 x 2.5 cm low-density lesion lateral to the upper pole of the right kidney on image 52/2. This abuts the lateral limb of the right adrenal gland, but is probably separate from the adrenal gland. It appears unchanged from the previous studies and was not hypermetabolic on PET-CT. The left adrenal gland appears normal. As above, ill-defined low-density lesion along the upper pole the right kidney, likely a benign renal finding. In the lower pole of the left kidney, there is a stable thinly septated cyst. No hydronephrosis. The bladder appears normal. Stomach/Bowel: No evidence of bowel wall thickening, distention or surrounding inflammatory change. No definite appendiceal thickening seen on the current study. Vascular/Lymphatic: There are stable small abdominal lymph nodes. There is mild aortic and branch vessel atherosclerosis. Reproductive: There  is a large peripherally calcified uterine fibroid, measuring 6.8 cm on image 92/2. A crescentic shaped low-density 2.1 cm lesion along the posterior right aspect of the urethra (image 106/2) did not contain hypermetabolic activity on prior PET-CT, and therefore is not likely a urethral diverticulum. This may reflect an incidental vaginal cyst. Other: Small umbilical hernia containing only fat.  No ascites. Musculoskeletal: Multiple sclerotic osseous metastases within the lumbar spine, pelvis and proximal femurs consistent with at least partially treated metastatic disease. No lytic lesion or pathologic fracture identified. IMPRESSION: 1. Increased left upper lobe consolidation surrounding the dominant lung mass, limiting its measurement. The mass demonstrates low density consistent with necrosis. 2. The previously demonstrated hypermetabolic AP window and left hilar adenopathy is grossly stable in size, also with probable necrosis. 3. Stable indeterminate right lung nodules. 4. Multifocal hepatic metastatic disease is more evident on today CT images, likely due to interval treatment. 5. Likewise, the multifocal sclerotic osseous metastatic disease is more evident, also likely due to interval treatment. 6. Small indeterminate right breast nodule is unchanged in size, but was hypermetabolic on PET-CT. This would be atypical for metastatic disease, although could reflect a benign or malignant primary breast lesion. Attention on follow-up recommended. 7. Ill-defined lesion involving the upper pole the right kidney is grossly stable and not hypermetabolic on prior PET-CT, probably a complex cyst. Electronically Signed   By: Gwyndolyn Saxon  Lin Landsman M.D.   On: 04/06/2018 12:35   Ct Abdomen Pelvis W Contrast  Result Date: 04/06/2018 CLINICAL DATA:  Stage IV lung cancer with hepatic and osseous metastatic disease. Restaging post Keytruda treated therapy. Hyperkalemia. EXAM: CT CHEST, ABDOMEN, AND PELVIS WITH CONTRAST TECHNIQUE:  Multidetector CT imaging of the chest, abdomen and pelvis was performed following the standard protocol during bolus administration of intravenous contrast. CONTRAST:  125mL ISOVUE-300 IOPAMIDOL (ISOVUE-300) INJECTION 61% COMPARISON:  PET-CT 01/28/2018.  Chest CT 01/20/2018. FINDINGS: CT CHEST FINDINGS Cardiovascular: Suboptimal contrast bolus. No acute vascular findings are evident. There is atherosclerosis of the aorta, great vessels and coronary arteries. Right IJ Port-A-Cath extends to the level of the superior right atrium. The heart size is normal. There is no pericardial effusion. Mediastinum/Nodes: The previously demonstrated hypermetabolic adenopathy in the AP window and left hilum is partly obscured by adjacent left upper lobe airspace disease, although appears grossly stable. AP window node measures 14 mm on image 22/2. Hilar node measures 15 mm on image 24/2. No progressive thoracic adenopathy identified. Low-density thyroid nodules are stable. The esophagus appears unremarkable. Lungs/Pleura: New trace left pleural effusion. There is new mild dependent atelectasis in the left lower lobe. There is increased airspace disease surrounding the previously demonstrated dominant, hypermetabolic left upper lobe mass. This partially obscures the mass and limits its measurement. There are new central low-density components within the mass consistent with necrosis. There is posterior bowing of the left major fissure which is similar to the previous study. A perifissural 10 x 7 mm right middle lobe nodule on image 71/4 has not significantly changed. There are other stable smaller right lung nodules, including a 6 mm right lower lobe nodule on image 57/4 and a 6 mm right lower lobe nodule on image 66/4. No new or enlarging nodules identified. There is mild centrilobular emphysema. Musculoskeletal/Chest wall: Multiple sclerotic metastases are identified within the spine, ribs, sternum and left scapula. These are more  visible than on previous studies, likely due to at least partial treatment. No lytic lesion or pathologic fracture identified. There is a 13 mm soft tissue nodule in the right breast (image 32/2) which was mildly hypermetabolic on previous PET-CT. CT ABDOMEN AND PELVIS FINDINGS Hepatobiliary: Multiple ill-defined hypodensities are noted within the liver, most likely due to metastatic disease as correlated with previous PET-CT. The largest lesions measure 15 mm in the right lobe (image 62/2) and 14 mm in the left lobe (image 66/2). The lesions were not well seen on previous CT images, limiting comparison. Other more circumscribed low-density hepatic lesions are stable, likely incidental cysts. No evidence of gallstones, gallbladder wall thickening or biliary dilatation. Pancreas: Unremarkable. No pancreatic ductal dilatation or surrounding inflammatory changes. Spleen: Normal in size without focal abnormality. Adrenals/Urinary Tract: There is an ill-defined 1.6 x 2.5 cm low-density lesion lateral to the upper pole of the right kidney on image 52/2. This abuts the lateral limb of the right adrenal gland, but is probably separate from the adrenal gland. It appears unchanged from the previous studies and was not hypermetabolic on PET-CT. The left adrenal gland appears normal. As above, ill-defined low-density lesion along the upper pole the right kidney, likely a benign renal finding. In the lower pole of the left kidney, there is a stable thinly septated cyst. No hydronephrosis. The bladder appears normal. Stomach/Bowel: No evidence of bowel wall thickening, distention or surrounding inflammatory change. No definite appendiceal thickening seen on the current study. Vascular/Lymphatic: There are stable small abdominal lymph nodes. There  is mild aortic and branch vessel atherosclerosis. Reproductive: There is a large peripherally calcified uterine fibroid, measuring 6.8 cm on image 92/2. A crescentic shaped low-density  2.1 cm lesion along the posterior right aspect of the urethra (image 106/2) did not contain hypermetabolic activity on prior PET-CT, and therefore is not likely a urethral diverticulum. This may reflect an incidental vaginal cyst. Other: Small umbilical hernia containing only fat.  No ascites. Musculoskeletal: Multiple sclerotic osseous metastases within the lumbar spine, pelvis and proximal femurs consistent with at least partially treated metastatic disease. No lytic lesion or pathologic fracture identified. IMPRESSION: 1. Increased left upper lobe consolidation surrounding the dominant lung mass, limiting its measurement. The mass demonstrates low density consistent with necrosis. 2. The previously demonstrated hypermetabolic AP window and left hilar adenopathy is grossly stable in size, also with probable necrosis. 3. Stable indeterminate right lung nodules. 4. Multifocal hepatic metastatic disease is more evident on today CT images, likely due to interval treatment. 5. Likewise, the multifocal sclerotic osseous metastatic disease is more evident, also likely due to interval treatment. 6. Small indeterminate right breast nodule is unchanged in size, but was hypermetabolic on PET-CT. This would be atypical for metastatic disease, although could reflect a benign or malignant primary breast lesion. Attention on follow-up recommended. 7. Ill-defined lesion involving the upper pole the right kidney is grossly stable and not hypermetabolic on prior PET-CT, probably a complex cyst. Electronically Signed   By: Richardean Sale M.D.   On: 04/06/2018 12:35    EKG:   Orders placed or performed during the hospital encounter of 04/02/18  . EKG 12-Lead  . EKG 12-Lead      Management plans discussed with the patient, family and they are in agreement.  CODE STATUS: DNR    Code Status Orders  (From admission, onward)         Start     Ordered   04/06/18 1437  Do not attempt resuscitation (DNR)  Continuous     Question Answer Comment  In the event of cardiac or respiratory ARREST Do not call a "code blue"   In the event of cardiac or respiratory ARREST Do not perform Intubation, CPR, defibrillation or ACLS   In the event of cardiac or respiratory ARREST Use medication by any route, position, wound care, and other measures to relive pain and suffering. May use oxygen, suction and manual treatment of airway obstruction as needed for comfort.      04/06/18 1437        Code Status History    Date Active Date Inactive Code Status Order ID Comments User Context   04/02/2018 1306 04/06/2018 1437 Full Code [id number]  Dustin Flock, MD Inpatient   09/19/2017 1441 09/22/2017 1906 Full Code [id number]  Dustin Flock, MD Inpatient      TOTAL TIME TAKING CARE OF THIS PATIENT: 35 minutes.    [name] M.D on 04/06/2018 at 4:39 PM  Between 7am to 6pm - Pager - [phone]  After 6pm go to [email or web address] - password [email or web address]  Office  [phone]  CC: Primary care physician; Juluis Pitch, MD   Note: This dictation was prepared with Dragon dictation along with smaller phrase technology. Any transcriptional errors that result from this process are unintentional.

## 2018-04-06 NOTE — Progress Notes (Signed)
New referral for outpatient Palliative to follow at home received from Palliative NP Benito Mccreedy. [place] aware. No discharge date at this time. Patient information faxed to referral. Flo Shanks RN, BSN, [facility] [place], hospital liaison [phone]

## 2018-04-06 NOTE — Progress Notes (Signed)
Resumed 2l after neb treatment

## 2018-04-06 NOTE — Progress Notes (Addendum)
ANTICOAGULATION CONSULT NOTE - Initial Consult  Pharmacy Consult for Warfarin dosing Indication: left ventricular apical thrombus following MI  Allergies  Allergen Reactions  . Other Shortness Of Breath    Beta blocker     Patient Measurements: Height: 5\' 4"  (162.6 cm) Weight: 160 lb 11.5 oz (72.9 kg) IBW/kg (Calculated) : 54.7  Vital Signs:    Labs: Recent Labs    04/04/18 0545 04/05/18 0513 04/06/18 0526  HGB  --  8.1*  --   HCT  --  24.9*  --   PLT  --  393  --   LABPROT 20.3* 21.7* 22.5*  INR 1.75 1.91 2.00  CREATININE 0.51 0.64  --     Estimated Creatinine Clearance: 75 mL/min (by C-G formula based on SCr of 0.64 mg/dL).   Medical History: Past Medical History:  Diagnosis Date  . Asthma   . CHF (congestive heart failure) ([place])   . COPD (chronic obstructive pulmonary disease) ([place])   . Diabetes mellitus without complication ([place])   . Hypercholesteremia   . Hypertension   . Lung cancer ([place])   . Reflux esophagitis     Medications:  Warfarin 5 mg once daily  Assessment: 57 y.o. Female with history of asthma, CHF, COPD, diabetes type 2, hyperlipidemia, hypertension and diagnosis of lung cancer who was supposed to get her third dose of chemo on day of admission.  Blood work showed elevated K and sent to [facility] for admission.  Upon chart review patient has history of left ventricular apical thrombus following MI requiring warfarin.  Per patient she did not take a dose PTA on 09/12. Per patient most recent dose was warfarin 5 mg once daily and according to her resulting in supratherapeutic INR leading recent hold. Elevated INR>10 on 09/04 and she reports taking no doses since that reading. She was admitted here early in March with a supratherapeutic INR but her dose was a 7.5/10 mg combination. She did not get any doses here to evaluate her response. There are no new significant DDIs.                INR      Warfarin 9/12   1.95         2.5 mg 9/13   1.71          2.5 mg 9/14   1.75        5 mg  9/15   1.91   5 mg 9/16   2.00  Goal of Therapy:  INR 2-3 Monitor platelets by anticoagulation protocol: Yes   Plan:  As INR is borderline therapeutic with small change in INR, will order warfarin 5 mg PO once tonight.  Will check INR with morning labs to reevaluate dose. Pt may need lower dose at discharge with close INR follow up.   Continue to monitor CBC at least every three days per policy.  Pharmacy will continue to follow.   Rayna Sexton, PharmD, BCPS Clinical Pharmacist 04/06/2018 9:06 AM

## 2018-04-07 ENCOUNTER — Telehealth: Payer: Self-pay | Admitting: *Deleted

## 2018-04-07 NOTE — Telephone Encounter (Signed)
I faxed the ref. For palliative care to hospice

## 2018-04-07 NOTE — Telephone Encounter (Signed)
yes

## 2018-04-07 NOTE — Telephone Encounter (Signed)
Patient discharged from hospital with an order for Palliative Care consult, Asking if Dr Janese Banks will sign orders

## 2018-04-07 NOTE — Telephone Encounter (Signed)
VO called to [place] in Triage

## 2018-04-08 ENCOUNTER — Telehealth: Payer: Self-pay | Admitting: *Deleted

## 2018-04-08 NOTE — Telephone Encounter (Signed)
Pt called in to report is having side effects of shortness of breath and dizziness from taking cardizem which was started while she was in the hospital. States that she was taken off of lisinopril and started on cardizem. Pt requests if could stop cardizem and go back to lisinopril since she tolerated it better. Per Dr. Janese Banks, pt needs to get recommendations from cardiologist. Pt advised to give cardiologist office a call to seek recommendations for further management of cardiac medications. Pt verbalized understanding.

## 2018-04-09 ENCOUNTER — Inpatient Hospital Stay (HOSPITAL_BASED_OUTPATIENT_CLINIC_OR_DEPARTMENT_OTHER): Payer: Commercial Managed Care - PPO | Admitting: Oncology

## 2018-04-09 ENCOUNTER — Inpatient Hospital Stay: Payer: Commercial Managed Care - PPO

## 2018-04-09 ENCOUNTER — Encounter: Payer: Self-pay | Admitting: Oncology

## 2018-04-09 ENCOUNTER — Telehealth: Payer: Self-pay | Admitting: *Deleted

## 2018-04-09 VITALS — BP 110/66 | HR 114 | Temp 97.9°F | Resp 18 | Ht 64.0 in | Wt 162.6 lb

## 2018-04-09 DIAGNOSIS — C3412 Malignant neoplasm of upper lobe, left bronchus or lung: Secondary | ICD-10-CM

## 2018-04-09 DIAGNOSIS — E875 Hyperkalemia: Secondary | ICD-10-CM

## 2018-04-09 DIAGNOSIS — J189 Pneumonia, unspecified organism: Secondary | ICD-10-CM | POA: Diagnosis not present

## 2018-04-09 DIAGNOSIS — J9621 Acute and chronic respiratory failure with hypoxia: Secondary | ICD-10-CM

## 2018-04-09 DIAGNOSIS — G893 Neoplasm related pain (acute) (chronic): Secondary | ICD-10-CM

## 2018-04-09 DIAGNOSIS — Z7189 Other specified counseling: Secondary | ICD-10-CM

## 2018-04-09 LAB — CBC WITH DIFFERENTIAL/PLATELET
BASOS ABS: 0 10*3/uL (ref 0–0.1)
BASOS PCT: 0 %
EOS PCT: 0 %
Eosinophils Absolute: 0 10*3/uL (ref 0–0.7)
HEMATOCRIT: 24.6 % — AB (ref 35.0–47.0)
HEMOGLOBIN: 8.3 g/dL — AB (ref 12.0–16.0)
Lymphocytes Relative: 8 %
Lymphs Abs: 1.3 10*3/uL (ref 1.0–3.6)
MCH: 28.8 pg (ref 26.0–34.0)
MCHC: 33.5 g/dL (ref 32.0–36.0)
MCV: 85.9 fL (ref 80.0–100.0)
Monocytes Absolute: 1.3 10*3/uL — ABNORMAL HIGH (ref 0.2–0.9)
Monocytes Relative: 8 %
NEUTROS PCT: 84 %
NRBC: 4 /100{WBCs} — AB
Neutro Abs: 13.9 10*3/uL — ABNORMAL HIGH (ref 1.4–6.5)
Platelets: 424 10*3/uL (ref 150–440)
RBC: 2.86 MIL/uL — ABNORMAL LOW (ref 3.80–5.20)
RDW: 16.9 % — ABNORMAL HIGH (ref 11.5–14.5)
WBC: 16.5 10*3/uL — ABNORMAL HIGH (ref 4.0–10.5)

## 2018-04-09 LAB — COMPREHENSIVE METABOLIC PANEL
ALBUMIN: 2.1 g/dL — AB (ref 3.5–5.0)
ALK PHOS: 212 U/L — AB (ref 38–126)
ALT: 18 U/L (ref 0–44)
AST: 38 U/L (ref 15–41)
Anion gap: 11 (ref 5–15)
BUN: 24 mg/dL — ABNORMAL HIGH (ref 6–20)
CALCIUM: 8.5 mg/dL — AB (ref 8.9–10.3)
CO2: 35 mmol/L — AB (ref 22–32)
Chloride: 87 mmol/L — ABNORMAL LOW (ref 98–111)
Creatinine, Ser: 0.65 mg/dL (ref 0.44–1.00)
GFR calc non Af Amer: >60 mL/min (ref >60)
Glucose, Bld: 162 mg/dL — ABNORMAL HIGH (ref 70–99)
Potassium: 4.1 mmol/L (ref 3.5–5.1)
SODIUM: 133 mmol/L — AB (ref 135–145)
Total Bilirubin: 0.3 mg/dL (ref 0.3–1.2)
Total Protein: 7.4 g/dL (ref 6.5–8.1)

## 2018-04-09 LAB — PATHOLOGIST SMEAR REVIEW

## 2018-04-09 MED ORDER — AMOXICILLIN-POT CLAVULANATE 875-125 MG PO TABS: 1.0000 | ORAL_TABLET | Freq: Two times a day (BID) | ORAL | 0 refills | Status: DC

## 2018-04-09 MED ORDER — SODIUM CHLORIDE 0.9% FLUSH
10.0000 mL | INTRAVENOUS | Status: AC | PRN
  Administered 2018-04-09: 10 mL via INTRAVENOUS
  Filled 2018-04-09: qty 10

## 2018-04-09 MED ORDER — HEPARIN SOD (PORK) LOCK FLUSH 100 UNIT/ML IV SOLN
500.0000 [IU] | Freq: Once | INTRAVENOUS | Status: AC
  Administered 2018-04-09: 500 [IU] via INTRAVENOUS

## 2018-04-09 NOTE — Progress Notes (Signed)
Patient doesn't report nausea but she was sitting in the waiting room with the trash can sitting beside her. I gave the patient a bag to use if she had to vomit. She was noted with nausea today.

## 2018-04-09 NOTE — Progress Notes (Signed)
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 She smoked about 1-2 pack's per day for about 15 years. She says that currently she has not smoked for the last 1 week. She had been having ongoing symptoms of shortness of breath and was treated with oral antibiotics.  However her shortness of breath did not improve and this led to a chest x-ray followed by a CT scan in July 2019. CT chest showed a 6 x 6.1 x 4.3 cm left upper lobe mass concerning for malignancy along with left hilar and aortopulmonary window adenopathy. 10 mm nodule in the right middle lobe. Multiple sclerotic densities noted in the thoracic spine may represent metastatic disease.  This was followed by a PET/CT scan which showed 8.9 cm left upper lobe mass with an SUV of 16.9 along with adjacent precarinal and mediastinal adenopathy. Multiple hypermetabolic hepatic foci suspicious for metastatic disease. Right middle lobe pulmonary nodule not significantly hypermetabolic. Multiple osseous metastases within the left femoral neck with an SUV of 5.4 and a sclerotic right anterior acetabular region with an SUV of 9.5.  Patient lives alone and is independent of her ADLs and IADLs. She is not on any home oxygen. She does report feeling fatigued and has lost  about 15 pounds over the last 2 months. She reports poor appetite. Reports back pain which is radiating to her bilateral arms as well as radiating to her right lower extremity. She does not have any children and her only close family is her sister.  Ultrasound-guided liver biopsy showed adenocarcinoma consistent with lung origin.TTF1 positiveOmniseqtestingshowed KRAS mutation TMB high. PDL1 IHC 0%  MRI brain showed:IMPRESSION: 1. Moderately motion degraded examination. 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 Largest in RIGHT cerebellum measuring 22 mm. 2. 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 Old cerebellar, old LEFT thalamus and old LEFT caudate small infarcts. 3. Mild chronic small vessel ischemic changes.   Interval history-patient was discharged from the hospital on oral Cardizem and her lisinopril was stopped.  She reported worsening shortness of breath after this regimen was changed and she did get in touch with her cardiology office.  With her back on lisinopril 40 mg and have stopped the Cardizem.  She reports that her breathing is somewhat improved after the medication change.  She does use 2 L of oxygen at baseline.  Her leg swelling is stable.  She denies any fever or productive cough with sputum production.  ECOG PS- 2 Pain scale- 5 Opioid associated constipation- no  Review of systems- Review of Systems  Constitutional: Positive for malaise/fatigue. Negative for chills, fever and weight loss.  HENT: Negative for congestion, ear discharge and nosebleeds.   Eyes: Negative for blurred vision.  Respiratory: Positive for shortness of breath. Negative for cough, hemoptysis, sputum production and wheezing.   Cardiovascular: Positive for leg swelling. Negative for  chest pain, palpitations, orthopnea and claudication.  Gastrointestinal: Negative for abdominal pain, blood in stool, constipation,  diarrhea, heartburn, melena, nausea and vomiting.  Genitourinary: Negative for dysuria, flank pain, frequency, hematuria and urgency.  Musculoskeletal: Negative for back pain, joint pain and myalgias.  Skin: Negative for rash.  Neurological: Negative for dizziness, tingling, focal weakness, seizures, weakness and headaches.  Endo/Heme/Allergies: Does not bruise/bleed easily.  Psychiatric/Behavioral: Negative for depression and suicidal ideas. The patient does not have insomnia.       Allergies  Allergen Reactions  . Other Shortness Of Breath    Beta blocker      Past Medical History:  Diagnosis Date  . Asthma   . CHF (congestive heart failure) ([place])   . COPD (chronic obstructive pulmonary disease) ([place])   . Diabetes mellitus without complication ([place])   . Hypercholesteremia   . Hypertension   . Lung cancer ([place])   . Reflux esophagitis      Past Surgical History:  Procedure Laterality Date  . IR IMAGING GUIDED PORT INSERTION  02/11/2018    Social History   Socioeconomic History  . Marital status: Single    Spouse name: Not on file  . Number of children: Not on file  . Years of education: Not on file  . Highest education level: Not on file  Occupational History  . Not on file  Social Needs  . Financial resource strain: Not hard at all  . Food insecurity:    Worry: Never true    Inability: Never true  . Transportation needs:    Medical: No    Non-medical: No  Tobacco Use  . Smoking status: Former Smoker    Last attempt to quit: 01/12/2018    Years since quitting: 0.2  . Smokeless tobacco: Never Used  Substance and Sexual Activity  . Alcohol use: Yes    Frequency: Never    Comment: socially  . Drug use: No  . Sexual activity: Not Currently    Birth control/protection: Abstinence  Lifestyle  . Physical activity:    Days per week: 0 days    Minutes per session: 0 min  . Stress: Not on file  Relationships  . Social connections:    Talks on phone: More  than three times a week    Gets together: More than three times a week    Attends religious service: Patient refused    Active member of club or organization: No    Attends meetings of clubs or organizations: Never    Relationship status: Never married  . Intimate partner violence:    Fear of current or ex partner: No    Emotionally abused: No    Physically abused: No    Forced sexual activity: No  Other Topics Concern  . Not on file  Social History Narrative  . Not on file    Family History  Problem Relation Age of Onset  . Diabetes Mother      Current Outpatient Medications:  .  ADVAIR DISKUS 500-50 MCG/DOSE AEPB, Inhale 1 puff into the lungs every 12 (twelve) hours., Disp: , Rfl: 0 .  atorvastatin (LIPITOR) 40 MG tablet, Take 40 mg by mouth daily. , Disp: , Rfl:  .  fexofenadine (ALLEGRA) 180 MG tablet, Take 180 mg by mouth daily., Disp: , Rfl:  .  folic acid (FOLVITE) 1 MG tablet, Take 1 mg by mouth daily., Disp: , Rfl:  .  ipratropium-albuterol (DUONEB) 0.5-2.5 (3) MG/3ML SOLN, Take 3 mLs by nebulization  every 4 (four) hours., Disp: 360 mL, Rfl: 0 .  montelukast (SINGULAIR) 10 MG tablet, Take 10 mg by mouth daily., Disp: , Rfl: 3 .  Nutritional Supplements (FEEDING SUPPLEMENT, NEPRO CARB STEADY,) LIQD, Take 237 mLs by mouth 3 (three) times daily between meals., Disp: 1000 mL, Rfl: 0 .  omeprazole (PRILOSEC) 20 MG capsule, Take 20 mg by mouth daily., Disp: , Rfl:  .  oxyCODONE (OXY IR/ROXICODONE) 5 MG immediate release tablet, Take 1 tablet (5 mg total) by mouth every 6 (six) hours as needed for severe pain., Disp: 60 tablet, Rfl: 0 .  oxyCODONE (OXYCONTIN) 10 mg 12 hr tablet, Take 1 tablet (10 mg total) by mouth every 12 (twelve) hours., Disp: 60 tablet, Rfl: 0 .  sodium zirconium cyclosilicate (LOKELMA) 10 g PACK packet, Take 10 g by mouth daily., Disp: 11 each, Rfl: 0 .  warfarin (COUMADIN) 6 MG tablet, Take 1 tablet (6 mg total) by mouth daily at 6 PM. TAKE 10 MG BY MOUTH  ON Monday AND 7.5MG DAILY ALL OTHER DAYS (Patient taking differently: Take 5 mg by mouth daily at 6 PM. TAKE 10 MG BY MOUTH ON Monday AND 7.5MG DAILY ALL OTHER DAYS), Disp: 30 tablet, Rfl: 0 .  acetaminophen (TYLENOL) 500 MG tablet, Take 1,000 mg by mouth every 6 (six) hours as needed., Disp: , Rfl:  .  amoxicillin-clavulanate (AUGMENTIN) 875-125 MG tablet, Take 1 tablet by mouth 2 (two) times daily., Disp: 14 tablet, Rfl: 0 .  diltiazem (CARDIZEM CD) 180 MG 24 hr capsule, Take 1 capsule (180 mg total) by mouth daily. (Patient not taking: Reported on 04/09/2018), Disp: 30 capsule, Rfl: 0 .  furosemide (LASIX) 20 MG tablet, Take 20 mg by mouth. PRN, Disp: , Rfl:  .  lidocaine-prilocaine (EMLA) cream, Apply to affected area once (Patient not taking: Reported on 02/18/2018), Disp: 30 g, Rfl: 3 .  ondansetron (ZOFRAN) 8 MG tablet, Take 1 tablet (8 mg total) by mouth every 8 (eight) hours as needed. (Patient not taking: Reported on 02/18/2018), Disp: 30 tablet, Rfl: 1 .  polyethylene glycol powder (GLYCOLAX/MIRALAX) powder, Take 17 g by mouth daily. (Patient not taking: Reported on 04/02/2018), Disp: 255 g, Rfl: 1 .  PROAIR HFA 108 (90 Base) MCG/ACT inhaler, Inhale 2 puffs into the lungs 4 (four) times daily as needed. (Patient not taking: Reported on 04/09/2018), Disp: 18 g, Rfl: 1 .  SPIRIVA HANDIHALER 18 MCG inhalation capsule, Place 1 puff into inhaler and inhale daily., Disp: , Rfl: 0 No current facility-administered medications for this visit.   Facility-Administered Medications Ordered in Other Visits:  .  heparin lock flush 100 unit/mL, 500 Units, Intravenous, Once, Randa Evens C, MD .  sodium chloride flush (NS) 0.9 % injection 10 mL, 10 mL, Intravenous, PRN, Sindy Guadeloupe, MD, 10 mL at 04/02/18 2202 .  sodium chloride flush (NS) 0.9 % injection 10 mL, 10 mL, Intravenous, PRN, Sindy Guadeloupe, MD, 10 mL at 04/09/18 5427 .  sodium polystyrene (KAYEXALATE) 15 GM/60ML suspension 30 g, 30 g, Oral, Once,  Sindy Guadeloupe, MD  Physical exam:  Vitals:   04/09/18 0935  BP: 110/66  Pulse: (!) 114  Resp: 18  Temp: 97.9 F (36.6 C)  TempSrc: Tympanic  SpO2: 94%  Weight: 162 lb 9.6 oz (73.8 kg)  Height: 5' 4"  (1.626 m)   Physical Exam  Constitutional: She is oriented to person, place, and time.  Appears older than stated age.  Sitting in a wheelchair  HENT:  Head: Normocephalic and atraumatic.  Eyes: Pupils are equal, round, and reactive to light. EOM are normal.  Neck: Normal range of motion.  Cardiovascular: Regular rhythm and normal heart sounds.  tachycardic  Pulmonary/Chest: Effort normal and breath sounds normal.  Abdominal: Soft. Bowel sounds are normal.  Musculoskeletal: She exhibits edema (b/l +2).  Neurological: She is alert and oriented to person, place, and time.  Skin: Skin is warm and dry.     CMP Latest Ref Rng & Units 04/09/2018  Glucose 70 - 99 mg/dL 162(H)  BUN 6 - 20 mg/dL 24(H)  Creatinine 0.44 - 1.00 mg/dL 0.65  Sodium 135 - 145 mmol/L 133(L)  Potassium 3.5 - 5.1 mmol/L 4.1  Chloride 98 - 111 mmol/L 87(L)  CO2 22 - 32 mmol/L 35(H)  Calcium 8.9 - 10.3 mg/dL 8.5(L)  Total Protein 6.5 - 8.1 g/dL 7.4  Total Bilirubin 0.3 - 1.2 mg/dL 0.3  Alkaline Phos 38 - 126 U/L 212(H)  AST 15 - 41 U/L 38  ALT 0 - 44 U/L 18   CBC Latest Ref Rng & Units 04/09/2018  WBC 4.0 - 10.5 K/uL PENDING  Hemoglobin 12.0 - 16.0 g/dL 8.3(L)  Hematocrit 35.0 - 47.0 % 24.6(L)  Platelets 150 - 440 K/uL 424    No images are attached to the encounter.  Dg Chest 2 View  Result Date: 04/04/2018 CLINICAL DATA:  Hx of lung CA, COPD, CHF, asthma Quit smoking 6-19 EXAM: CHEST - 2 VIEW COMPARISON:  04/02/2018 FINDINGS: Left suprahilar mass, poorly marginated on the frontal radiograph. Persistent patchy opacities in the mid and upper lung. Right lung clear. Heart size and mediastinal contours are within normal limits. Aortic Atherosclerosis (ICD10-170.0). Power injectable right IJ port catheter  to the distal SVC. Blunting of left lateral costophrenic angle without definite effusion on the lateral radiograph. Visualized bones unremarkable. IMPRESSION: Stable left pulmonary opacities.  No acute findings. Electronically Signed   By: Lucrezia Europe M.D.   On: 04/04/2018 09:43   Ct Chest W Contrast  Result Date: 04/06/2018 CLINICAL DATA:  Stage IV lung cancer with hepatic and osseous metastatic disease. Restaging post Keytruda treated therapy. Hyperkalemia. EXAM: CT CHEST, ABDOMEN, AND PELVIS WITH CONTRAST TECHNIQUE: Multidetector CT imaging of the chest, abdomen and pelvis was performed following the standard protocol during bolus administration of intravenous contrast. CONTRAST:  151m ISOVUE-300 IOPAMIDOL (ISOVUE-300) INJECTION 61% COMPARISON:  PET-CT 01/28/2018.  Chest CT 01/20/2018. FINDINGS: CT CHEST FINDINGS Cardiovascular: Suboptimal contrast bolus. No acute vascular findings are evident. There is atherosclerosis of the aorta, great vessels and coronary arteries. Right IJ Port-A-Cath extends to the level of the superior right atrium. The heart size is normal. There is no pericardial effusion. Mediastinum/Nodes: The previously demonstrated hypermetabolic adenopathy in the AP window and left hilum is partly obscured by adjacent left upper lobe airspace disease, although appears grossly stable. AP window node measures 14 mm on image 22/2. Hilar node measures 15 mm on image 24/2. No progressive thoracic adenopathy identified. Low-density thyroid nodules are stable. The esophagus appears unremarkable. Lungs/Pleura: New trace left pleural effusion. There is new mild dependent atelectasis in the left lower lobe. There is increased airspace disease surrounding the previously demonstrated dominant, hypermetabolic left upper lobe mass. This partially obscures the mass and limits its measurement. There are new central low-density components within the mass consistent with necrosis. There is posterior bowing of the  left major fissure which is similar to the previous study. A perifissural 10 x 7 mm right middle lobe nodule  on image 71/4 has not significantly changed. There are other stable smaller right lung nodules, including a 6 mm right lower lobe nodule on image 57/4 and a 6 mm right lower lobe nodule on image 66/4. No new or enlarging nodules identified. There is mild centrilobular emphysema. Musculoskeletal/Chest wall: Multiple sclerotic metastases are identified within the spine, ribs, sternum and left scapula. These are more visible than on previous studies, likely due to at least partial treatment. No lytic lesion or pathologic fracture identified. There is a 13 mm soft tissue nodule in the right breast (image 32/2) which was mildly hypermetabolic on previous PET-CT. CT ABDOMEN AND PELVIS FINDINGS Hepatobiliary: Multiple ill-defined hypodensities are noted within the liver, most likely due to metastatic disease as correlated with previous PET-CT. The largest lesions measure 15 mm in the right lobe (image 62/2) and 14 mm in the left lobe (image 66/2). The lesions were not well seen on previous CT images, limiting comparison. Other more circumscribed low-density hepatic lesions are stable, likely incidental cysts. No evidence of gallstones, gallbladder wall thickening or biliary dilatation. Pancreas: Unremarkable. No pancreatic ductal dilatation or surrounding inflammatory changes. Spleen: Normal in size without focal abnormality. Adrenals/Urinary Tract: There is an ill-defined 1.6 x 2.5 cm low-density lesion lateral to the upper pole of the right kidney on image 52/2. This abuts the lateral limb of the right adrenal gland, but is probably separate from the adrenal gland. It appears unchanged from the previous studies and was not hypermetabolic on PET-CT. The left adrenal gland appears normal. As above, ill-defined low-density lesion along the upper pole the right kidney, likely a benign renal finding. In the lower pole  of the left kidney, there is a stable thinly septated cyst. No hydronephrosis. The bladder appears normal. Stomach/Bowel: No evidence of bowel wall thickening, distention or surrounding inflammatory change. No definite appendiceal thickening seen on the current study. Vascular/Lymphatic: There are stable small abdominal lymph nodes. There is mild aortic and branch vessel atherosclerosis. Reproductive: There is a large peripherally calcified uterine fibroid, measuring 6.8 cm on image 92/2. A crescentic shaped low-density 2.1 cm lesion along the posterior right aspect of the urethra (image 106/2) did not contain hypermetabolic activity on prior PET-CT, and therefore is not likely a urethral diverticulum. This may reflect an incidental vaginal cyst. Other: Small umbilical hernia containing only fat.  No ascites. Musculoskeletal: Multiple sclerotic osseous metastases within the lumbar spine, pelvis and proximal femurs consistent with at least partially treated metastatic disease. No lytic lesion or pathologic fracture identified. IMPRESSION: 1. Increased left upper lobe consolidation surrounding the dominant lung mass, limiting its measurement. The mass demonstrates low density consistent with necrosis. 2. The previously demonstrated hypermetabolic AP window and left hilar adenopathy is grossly stable in size, also with probable necrosis. 3. Stable indeterminate right lung nodules. 4. Multifocal hepatic metastatic disease is more evident on today CT images, likely due to interval treatment. 5. Likewise, the multifocal sclerotic osseous metastatic disease is more evident, also likely due to interval treatment. 6. Small indeterminate right breast nodule is unchanged in size, but was hypermetabolic on PET-CT. This would be atypical for metastatic disease, although could reflect a benign or malignant primary breast lesion. Attention on follow-up recommended. 7. Ill-defined lesion involving the upper pole the right kidney is  grossly stable and not hypermetabolic on prior PET-CT, probably a complex cyst. Electronically Signed   By: Richardean Sale M.D.   On: 04/06/2018 12:35   Ct Abdomen Pelvis W Contrast  Result Date: 04/06/2018  CLINICAL DATA:  Stage IV lung cancer with hepatic and osseous metastatic disease. Restaging post Keytruda treated therapy. Hyperkalemia. EXAM: CT CHEST, ABDOMEN, AND PELVIS WITH CONTRAST TECHNIQUE: Multidetector CT imaging of the chest, abdomen and pelvis was performed following the standard protocol during bolus administration of intravenous contrast. CONTRAST:  156m ISOVUE-300 IOPAMIDOL (ISOVUE-300) INJECTION 61% COMPARISON:  PET-CT 01/28/2018.  Chest CT 01/20/2018. FINDINGS: CT CHEST FINDINGS Cardiovascular: Suboptimal contrast bolus. No acute vascular findings are evident. There is atherosclerosis of the aorta, great vessels and coronary arteries. Right IJ Port-A-Cath extends to the level of the superior right atrium. The heart size is normal. There is no pericardial effusion. Mediastinum/Nodes: The previously demonstrated hypermetabolic adenopathy in the AP window and left hilum is partly obscured by adjacent left upper lobe airspace disease, although appears grossly stable. AP window node measures 14 mm on image 22/2. Hilar node measures 15 mm on image 24/2. No progressive thoracic adenopathy identified. Low-density thyroid nodules are stable. The esophagus appears unremarkable. Lungs/Pleura: New trace left pleural effusion. There is new mild dependent atelectasis in the left lower lobe. There is increased airspace disease surrounding the previously demonstrated dominant, hypermetabolic left upper lobe mass. This partially obscures the mass and limits its measurement. There are new central low-density components within the mass consistent with necrosis. There is posterior bowing of the left major fissure which is similar to the previous study. A perifissural 10 x 7 mm right middle lobe nodule on image  71/4 has not significantly changed. There are other stable smaller right lung nodules, including a 6 mm right lower lobe nodule on image 57/4 and a 6 mm right lower lobe nodule on image 66/4. No new or enlarging nodules identified. There is mild centrilobular emphysema. Musculoskeletal/Chest wall: Multiple sclerotic metastases are identified within the spine, ribs, sternum and left scapula. These are more visible than on previous studies, likely due to at least partial treatment. No lytic lesion or pathologic fracture identified. There is a 13 mm soft tissue nodule in the right breast (image 32/2) which was mildly hypermetabolic on previous PET-CT. CT ABDOMEN AND PELVIS FINDINGS Hepatobiliary: Multiple ill-defined hypodensities are noted within the liver, most likely due to metastatic disease as correlated with previous PET-CT. The largest lesions measure 15 mm in the right lobe (image 62/2) and 14 mm in the left lobe (image 66/2). The lesions were not well seen on previous CT images, limiting comparison. Other more circumscribed low-density hepatic lesions are stable, likely incidental cysts. No evidence of gallstones, gallbladder wall thickening or biliary dilatation. Pancreas: Unremarkable. No pancreatic ductal dilatation or surrounding inflammatory changes. Spleen: Normal in size without focal abnormality. Adrenals/Urinary Tract: There is an ill-defined 1.6 x 2.5 cm low-density lesion lateral to the upper pole of the right kidney on image 52/2. This abuts the lateral limb of the right adrenal gland, but is probably separate from the adrenal gland. It appears unchanged from the previous studies and was not hypermetabolic on PET-CT. The left adrenal gland appears normal. As above, ill-defined low-density lesion along the upper pole the right kidney, likely a benign renal finding. In the lower pole of the left kidney, there is a stable thinly septated cyst. No hydronephrosis. The bladder appears normal.  Stomach/Bowel: No evidence of bowel wall thickening, distention or surrounding inflammatory change. No definite appendiceal thickening seen on the current study. Vascular/Lymphatic: There are stable small abdominal lymph nodes. There is mild aortic and branch vessel atherosclerosis. Reproductive: There is a large peripherally calcified uterine fibroid, measuring 6.8  cm on image 92/2. A crescentic shaped low-density 2.1 cm lesion along the posterior right aspect of the urethra (image 106/2) did not contain hypermetabolic activity on prior PET-CT, and therefore is not likely a urethral diverticulum. This may reflect an incidental vaginal cyst. Other: Small umbilical hernia containing only fat.  No ascites. Musculoskeletal: Multiple sclerotic osseous metastases within the lumbar spine, pelvis and proximal femurs consistent with at least partially treated metastatic disease. No lytic lesion or pathologic fracture identified. IMPRESSION: 1. Increased left upper lobe consolidation surrounding the dominant lung mass, limiting its measurement. The mass demonstrates low density consistent with necrosis. 2. The previously demonstrated hypermetabolic AP window and left hilar adenopathy is grossly stable in size, also with probable necrosis. 3. Stable indeterminate right lung nodules. 4. Multifocal hepatic metastatic disease is more evident on today CT images, likely due to interval treatment. 5. Likewise, the multifocal sclerotic osseous metastatic disease is more evident, also likely due to interval treatment. 6. Small indeterminate right breast nodule is unchanged in size, but was hypermetabolic on PET-CT. This would be atypical for metastatic disease, although could reflect a benign or malignant primary breast lesion. Attention on follow-up recommended. 7. Ill-defined lesion involving the upper pole the right kidney is grossly stable and not hypermetabolic on prior PET-CT, probably a complex cyst. Electronically Signed    By: Richardean Sale M.D.   On: 04/06/2018 12:35   Dg Chest Port 1 View  Result Date: 04/02/2018 CLINICAL DATA:  Shortness of breath. EXAM: PORTABLE CHEST 1 VIEW COMPARISON:  Radiographs of September 22, 2017. PET scan of January 28, 2018. FINDINGS: The heart size and mediastinal contours are within normal limits. Interval placement of right sided Port-A-Cath with distal tip in expected position of cavoatrial junction. No pneumothorax or pleural effusion is noted. Right lung is unremarkable. Ill-defined opacity is seen laterally in the left upper lobe consistent with neoplasm as described on prior CT scan. New nodular density seen in left lower lobe also concerning for possible neoplasm. The visualized skeletal structures are unremarkable. IMPRESSION: Left upper lobe density is noted consistent with history of malignancy. New nodular density is seen in left lower lobe which may represent metastatic disease. CT scan may be performed for further evaluation. Electronically Signed   By: Marijo Conception, M.D.   On: 04/02/2018 13:29     Assessment and plan- Patient is a 57 y.o. 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    She is status post 2 cycles of carboplatin Alimta and Keytruda and is here for post hospital discharge follow-up  1.  Stage IV lung cancer: I have personally reviewed CT chest abdomen and pelvis images independently and I have discussed findings with the patient.  Overall CT scan shows stable disease after 2 cycles of treatment.  I would ideally like to give her 2 more cycles of triplet treatment followed by repeat scans.  Her chemotherapy will be on hold today see reason below  2.  Acute on chronic hypoxic respiratory failure: Patient has multifactorial reasons for her shortness of breath.  Her baseline EF is 30%.  She also has underlying COPD and lung cancer.  She has chemo-induced anemia which is also making her shortness of breath worse.  CT chest also  shows evidence of airspace disease around her left upper lobe mass.  I will therefore treat her empirically for healthcare associated pneumonia and give her 7 days of Augmentin starting today.  Her chemo will  be on hold for this week  3.  Chemo induced anemia: Iron studies B12 and folate are normal.  Labs indicative of chemo induced anemia as her anemia particularly worsened after starting chemotherapy.  She does not require blood transfusion today.  Continue to monitor  4.  Hyperkalemia: Her potassium today is normal.  I will touch base with Dr. Holley Raring regarding restartingzirconium next week as I plan to restart [place] with close monitoring of her potassium levels  5. AKI: resolved. Continue to monitor   6.  Goals of care: I again discussed with the patient that she has stage IV lung cancer in addition to multiple comorbidities as outlined above.  Her performance status is 2 and goals of treatment currently are palliative.  Patient understands that her overall prognosis is poor and would like to be DNR/DNI.  Home palliative consult has also been initiated but we will continue chemotherapy at this time. MOST form has been updated and a copy of this is been given to the patient reflecting her wishes  7.  Neoplasm related pain: Continue OxyContin and oxycodone  I will see her back in 1 week's time with CBC CMP for cycle 3 of carboplatin and Alimta and plus minus Keytruda   Total face to face encounter time for this patient visit was 45 min. >50% of the time was  spent in counseling and coordination of care.     Visit Diagnosis 1. Goals of care, counseling/discussion   2. Healthcare-associated pneumonia   3. Malignant neoplasm of upper lobe of left lung ([place])   4. Neoplasm related pain   5. Hyperkalemia   6. Acute on chronic respiratory failure with hypoxia ([place])      Dr. Randa Evens, MD, MPH [facility] at [facility] [phone] 04/09/2018 11:30 AM

## 2018-04-09 NOTE — Telephone Encounter (Signed)
Called [facility] office and left message on his nurse line and left message to see what med patient should be on around her getting [place]. Wanted to know what med it is and how often to give it. Please call back

## 2018-04-16 ENCOUNTER — Inpatient Hospital Stay: Payer: Commercial Managed Care - PPO

## 2018-04-16 ENCOUNTER — Telehealth: Payer: Self-pay | Admitting: *Deleted

## 2018-04-16 ENCOUNTER — Inpatient Hospital Stay (HOSPITAL_BASED_OUTPATIENT_CLINIC_OR_DEPARTMENT_OTHER): Payer: Commercial Managed Care - PPO | Admitting: Oncology

## 2018-04-16 ENCOUNTER — Encounter: Payer: Self-pay | Admitting: Oncology

## 2018-04-16 VITALS — BP 120/62 | HR 107 | Temp 97.9°F | Resp 18 | Ht 64.0 in | Wt 169.0 lb

## 2018-04-16 DIAGNOSIS — C787 Secondary malignant neoplasm of liver and intrahepatic bile duct: Secondary | ICD-10-CM | POA: Diagnosis not present

## 2018-04-16 DIAGNOSIS — M7989 Other specified soft tissue disorders: Secondary | ICD-10-CM

## 2018-04-16 DIAGNOSIS — C3412 Malignant neoplasm of upper lobe, left bronchus or lung: Secondary | ICD-10-CM | POA: Diagnosis present

## 2018-04-16 DIAGNOSIS — E875 Hyperkalemia: Secondary | ICD-10-CM | POA: Diagnosis not present

## 2018-04-16 DIAGNOSIS — C7931 Secondary malignant neoplasm of brain: Secondary | ICD-10-CM | POA: Diagnosis not present

## 2018-04-16 DIAGNOSIS — C7951 Secondary malignant neoplasm of bone: Secondary | ICD-10-CM

## 2018-04-16 LAB — CBC WITH DIFFERENTIAL/PLATELET
Basophils Absolute: 0 10*3/uL (ref 0–0.1)
Basophils Relative: 0 %
EOS ABS: 0 10*3/uL (ref 0–0.7)
EOS PCT: 0 %
HCT: 26.3 % — ABNORMAL LOW (ref 35.0–47.0)
HEMOGLOBIN: 8.7 g/dL — AB (ref 12.0–16.0)
LYMPHS PCT: 12 %
Lymphs Abs: 1.5 10*3/uL (ref 1.0–3.6)
MCH: 28.8 pg (ref 26.0–34.0)
MCHC: 33.3 g/dL (ref 32.0–36.0)
MCV: 86.4 fL (ref 80.0–100.0)
MONOS PCT: 3 %
Monocytes Absolute: 0.3 10*3/uL (ref 0.2–0.9)
NEUTROS ABS: 11.2 10*3/uL — AB (ref 1.4–6.5)
Neutrophils Relative %: 85 %
PLATELETS: 524 10*3/uL — AB (ref 150–440)
RBC: 3.04 MIL/uL — AB (ref 3.80–5.20)
RDW: 18.6 % — ABNORMAL HIGH (ref 11.5–14.5)
WBC: 13 10*3/uL — AB (ref 3.6–11.0)

## 2018-04-16 LAB — COMPREHENSIVE METABOLIC PANEL
ALBUMIN: 2.2 g/dL — AB (ref 3.5–5.0)
ALT: 24 U/L (ref 0–44)
AST: 46 U/L — ABNORMAL HIGH (ref 15–41)
Alkaline Phosphatase: 296 U/L — ABNORMAL HIGH (ref 38–126)
Anion gap: 12 (ref 5–15)
BILIRUBIN TOTAL: 0.3 mg/dL (ref 0.3–1.2)
BUN: 24 mg/dL — ABNORMAL HIGH (ref 6–20)
CO2: 28 mmol/L (ref 22–32)
Calcium: 8.9 mg/dL (ref 8.9–10.3)
Chloride: 90 mmol/L — ABNORMAL LOW (ref 98–111)
Creatinine, Ser: 0.8 mg/dL (ref 0.44–1.00)
GFR calc Af Amer: >60 mL/min (ref >60)
GLUCOSE: 157 mg/dL — AB (ref 70–99)
POTASSIUM: 5.2 mmol/L — AB (ref 3.5–5.1)
Sodium: 130 mmol/L — ABNORMAL LOW (ref 135–145)
TOTAL PROTEIN: 8.1 g/dL (ref 6.5–8.1)

## 2018-04-16 MED ORDER — AMOXICILLIN-POT CLAVULANATE 875-125 MG PO TABS: 1.0000 | ORAL_TABLET | Freq: Two times a day (BID) | ORAL | 0 refills | Status: AC

## 2018-04-16 MED ORDER — HEPARIN SOD (PORK) LOCK FLUSH 100 UNIT/ML IV SOLN
500.0000 [IU] | Freq: Once | INTRAVENOUS | Status: AC
  Administered 2018-04-16: 500 [IU] via INTRAVENOUS

## 2018-04-16 NOTE — Progress Notes (Signed)
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 She smoked about 1-2 pack's per day for about 15 years. She says that currently she has not smoked for the last 1 week. She had been having ongoing symptoms of shortness of breath and was treated with oral antibiotics.  However her shortness of breath did not improve and this led to a chest x-ray followed by a CT scan in July 2019. CT chest showed a 6 x 6.1 x 4.3 cm left upper lobe mass concerning for malignancy along with left hilar and aortopulmonary window adenopathy. 10 mm nodule in the right middle lobe. Multiple sclerotic densities noted in the thoracic spine may represent metastatic disease.  This was followed by a PET/CT scan which showed 8.9 cm left upper lobe mass with an SUV of 16.9 along with adjacent precarinal and mediastinal adenopathy. Multiple hypermetabolic hepatic foci suspicious for metastatic disease. Right middle lobe pulmonary nodule not significantly hypermetabolic. Multiple osseous metastases within the left femoral neck with an SUV of 5.4 and a sclerotic right anterior acetabular region with an SUV of 9.5.  Patient lives alone and is independent of her ADLs and IADLs. She is not on any home oxygen. She does report  feeling fatigued and has lost about 15 pounds over the last 2 months. She reports poor appetite. Reports back pain which is radiating to her bilateral arms as well as radiating to her right lower extremity. She does not have any children and her only close family is her sister.  Ultrasound-guided liver biopsy showed adenocarcinoma consistent with lung origin.TTF1 positiveOmniseqtestingshowed KRAS mutation TMB high. PDL1 IHC 0%  MRI brain showed:IMPRESSION: 1. Moderately motion degraded examination. 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 Largest in RIGHT cerebellum measuring 22 mm. 2. 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 Old cerebellar, old LEFT thalamus and old LEFT caudate small infarcts. 3. Mild chronic small vessel ischemic changes.   Interval history- she feels better as compared to last week. Energy levels and sob is somewhat improved. Leg swelling persists.   ECOG PS- 2 Pain scale- 4 Opioid associated constipation- no  Review of systems- Review of Systems  Constitutional: Positive for malaise/fatigue. Negative for chills, fever and weight loss.  HENT: Negative for congestion, ear discharge and nosebleeds.   Eyes: Negative for blurred vision.  Respiratory: Positive for shortness of breath. Negative for cough, hemoptysis, sputum production and wheezing.   Cardiovascular: Positive for leg swelling. Negative for chest pain, palpitations, orthopnea and claudication.  Gastrointestinal: Negative for abdominal pain, blood in stool, constipation, diarrhea, heartburn, melena, nausea and vomiting.  Genitourinary: Negative for dysuria, flank pain, frequency, hematuria and urgency.  Musculoskeletal: Negative for back pain, joint pain and myalgias.  Skin: Negative for rash.  Neurological: Negative for dizziness, tingling, focal weakness, seizures, weakness and headaches.  Endo/Heme/Allergies: Does not  bruise/bleed easily.  Psychiatric/Behavioral: Negative for depression and suicidal ideas. The patient does not have insomnia.       Allergies  Allergen Reactions  . Other Shortness Of Breath    Beta blocker      Past Medical History:  Diagnosis Date  . Asthma   . CHF (congestive heart failure) ([place])   . COPD (chronic obstructive pulmonary disease) ([place])   . Diabetes mellitus without complication ([place])   . Hypercholesteremia   . Hypertension   . Lung cancer ([place])   . Reflux esophagitis      Past Surgical History:  Procedure Laterality Date  . IR IMAGING GUIDED PORT INSERTION  02/11/2018    Social History   Socioeconomic History  . Marital status: Single    Spouse name: Not on file  . Number of children: Not on file  . Years of education: Not on file  . Highest education level: Not on file  Occupational History  . Not on file  Social Needs  . Financial resource strain: Not hard at all  . Food insecurity:    Worry: Never true    Inability: Never true  . Transportation needs:    Medical: No    Non-medical: No  Tobacco Use  . Smoking status: Former Smoker    Last attempt to quit: 01/12/2018    Years since quitting: 0.2  . Smokeless tobacco: Never Used  Substance and Sexual Activity  . Alcohol use: Yes    Frequency: Never    Comment: socially  . Drug use: No  . Sexual activity: Not Currently    Birth control/protection: Abstinence  Lifestyle  . Physical activity:    Days per week: 0 days    Minutes per session: 0 min  . Stress: Not on file  Relationships  . Social connections:    Talks on phone: More than three times a week    Gets together: More than three times a week    Attends religious service: Patient refused    Active member of club or organization: No    Attends meetings of clubs or organizations: Never    Relationship status: Never married  . Intimate partner violence:    Fear of current or ex partner: No    Emotionally abused: No     Physically abused: No    Forced sexual activity: No  Other Topics Concern  . Not on file  Social History Narrative  . Not on file    Family History  Problem Relation Age of Onset  . Diabetes Mother      Current Outpatient Medications:  .  ADVAIR DISKUS 500-50 MCG/DOSE AEPB, Inhale 1 puff into the lungs every 12 (twelve) hours., Disp: , Rfl: 0 .  atorvastatin (LIPITOR) 40 MG tablet, Take 40 mg by mouth daily. , Disp: , Rfl:  .  fexofenadine (ALLEGRA) 180 MG tablet, Take 180 mg by mouth daily., Disp: , Rfl:  .  folic acid (FOLVITE) 1 MG tablet, Take 1 mg by mouth daily., Disp: , Rfl:  .  furosemide (LASIX) 20 MG tablet, Take 20 mg by mouth. PRN, Disp: , Rfl:  .  ipratropium-albuterol (DUONEB) 0.5-2.5 (3) MG/3ML SOLN, Take 3 mLs by nebulization every 4 (four) hours., Disp: 360 mL, Rfl: 0 .  montelukast (SINGULAIR) 10 MG tablet, Take 10 mg by mouth daily., Disp: , Rfl: 3 .  Nutritional Supplements (FEEDING SUPPLEMENT, NEPRO CARB STEADY,) LIQD, Take 237 mLs by mouth 3 (three) times daily between meals., Disp:  1000 mL, Rfl: 0 .  omeprazole (PRILOSEC) 20 MG capsule, Take 20 mg by mouth daily., Disp: , Rfl:  .  oxyCODONE (OXY IR/ROXICODONE) 5 MG immediate release tablet, Take 1 tablet (5 mg total) by mouth every 6 (six) hours as needed for severe pain., Disp: 60 tablet, Rfl: 0 .  oxyCODONE (OXYCONTIN) 10 mg 12 hr tablet, Take 1 tablet (10 mg total) by mouth every 12 (twelve) hours., Disp: 60 tablet, Rfl: 0 .  SPIRIVA HANDIHALER 18 MCG inhalation capsule, Place 1 puff into inhaler and inhale daily., Disp: , Rfl: 0 .  warfarin (COUMADIN) 6 MG tablet, Take 1 tablet (6 mg total) by mouth daily at 6 PM. TAKE 10 MG BY MOUTH ON Monday AND 7.5MG DAILY ALL OTHER DAYS (Patient taking differently: Take 5 mg by mouth daily at 6 PM. TAKE 10 MG BY MOUTH ON Monday AND 7.5MG DAILY ALL OTHER DAYS), Disp: 30 tablet, Rfl: 0 .  acetaminophen (TYLENOL) 500 MG tablet, Take 1,000 mg by mouth every 6 (six) hours as  needed., Disp: , Rfl:  .  amoxicillin-clavulanate (AUGMENTIN) 875-125 MG tablet, Take 1 tablet by mouth 2 (two) times daily., Disp: 14 tablet, Rfl: 0 .  diltiazem (CARDIZEM CD) 180 MG 24 hr capsule, Take 1 capsule (180 mg total) by mouth daily. (Patient not taking: Reported on 04/09/2018), Disp: 30 capsule, Rfl: 0 .  lidocaine-prilocaine (EMLA) cream, Apply to affected area once (Patient not taking: Reported on 02/18/2018), Disp: 30 g, Rfl: 3 .  ondansetron (ZOFRAN) 8 MG tablet, Take 1 tablet (8 mg total) by mouth every 8 (eight) hours as needed. (Patient not taking: Reported on 02/18/2018), Disp: 30 tablet, Rfl: 1 .  polyethylene glycol powder (GLYCOLAX/MIRALAX) powder, Take 17 g by mouth daily. (Patient not taking: Reported on 04/02/2018), Disp: 255 g, Rfl: 1 .  PROAIR HFA 108 (90 Base) MCG/ACT inhaler, Inhale 2 puffs into the lungs 4 (four) times daily as needed. (Patient not taking: Reported on 04/09/2018), Disp: 18 g, Rfl: 1 .  sodium zirconium cyclosilicate (LOKELMA) 10 g PACK packet, Take 10 g by mouth daily. (Patient not taking: Reported on 04/16/2018), Disp: 11 each, Rfl: 0 No current facility-administered medications for this visit.   Facility-Administered Medications Ordered in Other Visits:  .  heparin lock flush 100 unit/mL, 500 Units, Intravenous, Once, Randa Evens C, MD .  sodium chloride flush (NS) 0.9 % injection 10 mL, 10 mL, Intravenous, PRN, Sindy Guadeloupe, MD, 10 mL at 04/02/18 7846 .  sodium chloride flush (NS) 0.9 % injection 10 mL, 10 mL, Intravenous, PRN, Sindy Guadeloupe, MD, 10 mL at 04/09/18 9629 .  sodium polystyrene (KAYEXALATE) 15 GM/60ML suspension 30 g, 30 g, Oral, Once, Sindy Guadeloupe, MD  Physical exam:  Vitals:   04/16/18 0907 04/16/18 0909  BP:  120/62  Pulse:  (!) 107  Resp:  18  Temp:  97.9 F (36.6 C)  SpO2:  96%  Weight: 169 lb (76.7 kg) 169 lb (76.7 kg)  Height: 5' 4"  (1.626 m) 5' 4"  (1.626 m)   Physical Exam  Constitutional: She is oriented to person,  place, and time.  She is frail and sitting in a wheelchair.  Clinically she appears better this week as compared to last week  HENT:  Head: Normocephalic and atraumatic.  Eyes: Pupils are equal, round, and reactive to light. EOM are normal.  Neck: Normal range of motion.  Cardiovascular: Normal rate, regular rhythm and normal heart sounds.  Pulmonary/Chest: Effort normal and  breath sounds normal.  Abdominal: Soft. Bowel sounds are normal.  Musculoskeletal: She exhibits edema (b/l +2).  There is mild erythema noted over b/l legs in the inner aspect where they touch each other. No overt cellulitis  Neurological: She is alert and oriented to person, place, and time.  Skin: Skin is warm and dry.     CMP Latest Ref Rng & Units 04/16/2018  Glucose 70 - 99 mg/dL 157(H)  BUN 6 - 20 mg/dL 24(H)  Creatinine 0.44 - 1.00 mg/dL 0.80  Sodium 135 - 145 mmol/L 130(L)  Potassium 3.5 - 5.1 mmol/L 5.2(H)  Chloride 98 - 111 mmol/L 90(L)  CO2 22 - 32 mmol/L 28  Calcium 8.9 - 10.3 mg/dL 8.9  Total Protein 6.5 - 8.1 g/dL 8.1  Total Bilirubin 0.3 - 1.2 mg/dL 0.3  Alkaline Phos 38 - 126 U/L 296(H)  AST 15 - 41 U/L 46(H)  ALT 0 - 44 U/L 24   CBC Latest Ref Rng & Units 04/16/2018  WBC 3.6 - 11.0 K/uL 13.0(H)  Hemoglobin 12.0 - 16.0 g/dL 8.7(L)  Hematocrit 35.0 - 47.0 % 26.3(L)  Platelets 150 - 440 K/uL 524(H)    No images are attached to the encounter.  Dg Chest 2 View  Result Date: 04/04/2018 CLINICAL DATA:  Hx of lung CA, COPD, CHF, asthma Quit smoking 6-19 EXAM: CHEST - 2 VIEW COMPARISON:  04/02/2018 FINDINGS: Left suprahilar mass, poorly marginated on the frontal radiograph. Persistent patchy opacities in the mid and upper lung. Right lung clear. Heart size and mediastinal contours are within normal limits. Aortic Atherosclerosis (ICD10-170.0). Power injectable right IJ port catheter to the distal SVC. Blunting of left lateral costophrenic angle without definite effusion on the lateral radiograph.  Visualized bones unremarkable. IMPRESSION: Stable left pulmonary opacities.  No acute findings. Electronically Signed   By: Lucrezia Europe M.D.   On: 04/04/2018 09:43   Ct Chest W Contrast  Result Date: 04/06/2018 CLINICAL DATA:  Stage IV lung cancer with hepatic and osseous metastatic disease. Restaging post Keytruda treated therapy. Hyperkalemia. EXAM: CT CHEST, ABDOMEN, AND PELVIS WITH CONTRAST TECHNIQUE: Multidetector CT imaging of the chest, abdomen and pelvis was performed following the standard protocol during bolus administration of intravenous contrast. CONTRAST:  135m ISOVUE-300 IOPAMIDOL (ISOVUE-300) INJECTION 61% COMPARISON:  PET-CT 01/28/2018.  Chest CT 01/20/2018. FINDINGS: CT CHEST FINDINGS Cardiovascular: Suboptimal contrast bolus. No acute vascular findings are evident. There is atherosclerosis of the aorta, great vessels and coronary arteries. Right IJ Port-A-Cath extends to the level of the superior right atrium. The heart size is normal. There is no pericardial effusion. Mediastinum/Nodes: The previously demonstrated hypermetabolic adenopathy in the AP window and left hilum is partly obscured by adjacent left upper lobe airspace disease, although appears grossly stable. AP window node measures 14 mm on image 22/2. Hilar node measures 15 mm on image 24/2. No progressive thoracic adenopathy identified. Low-density thyroid nodules are stable. The esophagus appears unremarkable. Lungs/Pleura: New trace left pleural effusion. There is new mild dependent atelectasis in the left lower lobe. There is increased airspace disease surrounding the previously demonstrated dominant, hypermetabolic left upper lobe mass. This partially obscures the mass and limits its measurement. There are new central low-density components within the mass consistent with necrosis. There is posterior bowing of the left major fissure which is similar to the previous study. A perifissural 10 x 7 mm right middle lobe nodule on  image 71/4 has not significantly changed. There are other stable smaller right lung nodules, including a  6 mm right lower lobe nodule on image 57/4 and a 6 mm right lower lobe nodule on image 66/4. No new or enlarging nodules identified. There is mild centrilobular emphysema. Musculoskeletal/Chest wall: Multiple sclerotic metastases are identified within the spine, ribs, sternum and left scapula. These are more visible than on previous studies, likely due to at least partial treatment. No lytic lesion or pathologic fracture identified. There is a 13 mm soft tissue nodule in the right breast (image 32/2) which was mildly hypermetabolic on previous PET-CT. CT ABDOMEN AND PELVIS FINDINGS Hepatobiliary: Multiple ill-defined hypodensities are noted within the liver, most likely due to metastatic disease as correlated with previous PET-CT. The largest lesions measure 15 mm in the right lobe (image 62/2) and 14 mm in the left lobe (image 66/2). The lesions were not well seen on previous CT images, limiting comparison. Other more circumscribed low-density hepatic lesions are stable, likely incidental cysts. No evidence of gallstones, gallbladder wall thickening or biliary dilatation. Pancreas: Unremarkable. No pancreatic ductal dilatation or surrounding inflammatory changes. Spleen: Normal in size without focal abnormality. Adrenals/Urinary Tract: There is an ill-defined 1.6 x 2.5 cm low-density lesion lateral to the upper pole of the right kidney on image 52/2. This abuts the lateral limb of the right adrenal gland, but is probably separate from the adrenal gland. It appears unchanged from the previous studies and was not hypermetabolic on PET-CT. The left adrenal gland appears normal. As above, ill-defined low-density lesion along the upper pole the right kidney, likely a benign renal finding. In the lower pole of the left kidney, there is a stable thinly septated cyst. No hydronephrosis. The bladder appears normal.  Stomach/Bowel: No evidence of bowel wall thickening, distention or surrounding inflammatory change. No definite appendiceal thickening seen on the current study. Vascular/Lymphatic: There are stable small abdominal lymph nodes. There is mild aortic and branch vessel atherosclerosis. Reproductive: There is a large peripherally calcified uterine fibroid, measuring 6.8 cm on image 92/2. A crescentic shaped low-density 2.1 cm lesion along the posterior right aspect of the urethra (image 106/2) did not contain hypermetabolic activity on prior PET-CT, and therefore is not likely a urethral diverticulum. This may reflect an incidental vaginal cyst. Other: Small umbilical hernia containing only fat.  No ascites. Musculoskeletal: Multiple sclerotic osseous metastases within the lumbar spine, pelvis and proximal femurs consistent with at least partially treated metastatic disease. No lytic lesion or pathologic fracture identified. IMPRESSION: 1. Increased left upper lobe consolidation surrounding the dominant lung mass, limiting its measurement. The mass demonstrates low density consistent with necrosis. 2. The previously demonstrated hypermetabolic AP window and left hilar adenopathy is grossly stable in size, also with probable necrosis. 3. Stable indeterminate right lung nodules. 4. Multifocal hepatic metastatic disease is more evident on today CT images, likely due to interval treatment. 5. Likewise, the multifocal sclerotic osseous metastatic disease is more evident, also likely due to interval treatment. 6. Small indeterminate right breast nodule is unchanged in size, but was hypermetabolic on PET-CT. This would be atypical for metastatic disease, although could reflect a benign or malignant primary breast lesion. Attention on follow-up recommended. 7. Ill-defined lesion involving the upper pole the right kidney is grossly stable and not hypermetabolic on prior PET-CT, probably a complex cyst. Electronically Signed    By: Richardean Sale M.D.   On: 04/06/2018 12:35   Ct Abdomen Pelvis W Contrast  Result Date: 04/06/2018 CLINICAL DATA:  Stage IV lung cancer with hepatic and osseous metastatic disease. Restaging post Keytruda treated  therapy. Hyperkalemia. EXAM: CT CHEST, ABDOMEN, AND PELVIS WITH CONTRAST TECHNIQUE: Multidetector CT imaging of the chest, abdomen and pelvis was performed following the standard protocol during bolus administration of intravenous contrast. CONTRAST:  149m ISOVUE-300 IOPAMIDOL (ISOVUE-300) INJECTION 61% COMPARISON:  PET-CT 01/28/2018.  Chest CT 01/20/2018. FINDINGS: CT CHEST FINDINGS Cardiovascular: Suboptimal contrast bolus. No acute vascular findings are evident. There is atherosclerosis of the aorta, great vessels and coronary arteries. Right IJ Port-A-Cath extends to the level of the superior right atrium. The heart size is normal. There is no pericardial effusion. Mediastinum/Nodes: The previously demonstrated hypermetabolic adenopathy in the AP window and left hilum is partly obscured by adjacent left upper lobe airspace disease, although appears grossly stable. AP window node measures 14 mm on image 22/2. Hilar node measures 15 mm on image 24/2. No progressive thoracic adenopathy identified. Low-density thyroid nodules are stable. The esophagus appears unremarkable. Lungs/Pleura: New trace left pleural effusion. There is new mild dependent atelectasis in the left lower lobe. There is increased airspace disease surrounding the previously demonstrated dominant, hypermetabolic left upper lobe mass. This partially obscures the mass and limits its measurement. There are new central low-density components within the mass consistent with necrosis. There is posterior bowing of the left major fissure which is similar to the previous study. A perifissural 10 x 7 mm right middle lobe nodule on image 71/4 has not significantly changed. There are other stable smaller right lung nodules, including a 6 mm  right lower lobe nodule on image 57/4 and a 6 mm right lower lobe nodule on image 66/4. No new or enlarging nodules identified. There is mild centrilobular emphysema. Musculoskeletal/Chest wall: Multiple sclerotic metastases are identified within the spine, ribs, sternum and left scapula. These are more visible than on previous studies, likely due to at least partial treatment. No lytic lesion or pathologic fracture identified. There is a 13 mm soft tissue nodule in the right breast (image 32/2) which was mildly hypermetabolic on previous PET-CT. CT ABDOMEN AND PELVIS FINDINGS Hepatobiliary: Multiple ill-defined hypodensities are noted within the liver, most likely due to metastatic disease as correlated with previous PET-CT. The largest lesions measure 15 mm in the right lobe (image 62/2) and 14 mm in the left lobe (image 66/2). The lesions were not well seen on previous CT images, limiting comparison. Other more circumscribed low-density hepatic lesions are stable, likely incidental cysts. No evidence of gallstones, gallbladder wall thickening or biliary dilatation. Pancreas: Unremarkable. No pancreatic ductal dilatation or surrounding inflammatory changes. Spleen: Normal in size without focal abnormality. Adrenals/Urinary Tract: There is an ill-defined 1.6 x 2.5 cm low-density lesion lateral to the upper pole of the right kidney on image 52/2. This abuts the lateral limb of the right adrenal gland, but is probably separate from the adrenal gland. It appears unchanged from the previous studies and was not hypermetabolic on PET-CT. The left adrenal gland appears normal. As above, ill-defined low-density lesion along the upper pole the right kidney, likely a benign renal finding. In the lower pole of the left kidney, there is a stable thinly septated cyst. No hydronephrosis. The bladder appears normal. Stomach/Bowel: No evidence of bowel wall thickening, distention or surrounding inflammatory change. No definite  appendiceal thickening seen on the current study. Vascular/Lymphatic: There are stable small abdominal lymph nodes. There is mild aortic and branch vessel atherosclerosis. Reproductive: There is a large peripherally calcified uterine fibroid, measuring 6.8 cm on image 92/2. A crescentic shaped low-density 2.1 cm lesion along the posterior right aspect of  the urethra (image 106/2) did not contain hypermetabolic activity on prior PET-CT, and therefore is not likely a urethral diverticulum. This may reflect an incidental vaginal cyst. Other: Small umbilical hernia containing only fat.  No ascites. Musculoskeletal: Multiple sclerotic osseous metastases within the lumbar spine, pelvis and proximal femurs consistent with at least partially treated metastatic disease. No lytic lesion or pathologic fracture identified. IMPRESSION: 1. Increased left upper lobe consolidation surrounding the dominant lung mass, limiting its measurement. The mass demonstrates low density consistent with necrosis. 2. The previously demonstrated hypermetabolic AP window and left hilar adenopathy is grossly stable in size, also with probable necrosis. 3. Stable indeterminate right lung nodules. 4. Multifocal hepatic metastatic disease is more evident on today CT images, likely due to interval treatment. 5. Likewise, the multifocal sclerotic osseous metastatic disease is more evident, also likely due to interval treatment. 6. Small indeterminate right breast nodule is unchanged in size, but was hypermetabolic on PET-CT. This would be atypical for metastatic disease, although could reflect a benign or malignant primary breast lesion. Attention on follow-up recommended. 7. Ill-defined lesion involving the upper pole the right kidney is grossly stable and not hypermetabolic on prior PET-CT, probably a complex cyst. Electronically Signed   By: Richardean Sale M.D.   On: 04/06/2018 12:35   Dg Chest Port 1 View  Result Date: 04/02/2018 CLINICAL DATA:   Shortness of breath. EXAM: PORTABLE CHEST 1 VIEW COMPARISON:  Radiographs of September 22, 2017. PET scan of January 28, 2018. FINDINGS: The heart size and mediastinal contours are within normal limits. Interval placement of right sided Port-A-Cath with distal tip in expected position of cavoatrial junction. No pneumothorax or pleural effusion is noted. Right lung is unremarkable. Ill-defined opacity is seen laterally in the left upper lobe consistent with neoplasm as described on prior CT scan. New nodular density seen in left lower lobe also concerning for possible neoplasm. The visualized skeletal structures are unremarkable. IMPRESSION: Left upper lobe density is noted consistent with history of malignancy. New nodular density is seen in left lower lobe which may represent metastatic disease. CT scan may be performed for further evaluation. Electronically Signed   By: Marijo Conception, M.D.   On: 04/02/2018 13:29     Assessment and plan- Patient is a 57 y.o. 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  She is status post 2 cycles of carboplatin Alimta and Keytruda. She is here for ongoing evaluation of leg swellign and sob  1. Patient completed 7 days of augmentin yesterday. Clinically she looks better and her wbc is trending down. I will extend her antibiotics fo 1 more week. She has some erythema in her b/l LE. It may be the start of mild cellulitis and augmentin should help that as well  2.  I will hold off on starting chemotherapy today and re-evaluate her next week.  She continues to have significant anemia with a hemoglobin of 8.7 which is likely chemotherapy-induced.  Iron studies B12 and folate were normal.  I am will consider dose reducing carboplatin as well as Alimta with next cycle  3.  Mild hyperkalemia: Today her potassium is 5.2.  She has not received Keytruda for over 4 weeks now.  I will touch base with nephrology today to see if she warrants  zirconium at this time.  Repeat CBC CMP next week  4.  Bilateral leg edema: Likely secondary to hypoproteinemia as well as multiple comorbidities including baseline heart  failure with an EF of 30% and underlying malignancy.  Continue to monitor  5.  Neoplasm related pain: Continue OxyContin and oxycodone   Visit Diagnosis 1. Malignant neoplasm of upper lobe of left lung (HCC)   2. Leg swelling   3. Hyperkalemia      Dr. Randa Evens, MD, MPH [facility] at [facility] [phone] 04/16/2018 2:12 PM

## 2018-04-16 NOTE — Telephone Encounter (Signed)
Called patient's house and got her voicemail and left a message regarding starting the samples that she got from Dr. Precious Gilding office.  The medication is sodium zirconium it is a 10 g dose daily.  Dr. Zollie Scale says that she should take it once a day until he tells her to stop.  I left my telephone number to call me back on.  I then tried the patient's cell phone and she did answer.  I asked her about what kind of packets of the medicine she has, she says that her sisters picked it up and she does not have it with that her.  She will have to check with her sisters and call me back tomorrow to let me know if she got a 5 g packet or a 10 g packet and then I will go over the instructions of how to take it.  She will call me back sometime tomorrow between 9 and 10 AM

## 2018-04-16 NOTE — Telephone Encounter (Signed)
Patient left clinic with the port needle in.  Tried to run and get patient in parking lot and patient had already left with her family.  Called patient's cell phone messages are full cannot leave a message.  Called patient's home phone left a message.  Called both sisters phone 1 sister has no voicemail and the other sisters voicemail is full and cannot leave a message.  I hope patient gets her message so that she can come back and have her needle removed

## 2018-04-16 NOTE — Progress Notes (Signed)
Patient has question on lokema and when to start it

## 2018-04-16 NOTE — Telephone Encounter (Signed)
I got in touch with patient and she had went downstairs to the lab and have them DC her Port-A-Cath.  I told her over the phone that her appointment for next week is 10/ 4 on a Friday arrive at 8 AM for labs and I will also put it in the mail for her.  She is agreeable to the plan.

## 2018-04-22 ENCOUNTER — Other Ambulatory Visit: Payer: Self-pay | Admitting: *Deleted

## 2018-04-22 DIAGNOSIS — C3490 Malignant neoplasm of unspecified part of unspecified bronchus or lung: Secondary | ICD-10-CM

## 2018-04-23 ENCOUNTER — Other Ambulatory Visit: Payer: Self-pay | Admitting: Oncology

## 2018-04-23 MED ORDER — OXYCODONE HCL 5 MG PO TABS: 5.0000 mg | ORAL_TABLET | Freq: Four times a day (QID) | ORAL | 0 refills | Status: DC | PRN

## 2018-04-23 NOTE — Progress Notes (Signed)
Patient called [facility] center requesting refill of Oxycodone 5 mg q 6 hours PRN .   [place] Controlled Substance Reporting System reviewed and refill is appropriate on or after 04/13/18. Medication e-scribed to her pharmacy ([facility]) using [employer] 2-step verification process.    [place] reviewed:     Faythe Casa, NP 04/23/2018 12:57 PM [phone]

## 2018-04-24 ENCOUNTER — Inpatient Hospital Stay: Payer: Commercial Managed Care - PPO | Attending: Oncology

## 2018-04-24 ENCOUNTER — Inpatient Hospital Stay (HOSPITAL_BASED_OUTPATIENT_CLINIC_OR_DEPARTMENT_OTHER): Payer: Commercial Managed Care - PPO | Admitting: Oncology

## 2018-04-24 ENCOUNTER — Encounter: Payer: Self-pay | Admitting: Oncology

## 2018-04-24 ENCOUNTER — Inpatient Hospital Stay: Payer: Commercial Managed Care - PPO

## 2018-04-24 VITALS — BP 111/72 | HR 88 | Temp 95.7°F | Resp 18 | Ht 64.0 in | Wt 169.5 lb

## 2018-04-24 DIAGNOSIS — E875 Hyperkalemia: Secondary | ICD-10-CM

## 2018-04-24 DIAGNOSIS — C7951 Secondary malignant neoplasm of bone: Secondary | ICD-10-CM | POA: Insufficient documentation

## 2018-04-24 DIAGNOSIS — C787 Secondary malignant neoplasm of liver and intrahepatic bile duct: Secondary | ICD-10-CM | POA: Diagnosis not present

## 2018-04-24 DIAGNOSIS — C3412 Malignant neoplasm of upper lobe, left bronchus or lung: Secondary | ICD-10-CM | POA: Diagnosis not present

## 2018-04-24 DIAGNOSIS — C7931 Secondary malignant neoplasm of brain: Secondary | ICD-10-CM | POA: Diagnosis not present

## 2018-04-24 DIAGNOSIS — G893 Neoplasm related pain (acute) (chronic): Secondary | ICD-10-CM

## 2018-04-24 DIAGNOSIS — Z7189 Other specified counseling: Secondary | ICD-10-CM

## 2018-04-24 LAB — CBC WITH DIFFERENTIAL/PLATELET
BASOS ABS: 0 10*3/uL (ref 0–0.1)
BASOS PCT: 0 %
EOS ABS: 0 10*3/uL (ref 0–0.7)
Eosinophils Relative: 0 %
HCT: 25.3 % — ABNORMAL LOW (ref 35.0–47.0)
Hemoglobin: 8.5 g/dL — ABNORMAL LOW (ref 12.0–16.0)
Lymphocytes Relative: 12 %
Lymphs Abs: 1.4 10*3/uL (ref 1.0–3.6)
MCH: 29.1 pg (ref 26.0–34.0)
MCHC: 33.7 g/dL (ref 32.0–36.0)
MCV: 86.4 fL (ref 80.0–100.0)
Monocytes Absolute: 0.9 10*3/uL (ref 0.2–0.9)
Monocytes Relative: 8 %
NEUTROS PCT: 80 %
Neutro Abs: 8.8 10*3/uL — ABNORMAL HIGH (ref 1.4–6.5)
Platelets: 649 10*3/uL — ABNORMAL HIGH (ref 150–440)
RBC: 2.92 MIL/uL — ABNORMAL LOW (ref 3.80–5.20)
RDW: 20.9 % — ABNORMAL HIGH (ref 11.5–14.5)
WBC: 11.1 10*3/uL — AB (ref 3.6–11.0)

## 2018-04-24 LAB — COMPREHENSIVE METABOLIC PANEL
ALBUMIN: 2.1 g/dL — AB (ref 3.5–5.0)
ALK PHOS: 279 U/L — AB (ref 38–126)
ALT: 19 U/L (ref 0–44)
ANION GAP: 11 (ref 5–15)
AST: 43 U/L — AB (ref 15–41)
BILIRUBIN TOTAL: 0.3 mg/dL (ref 0.3–1.2)
BUN: 8 mg/dL (ref 6–20)
CALCIUM: 8.8 mg/dL — AB (ref 8.9–10.3)
CO2: 30 mmol/L (ref 22–32)
Chloride: 94 mmol/L — ABNORMAL LOW (ref 98–111)
Creatinine, Ser: 0.56 mg/dL (ref 0.44–1.00)
GFR calc Af Amer: >60 mL/min (ref >60)
GFR calc non Af Amer: >60 mL/min (ref >60)
GLUCOSE: 122 mg/dL — AB (ref 70–99)
Potassium: 3.9 mmol/L (ref 3.5–5.1)
SODIUM: 135 mmol/L (ref 135–145)
TOTAL PROTEIN: 7.1 g/dL (ref 6.5–8.1)

## 2018-04-24 MED ORDER — HEPARIN SOD (PORK) LOCK FLUSH 100 UNIT/ML IV SOLN
500.0000 [IU] | Freq: Once | INTRAVENOUS | Status: AC
  Administered 2018-04-24: 500 [IU] via INTRAVENOUS

## 2018-04-24 NOTE — Progress Notes (Signed)
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 She smoked about 1-2 pack's per day for about 15 years. She says that currently she has not smoked for the last 1 week. She had been having ongoing symptoms of shortness of breath and was treated with oral antibiotics.  However her shortness of breath did not improve and this led to a chest x-ray followed by a CT scan in July 2019. CT chest showed a 6 x 6.1 x 4.3 cm left upper lobe mass concerning for malignancy along with left hilar and aortopulmonary window adenopathy. 10 mm nodule in the right middle lobe. Multiple sclerotic densities noted in the thoracic spine may represent metastatic disease.  This was followed by a PET/CT scan which showed 8.9 cm left upper lobe mass with an SUV of 16.9 along with adjacent precarinal and mediastinal adenopathy. Multiple hypermetabolic hepatic foci suspicious for metastatic disease. Right middle lobe pulmonary nodule not significantly hypermetabolic. Multiple osseous metastases within the left femoral neck with an SUV of 5.4 and a sclerotic right anterior acetabular region with an SUV of 9.5.  Patient lives alone and is independent of her ADLs and IADLs. She is not on any home oxygen. She  does report feeling fatigued and has lost about 15 pounds over the last 2 months. She reports poor appetite. Reports back pain which is radiating to her bilateral arms as well as radiating to her right lower extremity. She does not have any children and her only close family is her sister.  Ultrasound-guided liver biopsy showed adenocarcinoma consistent with lung origin.TTF1 positiveOmniseqtestingshowed KRAS mutation TMB high. PDL1 IHC 0%  MRI brain showed:IMPRESSION: 1. Moderately motion degraded examination. 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 Largest in RIGHT cerebellum measuring 22 mm. 2. 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 Old cerebellar, old LEFT thalamus and old LEFT caudate small infarcts. 3. Mild chronic small vessel ischemic changes.   Interval history-shortness of breath is overall stable.  She continues to have bilateral lower extremity swelling and worsening redness of her extremities making it difficult for her to walk.  Overall since her last hospitalization patient has declined and does need assistance with her ADLs and IADLs.  She reports feeling fatigued.  She is spending more time in bed or in a recliner  ECOG PS- 3 Pain scale- 4 Opioid associated constipation- no  Review of systems- Review of Systems  Constitutional: Positive for malaise/fatigue and weight loss.  Respiratory: Positive for shortness of breath.   Cardiovascular: Positive for leg swelling.       Allergies  Allergen Reactions  . Other Shortness Of Breath    Beta blocker      Past Medical History:  Diagnosis Date  . Asthma   . CHF (congestive heart failure) ([place])   . COPD (chronic obstructive  pulmonary disease) ([place])   . Diabetes mellitus without complication ([place])   . Hypercholesteremia   . Hypertension   . Lung cancer ([place])   . Reflux esophagitis      Past Surgical History:  Procedure  Laterality Date  . IR IMAGING GUIDED PORT INSERTION  02/11/2018    Social History   Socioeconomic History  . Marital status: Single    Spouse name: Not on file  . Number of children: Not on file  . Years of education: Not on file  . Highest education level: Not on file  Occupational History  . Not on file  Social Needs  . Financial resource strain: Not hard at all  . Food insecurity:    Worry: Never true    Inability: Never true  . Transportation needs:    Medical: No    Non-medical: No  Tobacco Use  . Smoking status: Former Smoker    Last attempt to quit: 01/12/2018    Years since quitting: 0.2  . Smokeless tobacco: Never Used  Substance and Sexual Activity  . Alcohol use: Yes    Frequency: Never    Comment: socially  . Drug use: No  . Sexual activity: Not Currently    Birth control/protection: Abstinence  Lifestyle  . Physical activity:    Days per week: 0 days    Minutes per session: 0 min  . Stress: Not on file  Relationships  . Social connections:    Talks on phone: More than three times a week    Gets together: More than three times a week    Attends religious service: Patient refused    Active member of club or organization: No    Attends meetings of clubs or organizations: Never    Relationship status: Never married  . Intimate partner violence:    Fear of current or ex partner: No    Emotionally abused: No    Physically abused: No    Forced sexual activity: No  Other Topics Concern  . Not on file  Social History Narrative  . Not on file    Family History  Problem Relation Age of Onset  . Diabetes Mother      Current Outpatient Medications:  .  ADVAIR DISKUS 500-50 MCG/DOSE AEPB, Inhale 1 puff into the lungs every 12 (twelve) hours., Disp: , Rfl: 0 .  atorvastatin (LIPITOR) 40 MG tablet, Take 40 mg by mouth daily. , Disp: , Rfl:  .  fexofenadine (ALLEGRA) 180 MG tablet, Take 180 mg by mouth daily., Disp: , Rfl:  .  folic acid (FOLVITE) 1 MG  tablet, Take 1 mg by mouth daily., Disp: , Rfl:  .  furosemide (LASIX) 20 MG tablet, Take 20 mg by mouth. PRN, Disp: , Rfl:  .  ipratropium-albuterol (DUONEB) 0.5-2.5 (3) MG/3ML SOLN, Take 3 mLs by nebulization every 4 (four) hours., Disp: 360 mL, Rfl: 0 .  montelukast (SINGULAIR) 10 MG tablet, Take 10 mg by mouth daily., Disp: , Rfl: 3 .  Nutritional Supplements (FEEDING SUPPLEMENT, NEPRO CARB STEADY,) LIQD, Take 237 mLs by mouth 3 (three) times daily between meals., Disp: 1000 mL, Rfl: 0 .  omeprazole (PRILOSEC) 20 MG capsule, Take 20 mg by mouth daily., Disp: , Rfl:  .  oxyCODONE (OXY IR/ROXICODONE) 5 MG immediate release tablet, Take 1 tablet (5 mg total) by mouth every 6 (six) hours as needed for severe pain., Disp: 60 tablet, Rfl: 0 .  oxyCODONE (OXYCONTIN) 10 mg 12 hr tablet, Take 1  tablet (10 mg total) by mouth every 12 (twelve) hours., Disp: 60 tablet, Rfl: 0 .  SPIRIVA HANDIHALER 18 MCG inhalation capsule, Place 1 puff into inhaler and inhale daily., Disp: , Rfl: 0 .  warfarin (COUMADIN) 6 MG tablet, Take 1 tablet (6 mg total) by mouth daily at 6 PM. TAKE 10 MG BY MOUTH ON Monday AND 7.5MG DAILY ALL OTHER DAYS (Patient taking differently: Take 5 mg by mouth daily at 6 PM. TAKE 10 MG BY MOUTH ON Monday AND 7.5MG DAILY ALL OTHER DAYS), Disp: 30 tablet, Rfl: 0 .  acetaminophen (TYLENOL) 500 MG tablet, Take 1,000 mg by mouth every 6 (six) hours as needed., Disp: , Rfl:  .  amoxicillin-clavulanate (AUGMENTIN) 875-125 MG tablet, Take 1 tablet by mouth 2 (two) times daily. (Patient not taking: Reported on 04/24/2018), Disp: 14 tablet, Rfl: 0 .  diltiazem (CARDIZEM CD) 180 MG 24 hr capsule, Take 1 capsule (180 mg total) by mouth daily. (Patient not taking: Reported on 04/09/2018), Disp: 30 capsule, Rfl: 0 .  lidocaine-prilocaine (EMLA) cream, Apply to affected area once (Patient not taking: Reported on 02/18/2018), Disp: 30 g, Rfl: 3 .  ondansetron (ZOFRAN) 8 MG tablet, Take 1 tablet (8 mg total) by  mouth every 8 (eight) hours as needed. (Patient not taking: Reported on 02/18/2018), Disp: 30 tablet, Rfl: 1 .  polyethylene glycol powder (GLYCOLAX/MIRALAX) powder, Take 17 g by mouth daily. (Patient not taking: Reported on 04/02/2018), Disp: 255 g, Rfl: 1 .  PROAIR HFA 108 (90 Base) MCG/ACT inhaler, Inhale 2 puffs into the lungs 4 (four) times daily as needed. (Patient not taking: Reported on 04/09/2018), Disp: 18 g, Rfl: 1 .  sodium zirconium cyclosilicate (LOKELMA) 10 g PACK packet, Take 10 g by mouth daily. (Patient not taking: Reported on 04/16/2018), Disp: 11 each, Rfl: 0 No current facility-administered medications for this visit.   Facility-Administered Medications Ordered in Other Visits:  .  heparin lock flush 100 unit/mL, 500 Units, Intravenous, Once, Randa Evens C, MD .  sodium chloride flush (NS) 0.9 % injection 10 mL, 10 mL, Intravenous, PRN, Sindy Guadeloupe, MD, 10 mL at 04/02/18 7341 .  sodium chloride flush (NS) 0.9 % injection 10 mL, 10 mL, Intravenous, PRN, Sindy Guadeloupe, MD, 10 mL at 04/09/18 9379 .  sodium polystyrene (KAYEXALATE) 15 GM/60ML suspension 30 g, 30 g, Oral, Once, Sindy Guadeloupe, MD  Physical exam:  Vitals:   04/24/18 0849  BP: 111/72  Pulse: 88  Resp: 18  Temp: (!) 95.7 F (35.4 C)  TempSrc: Tympanic  SpO2: 96%  Weight: 169 lb 8 oz (76.9 kg)  Height: 5' 4"  (1.626 m)   Physical Exam  Constitutional: She is oriented to person, place, and time.  She is thin and fatigued.  Appears older than stated age.  Sitting in a wheelchair and appears short of breath  HENT:  Head: Normocephalic and atraumatic.  Eyes: Pupils are equal, round, and reactive to light. EOM are normal.  Neck: Normal range of motion.  Cardiovascular: Normal rate, regular rhythm and normal heart sounds.  Pulmonary/Chest: Effort normal and breath sounds normal.  Abdominal: Soft. Bowel sounds are normal.  Musculoskeletal: She exhibits edema (Bilateral +2 with diffuse erythema over the medial  aspect of both legs extending up to the knees worse than before).  Neurological: She is alert and oriented to person, place, and time.  Skin: Skin is warm and dry.     CMP Latest Ref Rng & Units 04/24/2018  Glucose  70 - 99 mg/dL 122(H)  BUN 6 - 20 mg/dL 8  Creatinine 0.44 - 1.00 mg/dL 0.56  Sodium 135 - 145 mmol/L 135  Potassium 3.5 - 5.1 mmol/L 3.9  Chloride 98 - 111 mmol/L 94(L)  CO2 22 - 32 mmol/L 30  Calcium 8.9 - 10.3 mg/dL 8.8(L)  Total Protein 6.5 - 8.1 g/dL 7.1  Total Bilirubin 0.3 - 1.2 mg/dL 0.3  Alkaline Phos 38 - 126 U/L 279(H)  AST 15 - 41 U/L 43(H)  ALT 0 - 44 U/L 19   CBC Latest Ref Rng & Units 04/24/2018  WBC 3.6 - 11.0 K/uL 11.1(H)  Hemoglobin 12.0 - 16.0 g/dL 8.5(L)  Hematocrit 35.0 - 47.0 % 25.3(L)  Platelets 150 - 440 K/uL 649(H)    No images are attached to the encounter.  Dg Chest 2 View  Result Date: 04/04/2018 CLINICAL DATA:  Hx of lung CA, COPD, CHF, asthma Quit smoking 6-19 EXAM: CHEST - 2 VIEW COMPARISON:  04/02/2018 FINDINGS: Left suprahilar mass, poorly marginated on the frontal radiograph. Persistent patchy opacities in the mid and upper lung. Right lung clear. Heart size and mediastinal contours are within normal limits. Aortic Atherosclerosis (ICD10-170.0). Power injectable right IJ port catheter to the distal SVC. Blunting of left lateral costophrenic angle without definite effusion on the lateral radiograph. Visualized bones unremarkable. IMPRESSION: Stable left pulmonary opacities.  No acute findings. Electronically Signed   By: Lucrezia Europe M.D.   On: 04/04/2018 09:43   Ct Chest W Contrast  Result Date: 04/06/2018 CLINICAL DATA:  Stage IV lung cancer with hepatic and osseous metastatic disease. Restaging post Keytruda treated therapy. Hyperkalemia. EXAM: CT CHEST, ABDOMEN, AND PELVIS WITH CONTRAST TECHNIQUE: Multidetector CT imaging of the chest, abdomen and pelvis was performed following the standard protocol during bolus administration of  intravenous contrast. CONTRAST:  151m ISOVUE-300 IOPAMIDOL (ISOVUE-300) INJECTION 61% COMPARISON:  PET-CT 01/28/2018.  Chest CT 01/20/2018. FINDINGS: CT CHEST FINDINGS Cardiovascular: Suboptimal contrast bolus. No acute vascular findings are evident. There is atherosclerosis of the aorta, great vessels and coronary arteries. Right IJ Port-A-Cath extends to the level of the superior right atrium. The heart size is normal. There is no pericardial effusion. Mediastinum/Nodes: The previously demonstrated hypermetabolic adenopathy in the AP window and left hilum is partly obscured by adjacent left upper lobe airspace disease, although appears grossly stable. AP window node measures 14 mm on image 22/2. Hilar node measures 15 mm on image 24/2. No progressive thoracic adenopathy identified. Low-density thyroid nodules are stable. The esophagus appears unremarkable. Lungs/Pleura: New trace left pleural effusion. There is new mild dependent atelectasis in the left lower lobe. There is increased airspace disease surrounding the previously demonstrated dominant, hypermetabolic left upper lobe mass. This partially obscures the mass and limits its measurement. There are new central low-density components within the mass consistent with necrosis. There is posterior bowing of the left major fissure which is similar to the previous study. A perifissural 10 x 7 mm right middle lobe nodule on image 71/4 has not significantly changed. There are other stable smaller right lung nodules, including a 6 mm right lower lobe nodule on image 57/4 and a 6 mm right lower lobe nodule on image 66/4. No new or enlarging nodules identified. There is mild centrilobular emphysema. Musculoskeletal/Chest wall: Multiple sclerotic metastases are identified within the spine, ribs, sternum and left scapula. These are more visible than on previous studies, likely due to at least partial treatment. No lytic lesion or pathologic fracture identified. There is  a 13 mm soft tissue nodule in the right breast (image 32/2) which was mildly hypermetabolic on previous PET-CT. CT ABDOMEN AND PELVIS FINDINGS Hepatobiliary: Multiple ill-defined hypodensities are noted within the liver, most likely due to metastatic disease as correlated with previous PET-CT. The largest lesions measure 15 mm in the right lobe (image 62/2) and 14 mm in the left lobe (image 66/2). The lesions were not well seen on previous CT images, limiting comparison. Other more circumscribed low-density hepatic lesions are stable, likely incidental cysts. No evidence of gallstones, gallbladder wall thickening or biliary dilatation. Pancreas: Unremarkable. No pancreatic ductal dilatation or surrounding inflammatory changes. Spleen: Normal in size without focal abnormality. Adrenals/Urinary Tract: There is an ill-defined 1.6 x 2.5 cm low-density lesion lateral to the upper pole of the right kidney on image 52/2. This abuts the lateral limb of the right adrenal gland, but is probably separate from the adrenal gland. It appears unchanged from the previous studies and was not hypermetabolic on PET-CT. The left adrenal gland appears normal. As above, ill-defined low-density lesion along the upper pole the right kidney, likely a benign renal finding. In the lower pole of the left kidney, there is a stable thinly septated cyst. No hydronephrosis. The bladder appears normal. Stomach/Bowel: No evidence of bowel wall thickening, distention or surrounding inflammatory change. No definite appendiceal thickening seen on the current study. Vascular/Lymphatic: There are stable small abdominal lymph nodes. There is mild aortic and branch vessel atherosclerosis. Reproductive: There is a large peripherally calcified uterine fibroid, measuring 6.8 cm on image 92/2. A crescentic shaped low-density 2.1 cm lesion along the posterior right aspect of the urethra (image 106/2) did not contain hypermetabolic activity on prior PET-CT, and  therefore is not likely a urethral diverticulum. This may reflect an incidental vaginal cyst. Other: Small umbilical hernia containing only fat.  No ascites. Musculoskeletal: Multiple sclerotic osseous metastases within the lumbar spine, pelvis and proximal femurs consistent with at least partially treated metastatic disease. No lytic lesion or pathologic fracture identified. IMPRESSION: 1. Increased left upper lobe consolidation surrounding the dominant lung mass, limiting its measurement. The mass demonstrates low density consistent with necrosis. 2. The previously demonstrated hypermetabolic AP window and left hilar adenopathy is grossly stable in size, also with probable necrosis. 3. Stable indeterminate right lung nodules. 4. Multifocal hepatic metastatic disease is more evident on today CT images, likely due to interval treatment. 5. Likewise, the multifocal sclerotic osseous metastatic disease is more evident, also likely due to interval treatment. 6. Small indeterminate right breast nodule is unchanged in size, but was hypermetabolic on PET-CT. This would be atypical for metastatic disease, although could reflect a benign or malignant primary breast lesion. Attention on follow-up recommended. 7. Ill-defined lesion involving the upper pole the right kidney is grossly stable and not hypermetabolic on prior PET-CT, probably a complex cyst. Electronically Signed   By: Richardean Sale M.D.   On: 04/06/2018 12:35   Ct Abdomen Pelvis W Contrast  Result Date: 04/06/2018 CLINICAL DATA:  Stage IV lung cancer with hepatic and osseous metastatic disease. Restaging post Keytruda treated therapy. Hyperkalemia. EXAM: CT CHEST, ABDOMEN, AND PELVIS WITH CONTRAST TECHNIQUE: Multidetector CT imaging of the chest, abdomen and pelvis was performed following the standard protocol during bolus administration of intravenous contrast. CONTRAST:  196m ISOVUE-300 IOPAMIDOL (ISOVUE-300) INJECTION 61% COMPARISON:  PET-CT  01/28/2018.  Chest CT 01/20/2018. FINDINGS: CT CHEST FINDINGS Cardiovascular: Suboptimal contrast bolus. No acute vascular findings are evident. There is atherosclerosis of the aorta, great vessels  and coronary arteries. Right IJ Port-A-Cath extends to the level of the superior right atrium. The heart size is normal. There is no pericardial effusion. Mediastinum/Nodes: The previously demonstrated hypermetabolic adenopathy in the AP window and left hilum is partly obscured by adjacent left upper lobe airspace disease, although appears grossly stable. AP window node measures 14 mm on image 22/2. Hilar node measures 15 mm on image 24/2. No progressive thoracic adenopathy identified. Low-density thyroid nodules are stable. The esophagus appears unremarkable. Lungs/Pleura: New trace left pleural effusion. There is new mild dependent atelectasis in the left lower lobe. There is increased airspace disease surrounding the previously demonstrated dominant, hypermetabolic left upper lobe mass. This partially obscures the mass and limits its measurement. There are new central low-density components within the mass consistent with necrosis. There is posterior bowing of the left major fissure which is similar to the previous study. A perifissural 10 x 7 mm right middle lobe nodule on image 71/4 has not significantly changed. There are other stable smaller right lung nodules, including a 6 mm right lower lobe nodule on image 57/4 and a 6 mm right lower lobe nodule on image 66/4. No new or enlarging nodules identified. There is mild centrilobular emphysema. Musculoskeletal/Chest wall: Multiple sclerotic metastases are identified within the spine, ribs, sternum and left scapula. These are more visible than on previous studies, likely due to at least partial treatment. No lytic lesion or pathologic fracture identified. There is a 13 mm soft tissue nodule in the right breast (image 32/2) which was mildly hypermetabolic on previous  PET-CT. CT ABDOMEN AND PELVIS FINDINGS Hepatobiliary: Multiple ill-defined hypodensities are noted within the liver, most likely due to metastatic disease as correlated with previous PET-CT. The largest lesions measure 15 mm in the right lobe (image 62/2) and 14 mm in the left lobe (image 66/2). The lesions were not well seen on previous CT images, limiting comparison. Other more circumscribed low-density hepatic lesions are stable, likely incidental cysts. No evidence of gallstones, gallbladder wall thickening or biliary dilatation. Pancreas: Unremarkable. No pancreatic ductal dilatation or surrounding inflammatory changes. Spleen: Normal in size without focal abnormality. Adrenals/Urinary Tract: There is an ill-defined 1.6 x 2.5 cm low-density lesion lateral to the upper pole of the right kidney on image 52/2. This abuts the lateral limb of the right adrenal gland, but is probably separate from the adrenal gland. It appears unchanged from the previous studies and was not hypermetabolic on PET-CT. The left adrenal gland appears normal. As above, ill-defined low-density lesion along the upper pole the right kidney, likely a benign renal finding. In the lower pole of the left kidney, there is a stable thinly septated cyst. No hydronephrosis. The bladder appears normal. Stomach/Bowel: No evidence of bowel wall thickening, distention or surrounding inflammatory change. No definite appendiceal thickening seen on the current study. Vascular/Lymphatic: There are stable small abdominal lymph nodes. There is mild aortic and branch vessel atherosclerosis. Reproductive: There is a large peripherally calcified uterine fibroid, measuring 6.8 cm on image 92/2. A crescentic shaped low-density 2.1 cm lesion along the posterior right aspect of the urethra (image 106/2) did not contain hypermetabolic activity on prior PET-CT, and therefore is not likely a urethral diverticulum. This may reflect an incidental vaginal cyst. Other:  Small umbilical hernia containing only fat.  No ascites. Musculoskeletal: Multiple sclerotic osseous metastases within the lumbar spine, pelvis and proximal femurs consistent with at least partially treated metastatic disease. No lytic lesion or pathologic fracture identified. IMPRESSION: 1. Increased left upper  lobe consolidation surrounding the dominant lung mass, limiting its measurement. The mass demonstrates low density consistent with necrosis. 2. The previously demonstrated hypermetabolic AP window and left hilar adenopathy is grossly stable in size, also with probable necrosis. 3. Stable indeterminate right lung nodules. 4. Multifocal hepatic metastatic disease is more evident on today CT images, likely due to interval treatment. 5. Likewise, the multifocal sclerotic osseous metastatic disease is more evident, also likely due to interval treatment. 6. Small indeterminate right breast nodule is unchanged in size, but was hypermetabolic on PET-CT. This would be atypical for metastatic disease, although could reflect a benign or malignant primary breast lesion. Attention on follow-up recommended. 7. Ill-defined lesion involving the upper pole the right kidney is grossly stable and not hypermetabolic on prior PET-CT, probably a complex cyst. Electronically Signed   By: Richardean Sale M.D.   On: 04/06/2018 12:35   Dg Chest Port 1 View  Result Date: 04/02/2018 CLINICAL DATA:  Shortness of breath. EXAM: PORTABLE CHEST 1 VIEW COMPARISON:  Radiographs of September 22, 2017. PET scan of January 28, 2018. FINDINGS: The heart size and mediastinal contours are within normal limits. Interval placement of right sided Port-A-Cath with distal tip in expected position of cavoatrial junction. No pneumothorax or pleural effusion is noted. Right lung is unremarkable. Ill-defined opacity is seen laterally in the left upper lobe consistent with neoplasm as described on prior CT scan. New nodular density seen in left lower lobe also  concerning for possible neoplasm. The visualized skeletal structures are unremarkable. IMPRESSION: Left upper lobe density is noted consistent with history of malignancy. New nodular density is seen in left lower lobe which may represent metastatic disease. CT scan may be performed for further evaluation. Electronically Signed   By: Marijo Conception, M.D.   On: 04/02/2018 13:29     Assessment and plan- Patient is a 57 y.o. female adenocarcinoma of the lung stage IV CT3N2M1B with metastases to the liver brain.She is status post 2 cycles of carboplatin Alimta and Keytruda.  She is here for on treatment assessment prior to cycle 3 of carboplatin Alimta and Keytruda  Patient was given 2 weeks of antibiotics to see if her breathing and performance status improves.  Overall since her last hospitalization her condition has worsened.  She continues to have shortness of breath as well as worsening lower extremity swelling.  She is spending more time in the bed or recliner and hardly getting out of bed.  She does require assistance for ADLs and IADLs.  I discussed that more chemotherapy could potentially worsen her quality of life and may add a few more months to her longevity but that would not be with quality.  Discussed that pursuing hospice would be reasonable at this time given her multiple comorbidities including her underlying cardiac status with an EF of 35%, underlying COPD as well as advanced lung cancer.  Her overall prognosis is poor and less than 3 months.  Patient and family understand the overall picture and would not like to continue with treatment at this time.  She has already completed a most form and is a DNR/DNI.  We will make hospice referral at this time  She will continue to be on OxyContin and oxycodone for her neoplasm related pain which can be adjusted by hospice based on her needs.  I have asked her to stop taking zirocnium for her hyperkalemia as she is no longer going to be receiving  Keytruda.  Also her potassium is currently  normal.  This medication is unlikely to be covered by hospice as well.  With regards to her INR testing she has remained in a stable dose of Coumadin and now that she is proceeding with hospice and has an overall poor prognosis it may not be worthwhile to continue checking her INR as well.  Patient is in understanding of the plan.  I will continue to oversee her care through hospice and she can reach out to [place] if she has any questions or concerns in the future   Total face to face encounter time for this patient visit was 45 min. >50% of the time was  spent in counseling and coordination of care.     Visit Diagnosis 1. Goals of care, counseling/discussion   2. Malignant neoplasm of upper lobe of left lung ([place])   3. Neoplasm related pain   4. Hyperkalemia      Dr. Randa Evens, MD, MPH [facility] [phone] 04/24/2018 2:52 PM

## 2018-04-28 ENCOUNTER — Telehealth: Payer: Self-pay | Admitting: *Deleted

## 2018-04-28 NOTE — Telephone Encounter (Signed)
I have sent message to [employer] about who ordered the original order for oxygen. I do not remember setting it up. Will wait for his call back

## 2018-04-28 NOTE — Telephone Encounter (Signed)
Patient / family requesting a small portable O2 tank (Helios type) Hospice does not cover her DME  And [facility] who supplies her O2 needs an order for the small portable O2 concentrator

## 2018-04-29 NOTE — Telephone Encounter (Signed)
I received message from [place] that they are providing her oxygen and they do not carry helios system so there is no way they can get it through [facility]) [facility].  I called triage at hospice and they do not contract with [facility] They use [facility] and per pt;s insurance being [facility] they have to go through insurance and they say it has to go through [employer].  Hospice even if her insurance was different they never pay for Helios system.  I tried calling sisters house and she was not there.  I called other sister and the voicemail was full. I called the patient's house and her sister answered the phone and I told her the above. She will tell patient and call back if they have any  More questions.  I told them they may want to check with insurance to see if helios is covered with insurance at all based on the above info no one above provides it.

## 2018-05-05 ENCOUNTER — Telehealth: Payer: Self-pay | Admitting: *Deleted

## 2018-05-05 NOTE — Telephone Encounter (Signed)
Call return to [facility] and VO for Lasix if patient can tolerate without dizziness given and repeated back

## 2018-05-05 NOTE — Telephone Encounter (Signed)
Hospice nurse called and reports that patient legs and feet are swollen and is asking if you want to increase her Lasix dose. Please advise

## 2018-05-05 NOTE — Telephone Encounter (Signed)
She can try 40 mg lasix daily prn if she can tolerate without feeling dizzy

## 2018-05-06 ENCOUNTER — Other Ambulatory Visit: Payer: Self-pay | Admitting: Nurse Practitioner

## 2018-05-06 ENCOUNTER — Telehealth: Payer: Self-pay | Admitting: *Deleted

## 2018-05-06 DIAGNOSIS — C3490 Malignant neoplasm of unspecified part of unspecified bronchus or lung: Secondary | ICD-10-CM

## 2018-05-06 MED ORDER — OXYCODONE HCL 5 MG PO TABS: 10.0000 mg | ORAL_TABLET | Freq: Four times a day (QID) | ORAL | 0 refills | Status: AC | PRN

## 2018-05-06 NOTE — Telephone Encounter (Signed)
[place] informed of dose change and prescription sent

## 2018-05-06 NOTE — Progress Notes (Signed)
Per request, pain medication adjusted. Increased short acting oxycodone to 10 mg. Continue oxycontin extended release as prescribed.

## 2018-05-06 NOTE — Telephone Encounter (Signed)
Hospice nurse reports patient has uncontrolled pain. Asking for medicine change. She is currently on Oxycodone ER 10 mg every 12 hours and Oxycodone 5 mg every 6 hours as needed. Please advise

## 2018-05-06 NOTE — Telephone Encounter (Signed)
Short acting pain medication increased and new prescription sent to pharmacy.

## 2018-05-22 DEATH — deceased

## 2019-09-23 IMAGING — US IR FLUORO GUIDE CV LINE*L*
1 series · 2 of 2 positions shown · non-contrast
Comparison: none

CLINICAL DATA: Lung mass

[Series 1: ir fluoro guide cv line*left* · 2 of 2 slices shown]
[im 1/2]
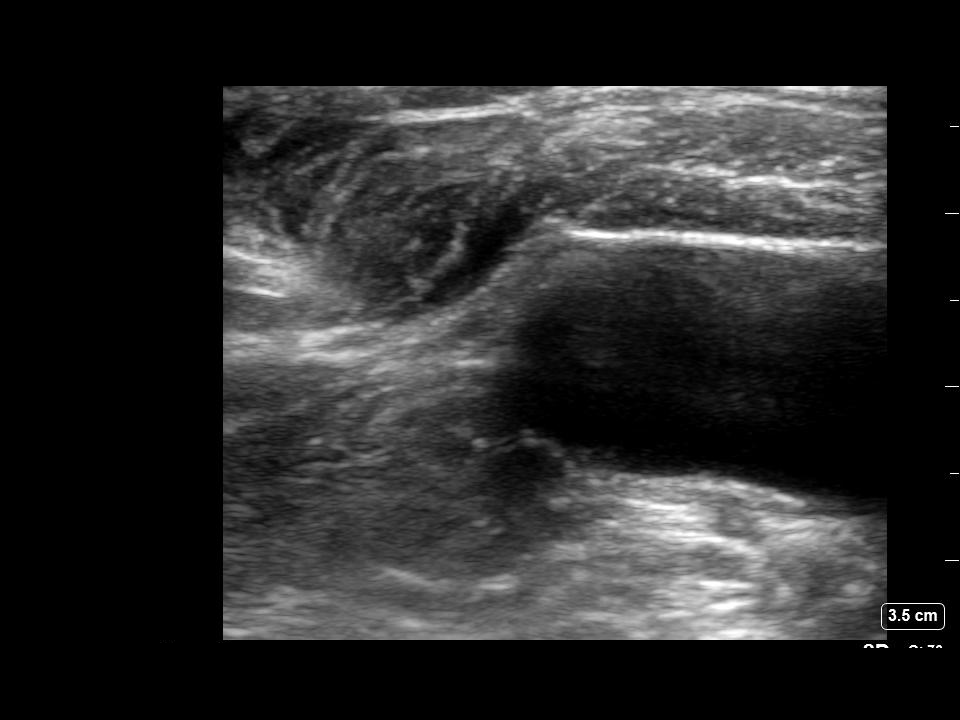
[im 2/2]
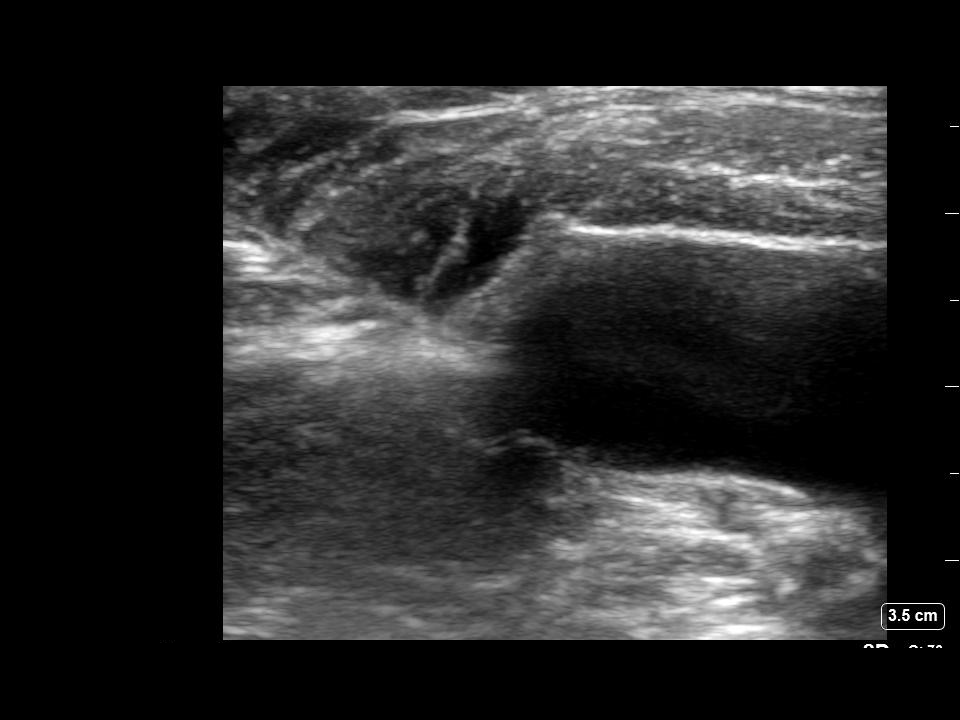

[2 of 2 positions shown; findings below may reference images not displayed]

EXAM:
TUNNEL POWER PORT PLACEMENT WITH SUBCUTANEOUS POCKET UTILIZING
ULTRASOUND & FLOUROSCOPY

FLUOROSCOPY TIME:  24 seconds.  Four mGy.

MEDICATIONS AND MEDICAL HISTORY:
Versed 2 mg, Fentanyl 75 mcg.

Additional Medications: Ancef 2 g. Antibiotics were given within 2
hours of the procedure.

ANESTHESIA/SEDATION:
Moderate sedation time: 34 minutes. Nursing monitored the the
patient during the procedure.

PROCEDURE:
After written informed consent was obtained, patient was placed in
the supine position on angiographic table. The right neck and chest
was prepped and draped in a sterile fashion. Lidocaine was utilized
for local anesthesia. The right jugular vein was noted to be patent
initially with ultrasound. Under sonographic guidance, a
micropuncture needle was inserted into the right IJ vein (Ultrasound
and fluoroscopic image documentation was performed). The needle was
removed over an 018 wire which was exchanged for a Amplatz. This was
advanced into the IVC. An 8-French dilator was advanced over the
Amplatz.

A small incision was made in the right upper chest over the anterior
right second rib. Utilizing blunt dissection, a subcutaneous pocket
was created in the caudal direction. The pocket was irrigated with a
copious amount of sterile normal saline. The port catheter was
tunneled from the chest incision, and out the neck incision. The
reservoir was inserted into the subcutaneous pocket and secured with
two 3-0 Ethilon stitches. A peel-away sheath was advanced over the
Amplatz wire. The port catheter was cut to measure length and
inserted through the peel-away sheath. The peel-away sheath was
removed. The chest incision was closed with 3-0 Vicryl interrupted
stitches for the subcutaneous tissue and a running of 4-0 Vicryl
subcuticular stitch for the skin. The neck incision was closed with
a 4-0 Vicryl subcuticular stitch. Derma-bond was applied to both
surgical incisions. The port reservoir was flushed and instilled
with heparinized saline. No complications.
FINDINGS: A right IJ vein Port-A-Cath is in place with its tip at the
cavoatrial junction.

COMPLICATIONS:
None
IMPRESSION: Successful 8 French right internal jugular vein power port placement
with its tip at the SVC/RA junction.

## 2019-09-25 IMAGING — MR MR HEAD WO/W CM
13 series · 48 of 48 positions shown · IV contrast (multihance)
Comparison: PET-CT January 28, 2018 and CT HEAD October 23, 2014.

CLINICAL DATA: Metastatic lung cancer. History of hypertension,
hypercholesterolemia.

EXAM:
MRI HEAD WITHOUT AND WITH CONTRAST
TECHNIQUE: Multiplanar, multiecho pulse sequences of the brain and surrounding
structures were obtained without and with intravenous contrast.
CONTRAST:  10mL MULTIHANCE GADOBENATE DIMEGLUMINE 529 MG/ML IV SOLN

[Series 2: T1 · sagittal · 5.0mm · 0.45mm/px · 1 of 27 slices shown (1 of 2)]
[im 1/27]
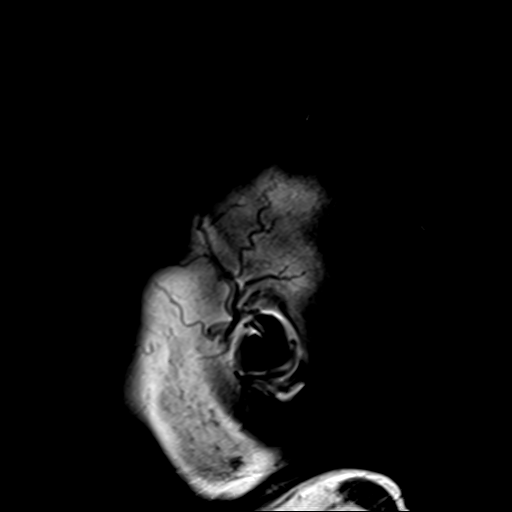

[Series 4: DWI · axial · 3.0mm · 1.80mm/px · z∈[-57,+104]mm · 3 of 55 slices shown (1 of 4)]
[im 1/55]
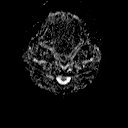
[im 28/55]
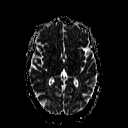
[im 55/55]
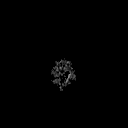

[Series 6: DWI · coronal · 3.0mm · 1.80mm/px · 3 of 47 slices shown (2 of 4)]
[im 1/47]
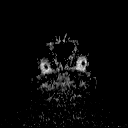
[im 24/47]
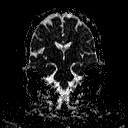
[im 47/47]
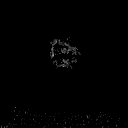

[Series 7: T2 · axial · 5.0mm · 0.60mm/px · z∈[-54,+102]mm · 2 of 25 slices shown (1 of 2)]
[im 1/25]
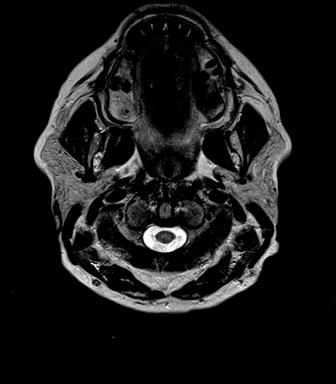
[im 25/25]
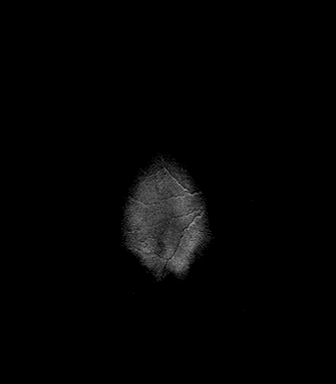

[Series 8: FLAIR · axial · 3.0mm · 0.45mm/px · z∈[-54,+102]mm · 3 of 53 slices shown]
[im 1/53]
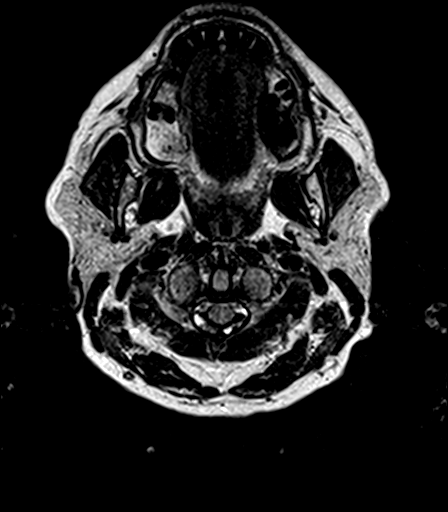
[im 27/53]
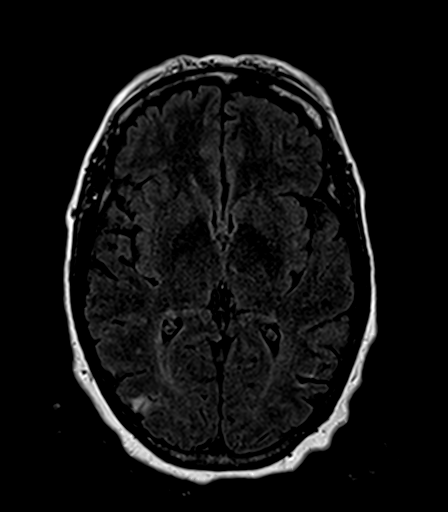
[im 53/53]
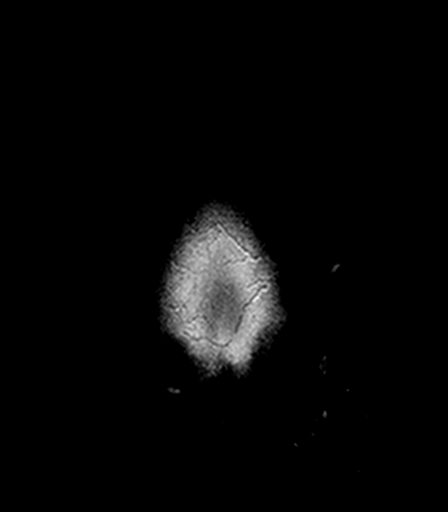

[Series 10: T1 · axial · 1.0mm · 1.00mm/px · z∈[-63,+112]mm · 11 of 176 slices shown (2 of 2)]
[im 1/176]
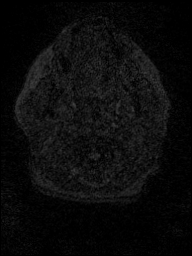
[im 18/176]
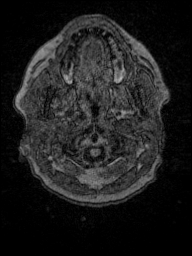
[im 36/176]
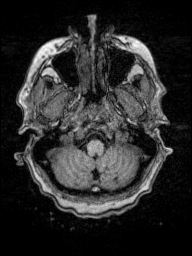
[im 53/176]
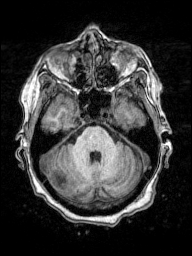
[im 71/176]
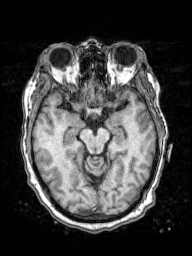
[im 88/176]
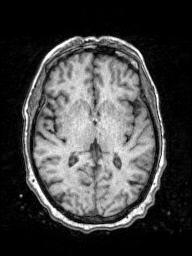
[im 106/176]
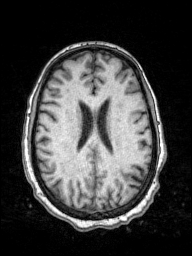
[im 123/176]
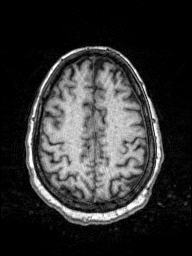
[im 141/176]
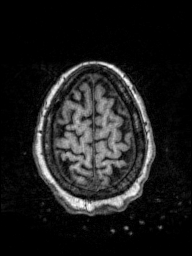
[im 158/176]
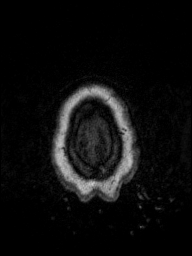
[im 176/176]
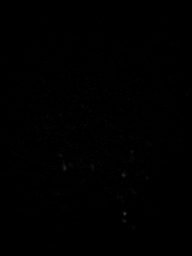

[Series 11: T2 · axial · 5.0mm · 0.45mm/px · z∈[-54,+102]mm · 2 of 25 slices shown (2 of 2)]
[im 1/25]
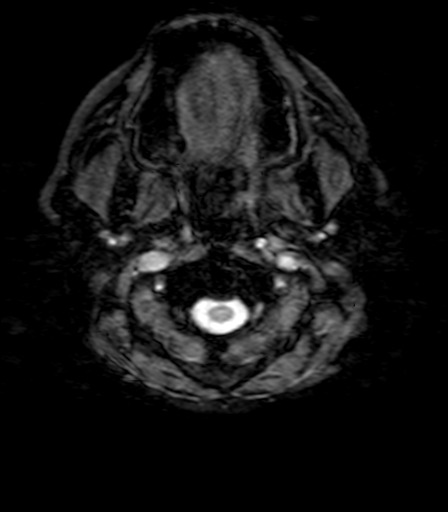
[im 25/25]
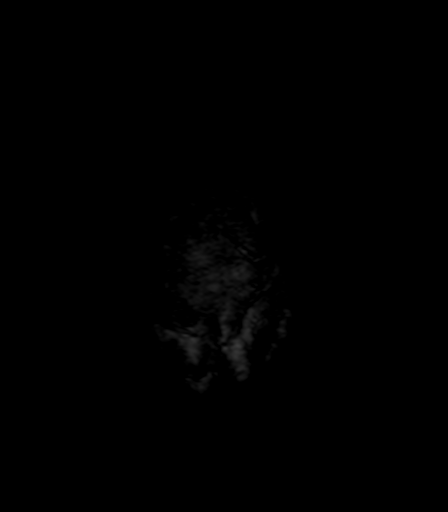

[Series 12: T2 post-contrast · coronal · 5.0mm · 0.49mm/px · 2 of 29 slices shown]
[im 1/29]
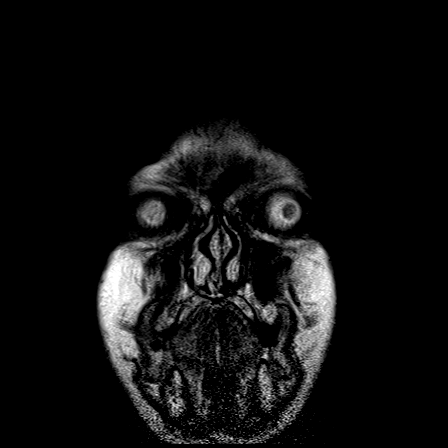
[im 29/29]
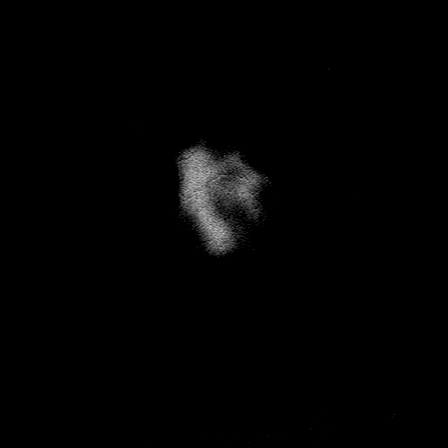

[Series 13: T1 post-contrast · axial · 1.0mm · 1.00mm/px · z∈[-63,+112]mm · 11 of 176 slices shown (1 of 3)]
[im 1/176]
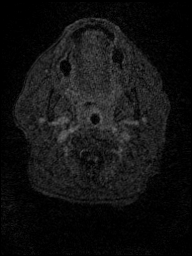
[im 18/176]
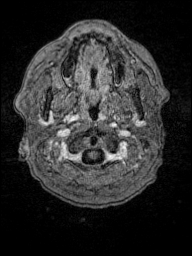
[im 36/176]
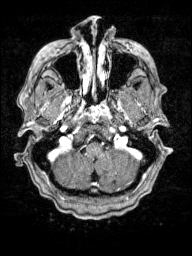
[im 53/176]
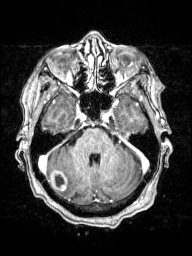
[im 71/176]
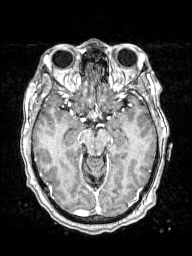
[im 88/176]
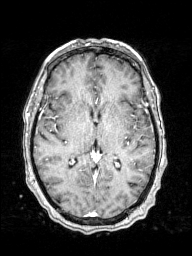
[im 106/176]
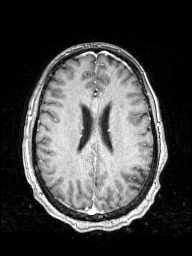
[im 123/176]
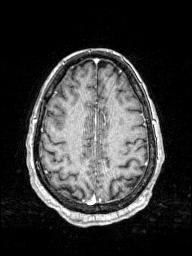
[im 141/176]
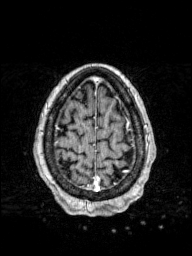
[im 158/176]
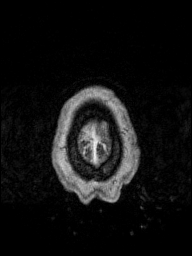
[im 176/176]
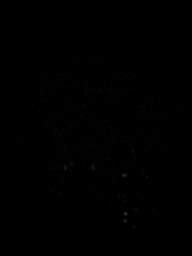

[Series 14: T1 post-contrast · coronal · 5.0mm · 0.43mm/px · 2 of 29 slices shown (2 of 3)]
[im 1/29]
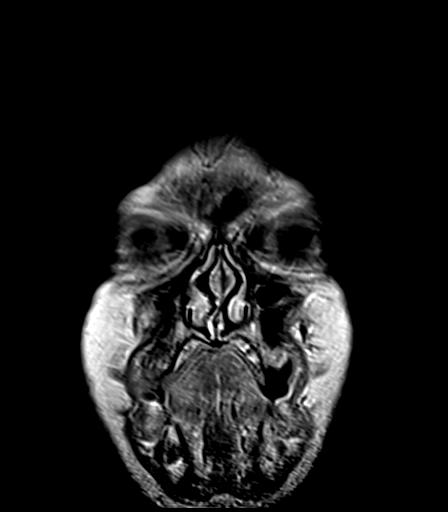
[im 29/29]
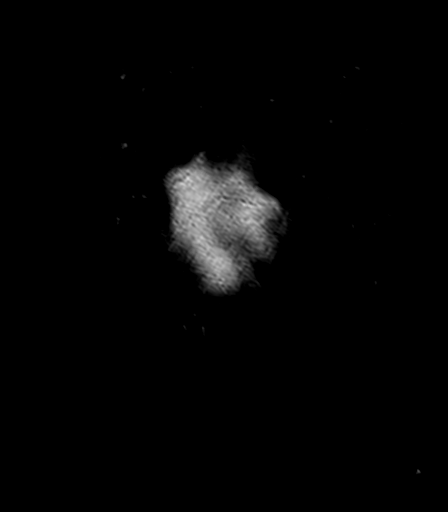

[Series 15: T1 post-contrast · sagittal · 5.0mm · 0.45mm/px · 2 of 27 slices shown (3 of 3)]
[im 1/27]
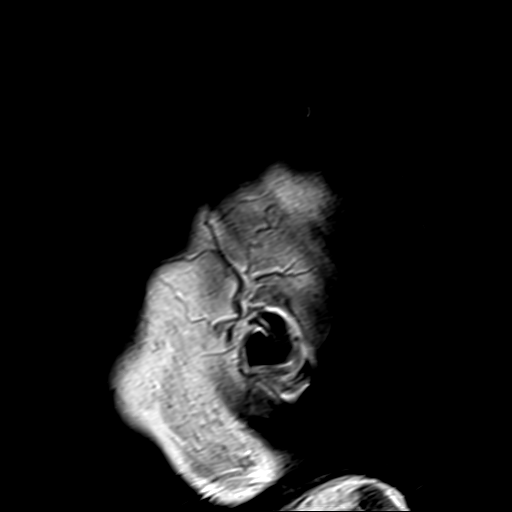
[im 27/27]
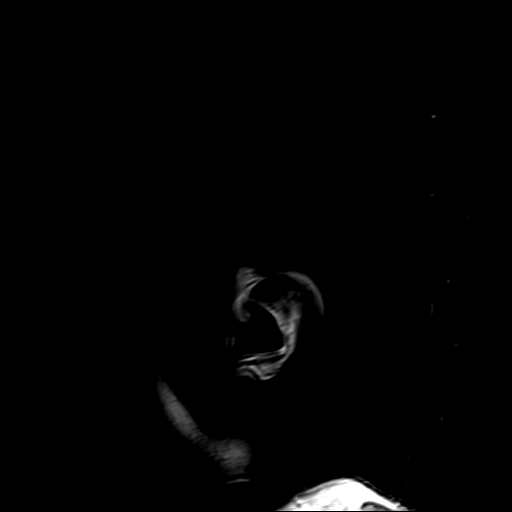

[Series 100: DWI · axial · 3.0mm · 1.80mm/px · z∈[-45,+104]mm · 3 of 50 slices shown (3 of 4)]
[im 1/50]
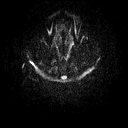
[im 25/50]
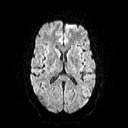
[im 50/50]
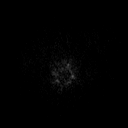

[Series 101: DWI · coronal · 3.0mm · 1.80mm/px · 3 of 47 slices shown (4 of 4)]
[im 1/47]
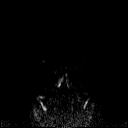
[im 24/47]
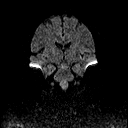
[im 47/47]
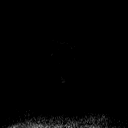

[48 of 48 positions shown; findings below may reference images not displayed]

FINDINGS: Moderately motion degraded examination.

INTRACRANIAL CONTENTS: 4 enhancing lesions with proportional FLAIR
T2 hyperintense vasogenic edema as follows:

RIGHT cerebellum 17 x 22 mm (series 13, image 54),

RIGHT cerebellum 6 x 9 mm (series 13, image 59),

Pituitary infundibulum 6 x 8 mm (series 13, image 69),

LEFT temporal lobe 9 x 10 mm (series 13, image 58).

No midline shift or significant mass effect. LEFT frontal,
biparietal lobe encephalomalacia. Old small bilateral cerebellar
infarcts. Old small LEFT thalamus and LEFT caudate infarcts. Patchy
supratentorial white matter FLAIR T2 hyperintensities. No abnormal
extra-axial fluid collections.

VASCULAR: Normal major intracranial vascular flow voids present at
skull base.

SKULL AND UPPER CERVICAL SPINE: No abnormal sellar expansion. No
suspicious calvarial bone marrow signal. Craniocervical junction
maintained.

SINUSES/ORBITS: RIGHT sphenoid sinus mucosal retention cyst. No
paranasal sinus air-fluid levels. Mastoid air cells are well
aerated.The included ocular globes and orbital contents are
non-suspicious.

OTHER: None.
IMPRESSION: 1. Moderately motion degraded examination. Four intracranial
metastasis including pituitary infundibulum. Largest in RIGHT
cerebellum measuring 22 mm.
2. LEFT frontal and biparietal encephalomalacia most consistent with
old MCA territory infarcts. Old cerebellar, old LEFT thalamus and
old LEFT caudate small infarcts.
3. Mild chronic small vessel ischemic changes.
4. These results will be called to the ordering clinician or
representative by the Radiologist Assistant, and communication
documented in the PACS or zVision Dashboard.
